# Patient Record
Sex: Male | Born: 1995
Health system: Southern US, Community
[De-identification: ages and names within clinical notes are randomized; demographics above are authoritative.]

## PROBLEM LIST (undated history)

## (undated) DIAGNOSIS — F329 Major depressive disorder, single episode, unspecified: Secondary | ICD-10-CM

## (undated) DIAGNOSIS — F209 Schizophrenia, unspecified: Secondary | ICD-10-CM

## (undated) DIAGNOSIS — D571 Sickle-cell disease without crisis: Secondary | ICD-10-CM

## (undated) DIAGNOSIS — F419 Anxiety disorder, unspecified: Secondary | ICD-10-CM

## (undated) DIAGNOSIS — K8020 Calculus of gallbladder without cholecystitis without obstruction: Secondary | ICD-10-CM

## (undated) HISTORY — PX: CHOLECYSTECTOMY: SHX55

## (undated) HISTORY — PX: ADENOIDECTOMY: SUR15

## (undated) HISTORY — PX: TONSILLECTOMY: SUR1361

---
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
## 2013-01-02 LAB — HEMOGLOBINOPATHY EVALUATION
Hemoglobin Other: 0 %
Hgb A: 0 % — ABNORMAL LOW (ref 96.8–97.8)
Hgb S Quant: 94.4 % — ABNORMAL HIGH

## 2013-01-08 ENCOUNTER — Encounter: Payer: Self-pay | Admitting: Internal Medicine

## 2013-01-08 ENCOUNTER — Ambulatory Visit (INDEPENDENT_AMBULATORY_CARE_PROVIDER_SITE_OTHER): Payer: BC Managed Care – PPO | Admitting: Internal Medicine

## 2013-01-08 VITALS — BP 110/60 | HR 70 | Temp 98.5°F | Resp 14 | Ht 69.0 in | Wt 142.0 lb

## 2013-01-08 DIAGNOSIS — D571 Sickle-cell disease without crisis: Secondary | ICD-10-CM

## 2013-01-08 NOTE — Progress Notes (Signed)
  Subjective:    Patient ID: Nicolas Rogers, male    DOB: [birth year], 17 y.o.   MRN: [id number]  HPI: Pt here today for follow up with labs. He has no complaints. He states that he has been compliant with taking Folic Acid and Hydrea. He still has not had his vision examination.     Review of Systems  Constitutional: Negative.   HENT: Negative.   Eyes: Negative.   Respiratory: Negative.   Cardiovascular: Negative.   Gastrointestinal: Negative.   Endocrine: Negative.   Genitourinary: Negative.   Musculoskeletal: Positive for myalgias and arthralgias.  Skin: Negative.   Allergic/Immunologic: Negative.   Neurological: Negative.   Hematological: Negative.   Psychiatric/Behavioral: Negative.        Objective:   Physical Exam  Constitutional: He is oriented to person, place, and time. He appears well-developed and well-nourished.  HENT:  Head: Atraumatic.  Eyes: Conjunctivae and EOM are normal. Pupils are equal, round, and reactive to light. No scleral icterus.  Neck: Normal range of motion. Neck supple.  Cardiovascular: Normal rate and regular rhythm.  Exam reveals no gallop and no friction rub.   No murmur heard. Pulmonary/Chest: Effort normal and breath sounds normal. He has no wheezes. He has no rales. He exhibits no tenderness.  Abdominal: Soft. Bowel sounds are normal. He exhibits no mass.  Musculoskeletal: Normal range of motion.  Neurological: He is alert and oriented to person, place, and time.  Skin: Skin is warm and dry.  Psychiatric: He has a normal mood and affect. His behavior is normal. Judgment and thought content normal.          Assessment & Plan:  Hb SS: labs reviewed. Pt still has not scheduled his vision examination and states that he needs to speak with is mother as he was seen by an Opthalmologst in the past and wants to follow-up with the same person.  Pt reports that he has been compliant with taking his Folic acid and Hydrea.  It appears that  patient has aq baseline Hb of 7.0-7.5, Hct 20-21, Reticulocyte count 11.9%, Platelets 400  Labs in one month (CBC with diff), U/A  before next office visit.  RTC 2 months.

## 2013-01-23 ENCOUNTER — Encounter: Payer: Self-pay | Admitting: *Deleted

## 2013-01-28 DIAGNOSIS — D571 Sickle-cell disease without crisis: Secondary | ICD-10-CM | POA: Insufficient documentation

## 2013-02-07 ENCOUNTER — Other Ambulatory Visit: Payer: BC Managed Care – PPO

## 2013-02-25 ENCOUNTER — Encounter: Payer: Self-pay | Admitting: *Deleted

## 2013-03-11 ENCOUNTER — Ambulatory Visit: Payer: BC Managed Care – PPO | Admitting: Internal Medicine

## 2013-06-27 ENCOUNTER — Encounter: Payer: Medicaid Other | Admitting: Internal Medicine

## 2013-11-19 ENCOUNTER — Emergency Department (HOSPITAL_COMMUNITY): Payer: BC Managed Care – PPO

## 2013-11-19 ENCOUNTER — Emergency Department (HOSPITAL_COMMUNITY)
Admission: EM | Admit: 2013-11-19 | Discharge: 2013-11-19 | Disposition: A | Discharge status: Home / Self Care | Payer: BC Managed Care – PPO | Attending: Emergency Medicine | Admitting: Emergency Medicine

## 2013-11-19 ENCOUNTER — Encounter (HOSPITAL_COMMUNITY): Payer: Self-pay | Admitting: Emergency Medicine

## 2013-11-19 DIAGNOSIS — N4830 Priapism, unspecified: Secondary | ICD-10-CM | POA: Diagnosis not present

## 2013-11-19 DIAGNOSIS — Z79899 Other long term (current) drug therapy: Secondary | ICD-10-CM | POA: Diagnosis not present

## 2013-11-19 DIAGNOSIS — D571 Sickle-cell disease without crisis: Secondary | ICD-10-CM | POA: Insufficient documentation

## 2013-11-19 DIAGNOSIS — Z8719 Personal history of other diseases of the digestive system: Secondary | ICD-10-CM | POA: Insufficient documentation

## 2013-11-19 LAB — CBC WITH DIFFERENTIAL/PLATELET
Basophils Absolute: 0.1 10*3/uL (ref 0.0–0.1)
Basophils Relative: 1 % (ref 0–1)
EOS PCT: 7 % — AB (ref 0–5)
Eosinophils Absolute: 0.8 10*3/uL — ABNORMAL HIGH (ref 0.0–0.7)
HEMATOCRIT: 20.8 % — AB (ref 39.0–52.0)
Hemoglobin: 7.3 g/dL — ABNORMAL LOW (ref 13.0–17.0)
LYMPHS ABS: 4.5 10*3/uL — AB (ref 0.7–4.0)
LYMPHS PCT: 40 % (ref 12–46)
MCH: 26.7 pg (ref 26.0–34.0)
MCHC: 35.1 g/dL (ref 30.0–36.0)
MCV: 76.2 fL — AB (ref 78.0–100.0)
MONOS PCT: 11 % (ref 3–12)
Monocytes Absolute: 1.2 10*3/uL — ABNORMAL HIGH (ref 0.1–1.0)
NEUTROS ABS: 4.7 10*3/uL (ref 1.7–7.7)
Neutrophils Relative %: 41 % — ABNORMAL LOW (ref 43–77)
PLATELETS: 396 10*3/uL (ref 150–400)
RBC: 2.73 MIL/uL — AB (ref 4.22–5.81)
RDW: 29.5 % — ABNORMAL HIGH (ref 11.5–15.5)
WBC: 11.3 10*3/uL — AB (ref 4.0–10.5)

## 2013-11-19 LAB — COMPREHENSIVE METABOLIC PANEL
ALBUMIN: 4 g/dL (ref 3.5–5.2)
ALK PHOS: 89 U/L (ref 39–117)
ANION GAP: 14 (ref 5–15)
AST: 55 U/L — AB (ref 0–37)
BILIRUBIN TOTAL: 4.9 mg/dL — AB (ref 0.3–1.2)
BUN: 5 mg/dL — AB (ref 6–23)
CHLORIDE: 103 meq/L (ref 96–112)
CO2: 22 mEq/L (ref 19–32)
Calcium: 9 mg/dL (ref 8.4–10.5)
Creatinine, Ser: 0.58 mg/dL (ref 0.50–1.35)
GFR calc Af Amer: >90 mL/min (ref >90)
GFR calc non Af Amer: >90 mL/min (ref >90)
Glucose, Bld: 91 mg/dL (ref 70–99)
POTASSIUM: 4.3 meq/L (ref 3.7–5.3)
SODIUM: 139 meq/L (ref 137–147)
TOTAL PROTEIN: 7.6 g/dL (ref 6.0–8.3)

## 2013-11-19 LAB — RETICULOCYTES
RBC.: 2.73 MIL/uL — ABNORMAL LOW (ref 4.22–5.81)
RETIC COUNT ABSOLUTE: 404 10*3/uL — AB (ref 19.0–186.0)
Retic Ct Pct: 14.8 % — ABNORMAL HIGH (ref 0.4–3.1)

## 2013-11-19 LAB — TYPE AND SCREEN
ABO/RH(D): O POS
ANTIBODY SCREEN: NEGATIVE

## 2013-11-19 LAB — ABO/RH: ABO/RH(D): O POS

## 2013-11-19 MED ORDER — SODIUM CHLORIDE 0.9 % IV SOLN
INTRAVENOUS | Status: DC
  Administered 2013-11-19: 15:00:00 via INTRAVENOUS

## 2013-11-19 MED ORDER — SODIUM CHLORIDE 0.9 % IV BOLUS (SEPSIS)
1000.0000 mL | Freq: Once | INTRAVENOUS | Status: AC
  Administered 2013-11-19: 1000 mL via INTRAVENOUS

## 2013-11-19 NOTE — ED Notes (Addendum)
Pt c/o intermittent priapism x 3 days.  Most recent episode has resolved.  Pt reports episodes every time he falls sleep.  Hx of Sickle Cell Crisis.  Pt was found to have an O2 sat of 87%.  Pt's mother reports that Pt's O2 sat is typically in the high 80s.

## 2013-11-19 NOTE — Discharge Instructions (Signed)
Priapism °Priapism is a persistent, often painful erection. It is the hardening of the penis in males and of the clitoris in females, even without sexual stimulation. Priapism may come on suddenly. Priapism may last a short while, or may last a long time. Priapism occurs in all ages. The types of priapism include: °· Acute prolonged priapism--Priapism that comes on suddenly and lasts. °· Recurrent acute priapism--Priapism that comes on suddenly and tends to happen again. °· Chronic priapism--Priapism that is persistent but with less of an erection. °CAUSES  °There are many causes. Causes include: °· Blood problems common in people with the following diseases: °¨ Sickle cell disease. °¨ Leukemia. °· Side effects of erectile dysfunction medicine. This is the most common cause of priapism. °· Side effects of prescription medicine used in the treatment of depression and anxiety. °· Illegal use of street drugs such as cocaine and marijuana. °· Excessive use of alcohol. °· Neurological problems such as multiple sclerosis. °· Diabetes mellitus. °· The cause may be unknown. °SIGNS AND SYMPTOMS °· A prolonged erection, usually without sexual stimulation or following the use of erectile dysfunction medicine. °· A painful erection. °DIAGNOSIS °Diagnosis of priapism can usually be confirmed by your health care provider after a physical exam. Your health care provider may have blood tests done to search for a potential cause, such as leukemia or sickle cell disease. °TREATMENT  °Treatments depend on the cause. Some specific treatments include: °· Oxygen and red blood cell transfusions in patients with sickle cell disease. °· A special treatment for plasma in those with leukemia. °· Removing blood that is trapped. °· Treatment with medicine. °· Surgical shunting (a passage that is made to allow blood to flow from one part of the body to another). °HOME CARE INFORMATION °· Avoid sexual stimulation and intercourse until your health  care provider says it is okay. °· Avoid the use of alcohol or drugs to minimize recurrence of priapism. °SEEK MEDICAL CARE IF: °· You experience worsening pain instead of improvement. °SEEK IMMEDIATE MEDICAL CARE IF: °· You experience fever or shaking chills. °· You experience pain, swelling, or redness in your genital or groin area. °MAKE SURE YOU: °· Understand these instructions.   °· Will watch your condition. °· Will get help right away if you are not doing well or get worse. °Document Released: 07/16/2003 Document Revised: 02/13/2013 Document Reviewed: 09/27/2012 °ExitCare® Patient Information ©2015 [employer]. This information is not intended to replace advice given to you by your health care provider. Make sure you discuss any questions you have with your health care provider. ° °

## 2013-11-19 NOTE — ED Provider Notes (Signed)
CSN: [id number]     Arrival date & time 11/19/13  1313 History   First MD Initiated Contact with Patient 11/19/13 1401     Chief Complaint  Patient presents with  . Priapism      (Consider location/radiation/quality/duration/timing/severity/associated sxs/prior Treatment) The history is provided by the patient.   Patient here complaining of priapism that has been worse in the morning x2 days. He denies any sickle cell-type pain. He has not had any chest pain or shortness of breath. Symptoms resolved spontaneously. Denies any urinary symptoms. No prior history of this in the past. Patient chronically has a low pulse oximetry. No treatment used prior to arrival. Nothing makes her symptoms better or worse. Past Medical History  Diagnosis Date  . Sickle cell anemia   . Sickle cell anemia   . Cholecystolithiasis    Past Surgical History  Procedure Laterality Date  . Tonsillectomy    . Adenoidectomy     History reviewed. No pertinent family history. History  Substance Use Topics  . Smoking status: Never Smoker   . Smokeless tobacco: Not on file  . Alcohol Use: No    Review of Systems  All other systems reviewed and are negative.     Allergies  Review of patient's allergies indicates no known allergies.  Home Medications   Prior to Admission medications   Medication Sig Start Date End Date Taking? Authorizing Provider  folic acid (FOLVITE) 1 MG tablet Take 1 mg by mouth every morning.    Historical Provider, MD  hydroxyurea (HYDREA) 500 MG capsule Take 1,000 mg by mouth every morning. May take with food to minimize GI side effects.    Historical Provider, MD   BP 117/72  Pulse 71  Temp(Src) 99 F (37.2 C) (Oral)  Resp 16  SpO2 95% Physical Exam  Nursing note and vitals reviewed. Constitutional: He is oriented to person, place, and time. He appears well-developed and well-nourished.  Non-toxic appearance. No distress.  HENT:  Head: Normocephalic and atraumatic.   Eyes: Conjunctivae, EOM and lids are normal. Pupils are equal, round, and reactive to light.  Neck: Normal range of motion. Neck supple. No tracheal deviation present. No mass present.  Cardiovascular: Normal rate, regular rhythm and normal heart sounds.  Exam reveals no gallop.   No murmur heard. Pulmonary/Chest: Effort normal and breath sounds normal. No stridor. No respiratory distress. He has no decreased breath sounds. He has no wheezes. He has no rhonchi. He has no rales.  Abdominal: Soft. Normal appearance and bowel sounds are normal. He exhibits no distension. There is no tenderness. There is no rebound and no CVA tenderness.  Genitourinary: Circumcised. No discharge found.  Flaccid penis.  Musculoskeletal: Normal range of motion. He exhibits no edema and no tenderness.  Neurological: He is alert and oriented to person, place, and time. He has normal strength. No cranial nerve deficit or sensory deficit. GCS eye subscore is 4. GCS verbal subscore is 5. GCS motor subscore is 6.  Skin: Skin is warm and dry. No abrasion and no rash noted.  Psychiatric: He has a normal mood and affect. His speech is normal and behavior is normal.    ED Course  Procedures (including critical care time) Labs Review Labs Reviewed  CBC WITH DIFFERENTIAL - Abnormal; Notable for the following:    WBC 11.3 (*)    RBC 2.73 (*)    Hemoglobin 7.3 (*)    HCT 20.8 (*)    MCV 76.2 (*)  RDW 29.5 (*)    All other components within normal limits  COMPREHENSIVE METABOLIC PANEL - Abnormal; Notable for the following:    BUN 5 (*)    Total Bilirubin 4.9 (*)    All other components within normal limits  RETICULOCYTES - Abnormal; Notable for the following:    Retic Ct Pct 14.8 (*)    RBC. 2.73 (*)    Retic Count, Manual 404.0 (*)    All other components within normal limits  TYPE AND SCREEN  ABO/RH    Imaging Review Dg Chest 2 View  11/19/2013   CLINICAL DATA:  Sickle cell disease.  Low oxygen saturations.   EXAM: CHEST  2 VIEW  COMPARISON:  None.  FINDINGS: Mild central pulmonary vascular prominence without pulmonary edema.  No segmental infiltrate or pneumothorax.  Heart size top-normal.  No acute osseous abnormality detected by plain film exam.  IMPRESSION: Mild central pulmonary vascular prominence without pulmonary edema.  No segmental infiltrate or pneumothorax.  Heart size top-normal.   Electronically Signed   By: Bridgett Larsson M.D.   On: 11/19/2013 14:23     EKG Interpretation None      MDM   Final diagnoses:  Hb-SS disease without crisis  Priapism    Patient's pulse oximetry noted and he states that this is chronic Patient not short of breath at this time. No chest discomfort. Chest x-ray normal. Do not think that this represents pulmonary embolism. No evidence of priapism here. Discussed the case with his primary care Dr. and she will see him in the clinic for followup    Toy Baker, MD 11/19/13 1541

## 2013-11-19 NOTE — ED Notes (Signed)
Pt ambulatory with steady gait. Pt and mother given phone number for sickle cell specialist Marthann SchillerMatthews, Michelle. No other questions/concerns.

## 2013-11-21 ENCOUNTER — Ambulatory Visit: Payer: Medicaid Other | Admitting: Family Medicine

## 2013-12-09 ENCOUNTER — Encounter: Payer: Self-pay | Admitting: Family Medicine

## 2013-12-09 ENCOUNTER — Ambulatory Visit (INDEPENDENT_AMBULATORY_CARE_PROVIDER_SITE_OTHER): Payer: Medicaid Other | Admitting: Family Medicine

## 2013-12-09 VITALS — BP 117/47 | HR 79 | Temp 98.4°F | Resp 16 | Ht 70.5 in | Wt 146.0 lb

## 2013-12-09 DIAGNOSIS — D571 Sickle-cell disease without crisis: Secondary | ICD-10-CM

## 2013-12-09 DIAGNOSIS — Z23 Encounter for immunization: Secondary | ICD-10-CM | POA: Diagnosis not present

## 2013-12-09 MED ORDER — FOLIC ACID 1 MG PO TABS: 1.0000 mg | ORAL_TABLET | Freq: Every morning | ORAL | Status: DC

## 2013-12-09 MED ORDER — IBUPROFEN 600 MG PO TABS: 600.0000 mg | ORAL_TABLET | Freq: Three times a day (TID) | ORAL | Status: DC | PRN

## 2013-12-09 MED ORDER — HYDROXYUREA 500 MG PO CAPS: 1000.0000 mg | ORAL_CAPSULE | Freq: Every morning | ORAL | Status: DC

## 2013-12-09 NOTE — Progress Notes (Signed)
Subjective:    Patient ID: Nicolas Rogers, male    DOB: Feb 27, 1996, 18 y.o.   MRN: [id number]  HPI  [name] is a 18 year old male with HbSS that presents for hospital follow up. He states that he was recently evaluated in the emergency departmentl for a priapism. He states that priapism was in place for less than 2 hours. He reports that he was discharged home and directed to follow up with primary physician. He reports that it has been a year since last follow-up. Patient states that he has not been taking sickle cell maintenance medications consistently. He reports that he has been feeling well without any significant issues. He states that he does not have pain currently.   Past Medical History  Diagnosis Date  . Sickle cell anemia   . Sickle cell anemia   . Cholecystolithiasis    Review of Systems  Constitutional: Negative.   HENT: Negative.   Eyes: Negative.   Respiratory: Negative.   Cardiovascular: Negative.   Gastrointestinal: Negative.   Endocrine: Negative.   Genitourinary: Negative.   Musculoskeletal: Negative.   Skin: Negative.   Allergic/Immunologic: Negative.   Neurological: Negative.   Hematological: Negative.   Psychiatric/Behavioral: Negative.        Objective:   Physical Exam  Constitutional: He is oriented to person, place, and time. He appears well-developed and well-nourished.  HENT:  Head: Normocephalic and atraumatic.  Right Ear: Hearing, tympanic membrane, external ear and ear canal normal.  Left Ear: Hearing, tympanic membrane, external ear and ear canal normal.  Eyes: Conjunctivae and lids are normal. Pupils are equal, round, and reactive to light. Lids are everted and swept, no foreign bodies found.  Neck: Trachea normal and normal range of motion. Neck supple.  Cardiovascular: Normal rate, regular rhythm, normal heart sounds and normal pulses.   Pulmonary/Chest: Effort normal and breath sounds normal.  Abdominal: Soft. Normal appearance and  bowel sounds are normal.  Musculoskeletal: Normal range of motion.  Lymphadenopathy:       Head (right side): No submental and no submandibular adenopathy present.       Head (left side): No submental and no submandibular adenopathy present.       Right cervical: No superficial cervical and no deep cervical adenopathy present.      Left cervical: No superficial cervical and no deep cervical adenopathy present.  Neurological: He is alert and oriented to person, place, and time. He has normal reflexes.  Skin: Skin is warm and dry.  Psychiatric: He has a normal mood and affect. His behavior is normal. Judgment and thought content normal.    BP 117/47  Pulse 79  Temp(Src) 98.4 F (36.9 C) (Oral)  Resp 16  Ht 5' 10.5" (1.791 m)  Wt 146 lb (66.225 kg)  BMI 20.65 kg/m2      Assessment & Plan:  1. Sickle cell disease - Continue Hydrea 1000 daily. We discussed the need for good hydration, monitoring of hydration status, avoidance of heat, cold, stress, and infection triggers. We discussed the risks and benefits of Hydrea, including bone marrow suppression, the possibility of GI upset, skin ulcers, hair thinning, and teratogenicity. The patient was reminded of the need to seek medical attention of any symptoms of bleeding, anemia, or infection. Continue folic acid 1 mg daily to prevent aplastic bone marrow crises. Check vitamin D, CMP, CBC w/diff, and hemoglobinopathy.  Also, ordered Ibuprofen 600 mg every 8 hours for mild to moderate pain and inflammation.  2. Pulmonary evaluation - Patient denies severe recurrent wheezes, shortness of breath with exercise, or persistent cough. If these symptoms develop, pulmonary function tests with spirometry will be ordered, and if abnormal, plan on referral to Pulmonology for further evaluation.  3. Cardiac - Routine screening for pulmonary hypertension is not recommended.  4. Eye - High risk of proliferative retinopathy. Annual eye exam with retinal exam  recommended to patient. Send opthalmology referral  5. Immunization status - Pneumovax received without complication. All other vaccinations up to date.   Cammie Faulstich M RTC: 3 months with Dr. Ashley RoyaltyMatthews for sickle cell follow up Labs: CBC w/ diff, hemoglobinapathy, CMP, and vitamin D Vaccinations: pneumovax Referral: opthalmology  Meds ordered this encounter  Medications  . hydroxyurea (HYDREA) 500 MG capsule    Sig: Take 2 capsules (1,000 mg total) by mouth every morning. May take with food to minimize GI side effects.    Dispense:  60 capsule    Refill:  2    Order Specific Question:  Supervising Provider    Answer:  MATTHEWS, MICHELLE A [[id number]]  . folic acid (FOLVITE) 1 MG tablet    Sig: Take 1 tablet (1 mg total) by mouth every morning.    Dispense:  30 tablet    Refill:  2  . ibuprofen (ADVIL,MOTRIN) 600 MG tablet    Sig: Take 1 tablet (600 mg total) by mouth every 8 (eight) hours as needed.    Dispense:  30 tablet    Refill:  2    Order Specific Question:  Supervising Provider    Answer:  Marthann SchillerMATTHEWS, MICHELLE A [[id number]]

## 2013-12-10 LAB — COMPREHENSIVE METABOLIC PANEL
ALBUMIN: 4.5 g/dL (ref 3.5–5.2)
ALK PHOS: 73 U/L (ref 39–117)
ALT: 14 U/L (ref 0–53)
AST: 54 U/L — AB (ref 0–37)
BUN: 9 mg/dL (ref 6–23)
CALCIUM: 8.8 mg/dL (ref 8.4–10.5)
CHLORIDE: 104 meq/L (ref 96–112)
CO2: 22 mEq/L (ref 19–32)
Creat: 0.68 mg/dL (ref 0.50–1.35)
Glucose, Bld: 89 mg/dL (ref 70–99)
POTASSIUM: 4.5 meq/L (ref 3.5–5.3)
Sodium: 135 mEq/L (ref 135–145)
Total Bilirubin: 5.4 mg/dL — ABNORMAL HIGH (ref 0.2–1.2)
Total Protein: 7.6 g/dL (ref 6.0–8.3)

## 2013-12-10 LAB — CBC WITH DIFFERENTIAL/PLATELET
BASOS ABS: 0.1 10*3/uL (ref 0.0–0.1)
BASOS PCT: 1 % (ref 0–1)
Eosinophils Absolute: 1.2 10*3/uL — ABNORMAL HIGH (ref 0.0–0.7)
Eosinophils Relative: 9 % — ABNORMAL HIGH (ref 0–5)
HCT: 20.2 % — ABNORMAL LOW (ref 39.0–52.0)
Hemoglobin: 7.1 g/dL — ABNORMAL LOW (ref 13.0–17.0)
LYMPHS PCT: 42 % (ref 12–46)
Lymphs Abs: 5.5 10*3/uL — ABNORMAL HIGH (ref 0.7–4.0)
MCH: 26 pg (ref 26.0–34.0)
MCHC: 35.1 g/dL (ref 30.0–36.0)
MCV: 74 fL — ABNORMAL LOW (ref 78.0–100.0)
MONO ABS: 1.2 10*3/uL — AB (ref 0.1–1.0)
Monocytes Relative: 9 % (ref 3–12)
NEUTROS ABS: 5.1 10*3/uL (ref 1.7–7.7)
NEUTROS PCT: 39 % — AB (ref 43–77)
PLATELETS: 439 10*3/uL — AB (ref 150–400)
RBC: 2.73 MIL/uL — ABNORMAL LOW (ref 4.22–5.81)
RDW: 27.9 % — AB (ref 11.5–15.5)
WBC: 13 10*3/uL — AB (ref 4.0–10.5)

## 2013-12-10 LAB — VITAMIN D 25 HYDROXY (VIT D DEFICIENCY, FRACTURES): Vit D, 25-Hydroxy: 17 ng/mL — ABNORMAL LOW (ref 30–89)

## 2013-12-11 LAB — HEMOGLOBINOPATHY EVALUATION
HGB A2 QUANT: 3 % (ref 2.2–3.2)
HGB F QUANT: 2.4 % — AB (ref 0.0–2.0)
Hemoglobin Other: 0 %
Hgb A: 0 % — ABNORMAL LOW (ref 96.8–97.8)
Hgb S Quant: 94.6 % — ABNORMAL HIGH

## 2013-12-19 ENCOUNTER — Emergency Department (HOSPITAL_COMMUNITY)
Admission: EM | Admit: 2013-12-19 | Discharge: 2013-12-19 | Disposition: A | Discharge status: Home / Self Care | Payer: BC Managed Care – PPO | Attending: Emergency Medicine | Admitting: Emergency Medicine

## 2013-12-19 ENCOUNTER — Telehealth (HOSPITAL_COMMUNITY): Payer: Self-pay | Admitting: *Deleted

## 2013-12-19 ENCOUNTER — Encounter (HOSPITAL_COMMUNITY): Payer: Self-pay | Admitting: Emergency Medicine

## 2013-12-19 DIAGNOSIS — Z791 Long term (current) use of non-steroidal anti-inflammatories (NSAID): Secondary | ICD-10-CM | POA: Insufficient documentation

## 2013-12-19 DIAGNOSIS — N4830 Priapism, unspecified: Secondary | ICD-10-CM | POA: Diagnosis not present

## 2013-12-19 DIAGNOSIS — Z8719 Personal history of other diseases of the digestive system: Secondary | ICD-10-CM | POA: Diagnosis not present

## 2013-12-19 DIAGNOSIS — D571 Sickle-cell disease without crisis: Secondary | ICD-10-CM | POA: Diagnosis not present

## 2013-12-19 MED ORDER — PHENYLEPHRINE 200 MCG/ML FOR PRIAPISM / HYPOTENSION: 50.0000 ug | Freq: Once | INTRAMUSCULAR | Status: DC

## 2013-12-19 NOTE — ED Notes (Signed)
Pt here for priapism d/t sickle cell.  Mom states this happens 2-3 times a week.  States that it went back down on his way to the hospital.  Wants to know if there is anything that can prevent it from happening again.

## 2013-12-19 NOTE — ED Provider Notes (Signed)
CSN: [id number]     Arrival date & time 12/19/13  1217 History   First MD Initiated Contact with Patient 12/19/13 1235     Chief Complaint  Patient presents with  . Priapism       HPI Patient presents the emergency department for possible priapism.  He has a history of sickle cell disease.  He states that today he has had intermittent direction and will last longer than normal but it does seem to resolve on its own.  He states his erection resolved on the way to the emergency department today.  He states that when he has erection it is painful.  It lasts anywhere from 20-30 minutes.  He states this mainly occurs when he awakens from sleep and then resolves on its own.  He denies use of pornography or other significant sexual stimulation.  Denies persistent masturbation.  Denies use of Viagra or other erectile dysfunction medications.  Denies drug abuse.   Past Medical History  Diagnosis Date  . Sickle cell anemia   . Sickle cell anemia   . Cholecystolithiasis    Past Surgical History  Procedure Laterality Date  . Tonsillectomy    . Adenoidectomy     No family history on file. History  Substance Use Topics  . Smoking status: Never Smoker   . Smokeless tobacco: Not on file  . Alcohol Use: No    Review of Systems  All other systems reviewed and are negative.     Allergies  Review of patient's allergies indicates no known allergies.  Home Medications   Prior to Admission medications   Medication Sig Start Date End Date Taking? Authorizing Provider  folic acid (FOLVITE) 1 MG tablet Take 1 tablet (1 mg total) by mouth every morning. 12/09/13  Yes Massie MaroonLachina M Hollis, FNP  hydroxyurea (HYDREA) 500 MG capsule Take 2 capsules (1,000 mg total) by mouth every morning. May take with food to minimize GI side effects. 12/09/13  Yes Massie MaroonLachina M Hollis, FNP  ibuprofen (ADVIL,MOTRIN) 600 MG tablet Take 1 tablet (600 mg total) by mouth every 8 (eight) hours as needed. 12/09/13  Yes Massie MaroonLachina M  Hollis, FNP   BP 98/57  Pulse 76  Temp(Src) 97.8 F (36.6 C) (Oral)  Resp 16  SpO2 91% Physical Exam  Nursing note and vitals reviewed. Constitutional: He is oriented to person, place, and time. He appears well-developed and well-nourished.  HENT:  Head: Normocephalic and atraumatic.  Eyes: EOM are normal.  Neck: Normal range of motion.  Cardiovascular: Normal rate, regular rhythm, normal heart sounds and intact distal pulses.   Pulmonary/Chest: Effort normal and breath sounds normal. No respiratory distress.  Abdominal: Soft. He exhibits no distension. There is no tenderness.  Musculoskeletal: Normal range of motion.  Neurological: He is alert and oriented to person, place, and time.  Skin: Skin is warm and dry.  Psychiatric: He has a normal mood and affect. Judgment normal.    ED Course  Procedures (including critical care time) Labs Review Labs Reviewed - No data to display  Imaging Review No results found.   EKG Interpretation None      MDM   Final diagnoses:  Painful erection    I am not necessarily convinced this is true priapism.  I will refer for the patient to urology.  Complete resolution of his erection at this time.  Discharge home in good condition.    Lyanne CoKevin M Markeesha Char, MD 12/19/13 [phone]

## 2013-12-19 NOTE — Telephone Encounter (Signed)
Returned call. Per MD, informed Patient's mother to take patient to the ED as soon as possible for evaluation. Mother acknowledges and states to take patient to ED now.

## 2013-12-19 NOTE — ED Notes (Addendum)
Pt c/o priapism over the past month.  States it occurs more often upon waking in the morning.  Occurs 2-3 times a week.    Pt believes it may be related to sickle cell.  Episodes last about 20 min and can become painful.  Denies any other symptoms.  Pt unable to think of any precipitating factors for episodes.  No acute distress, resp equal and unlabored, skin warm and dry.

## 2013-12-19 NOTE — Telephone Encounter (Signed)
Received call from Wilber BihariShakima Simmons in reference to her son Bebe LiterKewante Vandehei, caller states patient is experiencing priapism and is questioning if should come to clinic or ED. This RN requests to speak with the patient for further assessment. Patient reports he has history of intermittent priapism for approximately 3 days out of the week; reports priapism is present upon awaken and will go away shortly afterwards. Patient gives phone to his mother and mother states the patient does not understand and is unsure how to answer the questions concerning his priapism. Per Patient's mother, Patient has been experiencing priapism for maybe 3 hours off and on with this current continuous episode lasting about 30 minutes to an hour. Patient is unsure of time as he has been asleep. Patient reports pain 5/10 and has taken nothing for pain. Patient unsure if priapism is getting better or worse at this time. Informed patient's mother that this RN will notify provider on call and patient is to receive a return call back with recommendation. Patient's mother acknowledges.

## 2013-12-23 ENCOUNTER — Telehealth: Payer: Self-pay | Admitting: Family Medicine

## 2013-12-23 ENCOUNTER — Telehealth: Payer: Self-pay

## 2013-12-23 MED ORDER — ERGOCALCIFEROL 1.25 MG (50000 UT) PO CAPS: 50000.0000 [IU] | ORAL_CAPSULE | ORAL | Status: DC

## 2013-12-23 NOTE — Telephone Encounter (Signed)
Meds ordered this encounter  Medications  . ergocalciferol (DRISDOL) 50000 UNITS capsule    Sig: Take 1 capsule (50,000 Units total) by mouth once a week.    Dispense:  4 capsule    Refill:  12    Order Specific Question:  Supervising Provider    Answer:  MATTHEWS, MICHELLE A [[id number]  Will start Drisdol 500000 units weekly, will re-evaluate at 3 month follow up

## 2013-12-23 NOTE — Telephone Encounter (Signed)
Called and advised patient of low vitamin D levels, that rx had been sent to pharmacy to take 1 capsule once a week and we will follow up on this at his 3 month appointment with us. Patient verbally states understanding. Thanks!

## 2014-01-03 ENCOUNTER — Telehealth (HOSPITAL_COMMUNITY): Payer: Self-pay | Admitting: *Deleted

## 2014-01-03 NOTE — Telephone Encounter (Signed)
Message left Notified patient of [facility] on Sept. 18,2015.

## 2014-03-11 ENCOUNTER — Ambulatory Visit: Payer: Medicaid Other | Admitting: Family Medicine

## 2014-03-12 ENCOUNTER — Encounter: Payer: Self-pay | Admitting: Family Medicine

## 2014-03-12 ENCOUNTER — Ambulatory Visit (INDEPENDENT_AMBULATORY_CARE_PROVIDER_SITE_OTHER): Payer: BC Managed Care – PPO | Admitting: Family Medicine

## 2014-03-12 VITALS — BP 111/45 | HR 67 | Temp 99.1°F | Resp 16 | Ht 71.0 in | Wt 148.0 lb

## 2014-03-12 DIAGNOSIS — D571 Sickle-cell disease without crisis: Secondary | ICD-10-CM

## 2014-03-12 DIAGNOSIS — Z23 Encounter for immunization: Secondary | ICD-10-CM

## 2014-03-12 DIAGNOSIS — E559 Vitamin D deficiency, unspecified: Secondary | ICD-10-CM | POA: Insufficient documentation

## 2014-03-12 LAB — COMPLETE METABOLIC PANEL WITH GFR
ALK PHOS: 62 U/L (ref 39–117)
ALT: 16 U/L (ref 0–53)
AST: 57 U/L — ABNORMAL HIGH (ref 0–37)
Albumin: 4.4 g/dL (ref 3.5–5.2)
BILIRUBIN TOTAL: 4.4 mg/dL — AB (ref 0.2–1.2)
BUN: 5 mg/dL — AB (ref 6–23)
CO2: 26 mEq/L (ref 19–32)
CREATININE: 0.72 mg/dL (ref 0.50–1.35)
Calcium: 9.1 mg/dL (ref 8.4–10.5)
Chloride: 106 mEq/L (ref 96–112)
GLUCOSE: 90 mg/dL (ref 70–99)
Potassium: 4.7 mEq/L (ref 3.5–5.3)
SODIUM: 138 meq/L (ref 135–145)
Total Protein: 7.4 g/dL (ref 6.0–8.3)

## 2014-03-12 MED ORDER — ERGOCALCIFEROL 1.25 MG (50000 UT) PO CAPS: 50000.0000 [IU] | ORAL_CAPSULE | ORAL | Status: DC

## 2014-03-12 NOTE — Progress Notes (Signed)
   Subjective:    Patient ID: Nicolas Rogers, male    DOB: Sep 26, 1995, 18 y.o.   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   He reports that he feels well and is without current complaint.  He is taking medications consistently.He denies fever, chest pain, shortness of breath, nausea, vomiting, or diarrhea.   Past Medical History  Diagnosis Date  . Sickle cell anemia   . Sickle cell anemia   . Cholecystolithiasis      Review of Systems  Constitutional: Negative.   HENT: Negative.   Eyes: Negative.   Respiratory: Negative.   Cardiovascular: Negative.   Gastrointestinal: Negative.   Endocrine: Negative.   Genitourinary: Negative.   Musculoskeletal: Negative.   Skin: Negative.   Allergic/Immunologic: Negative.   Neurological: Negative.   Hematological: Negative.   Psychiatric/Behavioral: Negative.        Objective:   Physical Exam  Constitutional: He is oriented to person, place, and time. He appears well-developed and well-nourished.  HENT:  Head: Normocephalic and atraumatic.  Right Ear: External ear normal.  Left Ear: External ear normal.  Mouth/Throat: Oropharynx is clear and moist.  Eyes: Conjunctivae are normal. Pupils are equal, round, and reactive to light. Right eye exhibits no discharge. Left eye exhibits no discharge. Scleral icterus is present.  Neck: Normal range of motion. Neck supple.  Pulmonary/Chest: Effort normal and breath sounds normal.  Abdominal: Soft. Bowel sounds are normal.  Musculoskeletal: Normal range of motion. He exhibits no edema or tenderness.  Neurological: He is alert and oriented to person, place, and time.  Skin: Skin is warm and dry.  Psychiatric: He has a normal mood and affect. His behavior is normal. Judgment and thought content normal.         BP 111/45 mmHg  Pulse 67  Temp(Src) 99.1 F (37.3 C) (Oral)  Resp 16  Ht 5\' 11"   (1.803 m)  Wt 148 lb (67.132 kg)  BMI 20.65 kg/m2 Assessment & Plan:  1. Hb-SS disease without crisis Continue Hydrea 500 mg twice daily. We discussed the need for good hydration, monitoring of hydration status, avoidance of heat, cold, stress, and infection triggers. We discussed the risks and benefits of Hydrea, including bone marrow suppression, the possibility of GI upset, skin ulcers, hair thinning, and teratogenicity. The patient was reminded of the need to seek medical attention of any symptoms of bleeding, anemia, or infection. Continue folic acid 1 mg daily to prevent aplastic bone marrow crises.   He does not have a hematologist. We will need to send a referral.   Eye - High risk of proliferative retinopathy. Annual eye exam with retinal exam recommended to patient. Will send a referral.  - CBC with Differential - COMPLETE METABOLIC PANEL WITH GFR - Ambulatory referral to Ophthalmology  2. Vitamin D deficiency He did not pick up previous Rx for vitamin D deficiency. Discussed at length.   - ergocalciferol (DRISDOL) 50000 UNITS capsule; Take 1 capsule (50,000 Units total) by mouth once a week.  Dispense: 4 capsule; Refill: 12  3. Need for immunization against influenza - Flu Vaccine QUAD 36+ mos IM (Fluarix)   Ebelyn Bohnet M, FNP

## 2014-03-13 LAB — CBC WITH DIFFERENTIAL/PLATELET
BASOS PCT: 1 % (ref 0–1)
Basophils Absolute: 0.1 10*3/uL (ref 0.0–0.1)
EOS ABS: 0.7 10*3/uL (ref 0.0–0.7)
EOS PCT: 5 % (ref 0–5)
HCT: 19.6 % — ABNORMAL LOW (ref 39.0–52.0)
HEMOGLOBIN: 6.9 g/dL — AB (ref 13.0–17.0)
Lymphocytes Relative: 47 % — ABNORMAL HIGH (ref 12–46)
Lymphs Abs: 6.2 10*3/uL — ABNORMAL HIGH (ref 0.7–4.0)
MCH: 27.5 pg (ref 26.0–34.0)
MCHC: 35.2 g/dL (ref 30.0–36.0)
MCV: 78.1 fL (ref 78.0–100.0)
MONO ABS: 1 10*3/uL (ref 0.1–1.0)
MONOS PCT: 8 % (ref 3–12)
NEUTROS PCT: 39 % — AB (ref 43–77)
Neutro Abs: 5.1 10*3/uL (ref 1.7–7.7)
Platelets: 427 10*3/uL — ABNORMAL HIGH (ref 150–400)
RBC: 2.51 MIL/uL — ABNORMAL LOW (ref 4.22–5.81)
RDW: 28.1 % — ABNORMAL HIGH (ref 11.5–15.5)
WBC: 13.1 10*3/uL — ABNORMAL HIGH (ref 4.0–10.5)

## 2014-06-12 ENCOUNTER — Ambulatory Visit: Payer: BC Managed Care – PPO | Admitting: Family Medicine

## 2014-07-29 ENCOUNTER — Encounter: Payer: Self-pay | Admitting: Family Medicine

## 2014-07-29 ENCOUNTER — Ambulatory Visit (INDEPENDENT_AMBULATORY_CARE_PROVIDER_SITE_OTHER): Payer: BLUE CROSS/BLUE SHIELD | Admitting: Family Medicine

## 2014-07-29 ENCOUNTER — Other Ambulatory Visit: Payer: Self-pay | Admitting: Family Medicine

## 2014-07-29 VITALS — BP 115/71 | HR 79 | Temp 98.7°F | Resp 14 | Ht 71.0 in | Wt 144.0 lb

## 2014-07-29 DIAGNOSIS — D571 Sickle-cell disease without crisis: Secondary | ICD-10-CM | POA: Diagnosis not present

## 2014-07-29 DIAGNOSIS — E559 Vitamin D deficiency, unspecified: Secondary | ICD-10-CM | POA: Diagnosis not present

## 2014-07-29 MED ORDER — HYDROXYUREA 500 MG PO CAPS: 1000.0000 mg | ORAL_CAPSULE | Freq: Every morning | ORAL | Status: DC

## 2014-07-29 MED ORDER — FOLIC ACID 1 MG PO TABS: 1.0000 mg | ORAL_TABLET | Freq: Every morning | ORAL | Status: DC

## 2014-07-29 MED ORDER — ERGOCALCIFEROL 1.25 MG (50000 UT) PO CAPS: 50000.0000 [IU] | ORAL_CAPSULE | ORAL | Status: DC

## 2014-07-29 NOTE — Patient Instructions (Addendum)
Sickle Cell Anemia, Adult Sickle cell anemia is a condition in which red blood cells have an abnormal "sickle" shape. This abnormal shape shortens the cells' life span, which results in a lower than normal concentration of red blood cells in the blood. The sickle shape also causes the cells to clump together and block free blood flow through the blood vessels. As a result, the tissues and organs of the body do not receive enough oxygen. Sickle cell anemia causes organ damage and pain and increases the risk of infection. CAUSES  Sickle cell anemia is a genetic disorder. Those who receive two copies of the gene have the condition, and those who receive one copy have the trait. RISK FACTORS The sickle cell gene is most common in people whose families originated in [place]. Other areas of the globe where sickle cell trait occurs include the Mediterranean, [place], the [place], and the [place].  SIGNS AND SYMPTOMS  Pain, especially in the extremities, back, chest, or abdomen (common). The pain may start suddenly or may develop following an illness, especially if there is dehydration. Pain can also occur due to overexertion or exposure to extreme temperature changes.  Frequent severe bacterial infections, especially certain types of pneumonia and meningitis.  Pain and swelling in the hands and feet.  Decreased activity.   Loss of appetite.   Change in behavior.  Headaches.  Seizures.  Shortness of breath or difficulty breathing.  Vision changes.  Skin ulcers. Those with the trait may not have symptoms or they may have mild symptoms.  DIAGNOSIS  Sickle cell anemia is diagnosed with blood tests that demonstrate the genetic trait. It is often diagnosed during the newborn period, due to mandatory testing nationwide. A variety of blood tests, X-rays, CT scans, MRI scans, ultrasounds, and lung function tests may also be done to monitor the condition. TREATMENT  Sickle  cell anemia may be treated with:  Medicines. You may be given pain medicines, antibiotic medicines (to treat and prevent infections) or medicines to increase the production of certain types of hemoglobin.  Fluids.  Oxygen.  Blood transfusions. HOME CARE INSTRUCTIONS   Drink enough fluid to keep your urine clear or pale yellow. Increase your fluid intake in hot weather and during exercise.  Do not smoke. Smoking lowers oxygen levels in the blood.   Only take over-the-counter or prescription medicines for pain, fever, or discomfort as directed by your health care provider.  Take antibiotics as directed by your health care provider. Make sure you finish them it even if you start to feel better.   Take supplements as directed by your health care provider.   Consider wearing a medical alert bracelet. This tells anyone caring for you in an emergency of your condition.   When traveling, keep your medical information, health care provider's names, and the medicines you take with you at all times.   If you develop a fever, do not take medicines to reduce the fever right away. This could cover up a problem that is developing. Notify your health care provider.  Keep all follow-up appointments with your health care provider. Sickle cell anemia requires regular medical care. SEEK MEDICAL CARE IF: You have a fever. SEEK IMMEDIATE MEDICAL CARE IF:   You feel dizzy or faint.   You have new abdominal pain, especially on the left side near the stomach area.   You develop a persistent, often uncomfortable and painful penile erection (priapism). If this is not treated immediately it   will lead to impotence.   You have numbness your arms or legs or you have a hard time moving them.   You have a hard time with speech.   You have a fever or persistent symptoms for more than 2-3 days.   You have a fever and your symptoms suddenly get worse.   You have signs or symptoms of infection.  These include:   Chills.   Abnormal tiredness (lethargy).   Irritability.   Poor eating.   Vomiting.   You develop pain that is not helped with medicine.   You develop shortness of breath.  You have pain in your chest.   You are coughing up pus-like or bloody sputum.   You develop a stiff neck.  Your feet or hands swell or have pain.  Your abdomen appears bloated.  You develop joint pain. MAKE SURE YOU:  Understand these instructions. Document Released: 08/03/2005 Document Revised: 09/09/2013 Document Reviewed: 12/05/2012 [facility] Patient Information 2015 [place], [place]. This information is not intended to replace advice given to you by your health care provider. Make sure you discuss any questions you have with your health care provider. Sickle Cell Anemia, Adult Sickle cell anemia is a condition in which red blood cells have an abnormal "sickle" shape. This abnormal shape shortens the cells' life span, which results in a lower than normal concentration of red blood cells in the blood. The sickle shape also causes the cells to clump together and block free blood flow through the blood vessels. As a result, the tissues and organs of the body do not receive enough oxygen. Sickle cell anemia causes organ damage and pain and increases the risk of infection. CAUSES  Sickle cell anemia is a genetic disorder. Those who receive two copies of the gene have the condition, and those who receive one copy have the trait. RISK FACTORS The sickle cell gene is most common in people whose families originated in [place]. Other areas of the globe where sickle cell trait occurs include the [place], [place] and [place], and the [place].  SIGNS AND SYMPTOMS  Pain, especially in the extremities, back, chest, or abdomen (common). The pain may start suddenly or may develop following an illness, especially if there is dehydration. Pain can also occur due to  overexertion or exposure to extreme temperature changes.  Frequent severe bacterial infections, especially certain types of pneumonia and meningitis.  Pain and swelling in the hands and feet.  Decreased activity.   Loss of appetite.   Change in behavior.  Headaches.  Seizures.  Shortness of breath or difficulty breathing.  Vision changes.  Skin ulcers. Those with the trait may not have symptoms or they may have mild symptoms.  DIAGNOSIS  Sickle cell anemia is diagnosed with blood tests that demonstrate the genetic trait. It is often diagnosed during the newborn period, due to mandatory testing nationwide. A variety of blood tests, X-rays, CT scans, MRI scans, ultrasounds, and lung function tests may also be done to monitor the condition. TREATMENT  Sickle cell anemia may be treated with:  Medicines. You may be given pain medicines, antibiotic medicines (to treat and prevent infections) or medicines to increase the production of certain types of hemoglobin.  Fluids.  Oxygen.  Blood transfusions. HOME CARE INSTRUCTIONS   Drink enough fluid to keep your urine clear or pale yellow. Increase your fluid intake in hot weather and during exercise.  Do not smoke. Smoking lowers oxygen levels in the blood.  Only take over-the-counter or prescription medicines for pain, fever, or discomfort as directed by your health care provider.  Take antibiotics as directed by your health care provider. Make sure you finish them it even if you start to feel better.   Take supplements as directed by your health care provider.   Consider wearing a medical alert bracelet. This tells anyone caring for you in an emergency of your condition.   When traveling, keep your medical information, health care provider's names, and the medicines you take with you at all times.   If you develop a fever, do not take medicines to reduce the fever right away. This could cover up a problem that is  developing. Notify your health care provider.  Keep all follow-up appointments with your health care provider. Sickle cell anemia requires regular medical care. SEEK MEDICAL CARE IF: You have a fever. SEEK IMMEDIATE MEDICAL CARE IF:   You feel dizzy or faint.   You have new abdominal pain, especially on the left side near the stomach area.   You develop a persistent, often uncomfortable and painful penile erection (priapism). If this is not treated immediately it will lead to impotence.   You have numbness your arms or legs or you have a hard time moving them.   You have a hard time with speech.   You have a fever or persistent symptoms for more than 2-3 days.   You have a fever and your symptoms suddenly get worse.   You have signs or symptoms of infection. These include:   Chills.   Abnormal tiredness (lethargy).   Irritability.   Poor eating.   Vomiting.   You develop pain that is not helped with medicine.   You develop shortness of breath.  You have pain in your chest.   You are coughing up pus-like or bloody sputum.   You develop a stiff neck.  Your feet or hands swell or have pain.  Your abdomen appears bloated.  You develop joint pain. MAKE SURE YOU:  Understand these instructions. Document Released: 08/03/2005 Document Revised: 09/09/2013 Document Reviewed: 12/05/2012 [facility] Patient Information 2015 [place], [place]. This information is not intended to replace advice given to you by your health care provider. Make sure you discuss any questions you have with your health care provider. Vitamin D Deficiency Vitamin D is an important vitamin that your body needs. Having too little of it in your body is called a deficiency. A very bad deficiency can make your bones soft and can cause a condition called rickets.  Vitamin D is important to your body for different reasons, such as:   It helps your body absorb 2 minerals called calcium and  phosphorus.  It helps make your bones healthy.  It may prevent some diseases, such as diabetes and multiple sclerosis.  It helps your muscles and heart. You can get vitamin D in several ways. It is a natural part of some foods. The vitamin is also added to some dairy products and cereals. Some people take vitamin D supplements. Also, your body makes vitamin D when you are in the sun. It changes the sun's rays into a form of the vitamin that your body can use. CAUSES   Not eating enough foods that contain vitamin D.  Not getting enough sunlight.  Having certain digestive system diseases that make it hard to absorb vitamin D. These diseases include Crohn's disease, chronic pancreatitis, and cystic fibrosis.  Having a surgery in which part of the  stomach or small intestine is removed.  Being obese. Fat cells pull vitamin D out of your blood. That means that obese people may not have enough vitamin D left in their blood and in other body tissues.  Having chronic kidney or liver disease. RISK FACTORS Risk factors are things that make you more likely to develop a vitamin D deficiency. They include:  Being older.  Not being able to get outside very much.  Living in a nursing home.  Having had broken bones.  Having weak or thin bones (osteoporosis).  Having a disease or condition that changes how your body absorbs vitamin D.  Having dark skin.  Some medicines such as seizure medicines or steroids.  Being overweight or obese. SYMPTOMS Mild cases of vitamin D deficiency may not have any symptoms. If you have a very bad case, symptoms may include:  Bone pain.  Muscle pain.  Falling often.  Broken bones caused by a minor injury, due to osteoporosis. DIAGNOSIS A blood test is the best way to tell if you have a vitamin D deficiency. TREATMENT Vitamin D deficiency can be treated in different ways. Treatment for vitamin D deficiency depends on what is causing it. Options  include:  Taking vitamin D supplements.  Taking a calcium supplement. Your caregiver will suggest what dose is best for you. HOME CARE INSTRUCTIONS  Take any supplements that your caregiver prescribes. Follow the directions carefully. Take only the suggested amount.  Have your blood tested 2 months after you start taking supplements.  Eat foods that contain vitamin D. Healthy choices include:  Fortified dairy products, cereals, or juices. Fortified means vitamin D has been added to the food. Check the label on the package to be sure.  Fatty fish like salmon or trout.  Eggs.  Oysters.  Do not use a tanning bed.  Keep your weight at a healthy level. Lose weight if you need to.  Keep all follow-up appointments. Your caregiver will need to perform blood tests to make sure your vitamin D deficiency is going away. SEEK MEDICAL CARE IF:  You have any questions about your treatment.  You continue to have symptoms of vitamin D deficiency.  You have nausea or vomiting.  You are constipated.  You feel confused.  You have severe abdominal or back pain. MAKE SURE YOU:  Understand these instructions.  Will watch your condition.  Will get help right away if you are not doing well or get worse. Document Released: 07/18/2011 Document Revised: 08/20/2012 Document Reviewed: 07/18/2011 [facility] Patient Information 2015 [place]. This information is not intended to replace advice given to you by your health care provider. Make sure you discuss any questions you have with your health care provider. Vitamin D Deficiency Vitamin D is an important vitamin that your body needs. Having too little of it in your body is called a deficiency. A very bad deficiency can make your bones soft and can cause a condition called rickets.  Vitamin D is important to your body for different reasons, such as:   It helps your body absorb 2 minerals called calcium and phosphorus.  It helps make your  bones healthy.  It may prevent some diseases, such as diabetes and multiple sclerosis.  It helps your muscles and heart. You can get vitamin D in several ways. It is a natural part of some foods. The vitamin is also added to some dairy products and cereals. Some people take vitamin D supplements. Also, your body makes vitamin D  when you are in the sun. It changes the sun's rays into a form of the vitamin that your body can use. CAUSES   Not eating enough foods that contain vitamin D.  Not getting enough sunlight.  Having certain digestive system diseases that make it hard to absorb vitamin D. These diseases include Crohn's disease, chronic pancreatitis, and cystic fibrosis.  Having a surgery in which part of the stomach or small intestine is removed.  Being obese. Fat cells pull vitamin D out of your blood. That means that obese people may not have enough vitamin D left in their blood and in other body tissues.  Having chronic kidney or liver disease. RISK FACTORS Risk factors are things that make you more likely to develop a vitamin D deficiency. They include:  Being older.  Not being able to get outside very much.  Living in a nursing home.  Having had broken bones.  Having weak or thin bones (osteoporosis).  Having a disease or condition that changes how your body absorbs vitamin D.  Having dark skin.  Some medicines such as seizure medicines or steroids.  Being overweight or obese. SYMPTOMS Mild cases of vitamin D deficiency may not have any symptoms. If you have a very bad case, symptoms may include:  Bone pain.  Muscle pain.  Falling often.  Broken bones caused by a minor injury, due to osteoporosis. DIAGNOSIS A blood test is the best way to tell if you have a vitamin D deficiency. TREATMENT Vitamin D deficiency can be treated in different ways. Treatment for vitamin D deficiency depends on what is causing it. Options include:  Taking vitamin D  supplements.  Taking a calcium supplement. Your caregiver will suggest what dose is best for you. HOME CARE INSTRUCTIONS  Take any supplements that your caregiver prescribes. Follow the directions carefully. Take only the suggested amount.  Have your blood tested 2 months after you start taking supplements.  Eat foods that contain vitamin D. Healthy choices include:  Fortified dairy products, cereals, or juices. Fortified means vitamin D has been added to the food. Check the label on the package to be sure.  Fatty fish like salmon or trout.  Eggs.  Oysters.  Do not use a tanning bed.  Keep your weight at a healthy level. Lose weight if you need to.  Keep all follow-up appointments. Your caregiver will need to perform blood tests to make sure your vitamin D deficiency is going away. SEEK MEDICAL CARE IF:  You have any questions about your treatment.  You continue to have symptoms of vitamin D deficiency.  You have nausea or vomiting.  You are constipated.  You feel confused.  You have severe abdominal or back pain. MAKE SURE YOU:  Understand these instructions.  Will watch your condition.  Will get help right away if you are not doing well or get worse. Document Released: 07/18/2011 Document Revised: 08/20/2012 Document Reviewed: 07/18/2011 [facility] Patient Information 2015 [place], [place]. This information is not intended to replace advice given to you by your health care provider. Make sure you discuss any questions you have with your health care provider.

## 2014-07-29 NOTE — Progress Notes (Signed)
Subjective:    Patient ID: Nicolas Rogers, male    DOB: 1996/02/23, 19 y.o.   MRN: [id number]  HPI Mr. 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 He reports that he feels well and is without current complaint.   He is taking medications consistently.He denies fever, chest pain, shortness of breath, nausea, vomiting, or diarrhea.   Past Medical History  Diagnosis Date  . Sickle cell anemia   . Sickle cell anemia   . Cholecystolithiasis    History   Social History  . Marital Status: Single    Spouse Name: N/A  . Number of Children: N/A  . Years of Education: N/A   Occupational History  . Not on file.   Social History Main Topics  . Smoking status: Never Smoker   . Smokeless tobacco: Not on file  . Alcohol Use: No  . Drug Use: No  . Sexual Activity: Not on file   Other Topics Concern  . Not on file   Social History Narrative  No Known Allergies  Review of Systems  Constitutional: Negative.  Negative for fever and fatigue.  HENT: Negative.   Eyes: Negative.   Respiratory: Negative.   Cardiovascular: Negative.   Gastrointestinal: Negative for nausea, diarrhea and constipation.  Endocrine: Negative.   Genitourinary: Negative.   Musculoskeletal: Negative.   Skin: Negative.        Acne to face- open and closed comedones  Allergic/Immunologic: Negative.   Neurological: Negative.   Hematological: Negative.   Psychiatric/Behavioral: Negative.        Objective:   Physical Exam  Constitutional: He is oriented to person, place, and time. He appears well-developed and well-nourished.  HENT:  Head: Normocephalic and atraumatic.  Right Ear: External ear normal.  Left Ear: External ear normal.  Mouth/Throat: Oropharynx is clear and moist.  Eyes: Conjunctivae are normal. Pupils are equal, round, and reactive to light. Right eye exhibits no discharge. Left eye exhibits no discharge.  Neck: Normal range of motion.  Neck supple.  Pulmonary/Chest: Effort normal and breath sounds normal.  Abdominal: Soft. Bowel sounds are normal.  Musculoskeletal: Normal range of motion. He exhibits no edema or tenderness.  Neurological: He is alert and oriented to person, place, and time.  Skin: Skin is warm and dry.  Psychiatric: He has a normal mood and affect. His behavior is normal. Judgment and thought content normal.      BP 115/71 mmHg  Pulse 79  Temp(Src) 98.7 F (37.1 C) (Oral)  Resp 14  Ht 5\' 11"  (1.803 m)  Wt 144 lb (65.318 kg)  BMI 20.09 kg/m2 Assessment & Plan:  1. Hb-SS disease without crisis Continue Hydrea 500 mg twice daily. We discussed the need for good hydration, monitoring of hydration status, avoidance of heat, cold, stress, and infection triggers. We discussed the risks and benefits of Hydrea, including bone marrow suppression, the possibility of GI upset, skin ulcers, hair thinning, and teratogenicity. The patient was reminded of the need to seek medical attention of any symptoms of bleeding, anemia, or infection. Continue folic acid 1 mg daily to prevent aplastic bone marrow crises.  -He does not have a hematologist. We will need to send a referral.   Eye - High risk of proliferative retinopathy. Annual eye exam with retinal exam recommended to patient.  Sent referral at last visit Mr. Nicolas Rogers is up to date with vaccinations.  - CBC with Differential - COMPLETE  METABOLIC PANEL WITH GFR -Urinalysis; complete 2. Vitamin D deficiency He did not pick up previous Rx for vitamin D deficiency. Discussed at length.   - ergocalciferol (DRISDOL) 50000 UNITS capsule; Take 1 capsule (50,000 Units total) by mouth once a week.  Dispense: 4 capsule; Refill: 3  RTC: 3 months Hollis,Lachina M, FNP

## 2014-07-30 ENCOUNTER — Telehealth: Payer: Self-pay | Admitting: Family Medicine

## 2014-07-30 DIAGNOSIS — E875 Hyperkalemia: Secondary | ICD-10-CM

## 2014-07-30 DIAGNOSIS — R319 Hematuria, unspecified: Secondary | ICD-10-CM

## 2014-07-30 LAB — URINALYSIS, COMPLETE
BILIRUBIN URINE: NEGATIVE
Bacteria, UA: NONE SEEN
CASTS: NONE SEEN
CRYSTALS: NONE SEEN
GLUCOSE, UA: NEGATIVE mg/dL
Ketones, ur: NEGATIVE mg/dL
Leukocytes, UA: NEGATIVE
Nitrite: NEGATIVE
Protein, ur: NEGATIVE mg/dL
SPECIFIC GRAVITY, URINE: 1.012 (ref 1.005–1.030)
SQUAMOUS EPITHELIAL / LPF: NONE SEEN
Urobilinogen, UA: 2 mg/dL — ABNORMAL HIGH (ref 0.0–1.0)
pH: 6 (ref 5.0–8.0)

## 2014-07-30 LAB — COMPLETE METABOLIC PANEL WITH GFR
ALT: 22 U/L (ref 0–53)
AST: 58 U/L — ABNORMAL HIGH (ref 0–37)
Albumin: 4.6 g/dL (ref 3.5–5.2)
Alkaline Phosphatase: 71 U/L (ref 39–117)
BILIRUBIN TOTAL: 3.8 mg/dL — AB (ref 0.2–1.2)
BUN: 11 mg/dL (ref 6–23)
CALCIUM: 9.1 mg/dL (ref 8.4–10.5)
CHLORIDE: 100 meq/L (ref 96–112)
CO2: 23 meq/L (ref 19–32)
CREATININE: 0.7 mg/dL (ref 0.50–1.35)
GFR, Est African American: >89 mL/min
GFR, Est Non African American: >89 mL/min
Glucose, Bld: 52 mg/dL — ABNORMAL LOW (ref 70–99)
Potassium: 6.2 mEq/L — ABNORMAL HIGH (ref 3.5–5.3)
Sodium: 134 mEq/L — ABNORMAL LOW (ref 135–145)
Total Protein: 8.1 g/dL (ref 6.0–8.3)

## 2014-07-30 LAB — CBC WITH DIFFERENTIAL/PLATELET
BASOS ABS: 0.2 10*3/uL — AB (ref 0.0–0.1)
BASOS PCT: 1 % (ref 0–1)
EOS ABS: 0.6 10*3/uL (ref 0.0–0.7)
Eosinophils Relative: 4 % (ref 0–5)
HEMATOCRIT: 21.9 % — AB (ref 39.0–52.0)
HEMOGLOBIN: 7.3 g/dL — AB (ref 13.0–17.0)
LYMPHS ABS: 5.8 10*3/uL — AB (ref 0.7–4.0)
Lymphocytes Relative: 38 % (ref 12–46)
MCH: 24.4 pg — ABNORMAL LOW (ref 26.0–34.0)
MCHC: 33.3 g/dL (ref 30.0–36.0)
MCV: 73.2 fL — ABNORMAL LOW (ref 78.0–100.0)
MPV: 10 fL (ref 8.6–12.4)
Monocytes Absolute: 1.1 10*3/uL — ABNORMAL HIGH (ref 0.1–1.0)
Monocytes Relative: 7 % (ref 3–12)
NEUTROS PCT: 50 % (ref 43–77)
Neutro Abs: 7.6 10*3/uL (ref 1.7–7.7)
PLATELETS: 516 10*3/uL — AB (ref 150–400)
RBC: 2.99 MIL/uL — AB (ref 4.22–5.81)
RDW: 28 % — ABNORMAL HIGH (ref 11.5–15.5)
WBC: 15.2 10*3/uL — ABNORMAL HIGH (ref 4.0–10.5)

## 2014-07-30 NOTE — Telephone Encounter (Signed)
Reviewed labs, potassium level mildly elevated. Will re-check potassium in 2 weeks. Patient denies dizziness, shortness of breath, heart palpitations, chest pain, nausea, vomiting,or diarrhea.    Massie MaroonHollis,Latonya Nelon M, FNP

## 2014-08-13 ENCOUNTER — Other Ambulatory Visit: Payer: Medicaid Other

## 2014-08-13 DIAGNOSIS — R319 Hematuria, unspecified: Secondary | ICD-10-CM

## 2014-08-13 DIAGNOSIS — E875 Hyperkalemia: Secondary | ICD-10-CM

## 2014-08-13 LAB — BASIC METABOLIC PANEL
BUN: 7 mg/dL (ref 6–23)
CALCIUM: 8.7 mg/dL (ref 8.4–10.5)
CO2: 22 mEq/L (ref 19–32)
Chloride: 105 mEq/L (ref 96–112)
Creat: 0.62 mg/dL (ref 0.50–1.35)
Glucose, Bld: 89 mg/dL (ref 70–99)
Potassium: 5 mEq/L (ref 3.5–5.3)
SODIUM: 135 meq/L (ref 135–145)

## 2014-08-14 LAB — URINALYSIS, COMPLETE
BACTERIA UA: NONE SEEN
Bilirubin Urine: NEGATIVE
CASTS: NONE SEEN
Crystals: NONE SEEN
GLUCOSE, UA: NEGATIVE mg/dL
Hgb urine dipstick: NEGATIVE
Ketones, ur: NEGATIVE mg/dL
LEUKOCYTES UA: NEGATIVE
Nitrite: NEGATIVE
PH: 6.5 (ref 5.0–8.0)
Protein, ur: NEGATIVE mg/dL
Specific Gravity, Urine: 1.009 (ref 1.005–1.030)
Squamous Epithelial / LPF: NONE SEEN
Urobilinogen, UA: 0.2 mg/dL (ref 0.0–1.0)

## 2014-10-29 ENCOUNTER — Other Ambulatory Visit: Payer: Medicaid Other

## 2015-01-08 ENCOUNTER — Telehealth: Payer: Self-pay

## 2015-01-08 NOTE — Telephone Encounter (Signed)
Misha from [facility] called 01/08/2015 @8:55am saying she has been unable to get in touch with patient to schedule an appointment.   

## 2015-01-29 ENCOUNTER — Ambulatory Visit: Payer: Medicaid Other | Admitting: Family Medicine

## 2015-02-23 ENCOUNTER — Encounter: Payer: Self-pay | Admitting: Family Medicine

## 2015-02-23 ENCOUNTER — Ambulatory Visit (INDEPENDENT_AMBULATORY_CARE_PROVIDER_SITE_OTHER): Payer: BLUE CROSS/BLUE SHIELD | Admitting: Family Medicine

## 2015-02-23 VITALS — BP 113/47 | HR 64 | Temp 97.8°F | Resp 16 | Ht 71.0 in | Wt 145.0 lb

## 2015-02-23 DIAGNOSIS — D571 Sickle-cell disease without crisis: Secondary | ICD-10-CM

## 2015-02-23 DIAGNOSIS — E559 Vitamin D deficiency, unspecified: Secondary | ICD-10-CM | POA: Diagnosis not present

## 2015-02-23 DIAGNOSIS — Z23 Encounter for immunization: Secondary | ICD-10-CM

## 2015-02-23 LAB — POCT URINALYSIS DIP (DEVICE)
BILIRUBIN URINE: NEGATIVE
GLUCOSE, UA: NEGATIVE mg/dL
Ketones, ur: NEGATIVE mg/dL
LEUKOCYTES UA: NEGATIVE
Nitrite: NEGATIVE
Protein, ur: NEGATIVE mg/dL
SPECIFIC GRAVITY, URINE: 1.015 (ref 1.005–1.030)
Urobilinogen, UA: 1 mg/dL (ref 0.0–1.0)
pH: 6 (ref 5.0–8.0)

## 2015-02-23 LAB — COMPLETE METABOLIC PANEL WITH GFR
ALT: 15 U/L (ref 9–46)
AST: 43 U/L — ABNORMAL HIGH (ref 10–40)
Albumin: 4.2 g/dL (ref 3.6–5.1)
Alkaline Phosphatase: 54 U/L (ref 40–115)
BUN: 8 mg/dL (ref 7–25)
CO2: 21 mmol/L (ref 20–31)
Calcium: 9.1 mg/dL (ref 8.6–10.3)
Chloride: 107 mmol/L (ref 98–110)
Creat: 0.64 mg/dL (ref 0.60–1.35)
Glucose, Bld: 83 mg/dL (ref 65–99)
POTASSIUM: 4.2 mmol/L (ref 3.5–5.3)
SODIUM: 138 mmol/L (ref 135–146)
Total Bilirubin: 4.4 mg/dL — ABNORMAL HIGH (ref 0.2–1.2)
Total Protein: 8 g/dL (ref 6.1–8.1)

## 2015-02-23 LAB — LACTATE DEHYDROGENASE: LDH: 896 U/L — AB (ref 94–250)

## 2015-02-23 MED ORDER — HYDROXYUREA 500 MG PO CAPS: 1000.0000 mg | ORAL_CAPSULE | Freq: Every morning | ORAL | Status: DC

## 2015-02-23 MED ORDER — FOLIC ACID 1 MG PO TABS
1.0000 mg | ORAL_TABLET | Freq: Every morning | ORAL | Status: DC
Start: 2015-02-23 — End: 2015-12-08

## 2015-02-23 NOTE — Progress Notes (Signed)
Subjective:    Patient ID: Nicolas Rogers, male    DOB: 04-24-1996, 19 y.o.   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 He reports that he feels well and is without current complaint.  Patient reports that he primarily takes Ibuprofen for OTC. He last had Ibuprofen on 02/22/2015 for low back pain, with maximum relief. Patient is not on opiate medications for pain relief related to sickle cell anemia.  He is taking medications consistently.He denies fever, chest pain, shortness of breath, nausea, vomiting, or diarrhea.   Past Medical History  Diagnosis Date  . Sickle cell anemia   . Sickle cell anemia   . Cholecystolithiasis    Social History   Social History  . Marital Status: Single    Spouse Name: N/A  . Number of Children: N/A  . Years of Education: N/A   Occupational History  . Not on file.   Social History Main Topics  . Smoking status: Never Smoker   . Smokeless tobacco: Not on file  . Alcohol Use: No  . Drug Use: No  . Sexual Activity: Not on file   Other Topics Concern  . Not on file   Social History Narrative  No Known Allergies Immunization History  Administered Date(s) Administered  . Influenza,inj,Quad PF,36+ Mos 03/12/2014, 02/23/2015  . Pneumococcal Conjugate-13 02/23/2015  . Pneumococcal Polysaccharide-23 12/09/2013  . Tdap 09/08/2012   No Known Allergies Review of Systems  Constitutional: Negative.  Negative for fever and fatigue.  HENT: Negative.   Eyes: Negative.   Respiratory: Negative.   Cardiovascular: Negative.  Negative for palpitations and leg swelling.  Gastrointestinal: Negative.  Negative for nausea, diarrhea and constipation.  Endocrine: Negative.   Genitourinary: Negative.   Musculoskeletal: Positive for back pain (Periodically). Negative for myalgias and neck pain.  Skin: Negative.        Acne to face- open and closed comedones   Allergic/Immunologic: Negative.   Neurological: Negative.  Negative for dizziness, syncope and headaches.  Hematological: Negative.   Psychiatric/Behavioral: Negative.  Negative for suicidal ideas and sleep disturbance.       Objective:   Physical Exam  Constitutional: He is oriented to person, place, and time. He appears well-developed and well-nourished.  HENT:  Head: Normocephalic and atraumatic.  Right Ear: External ear normal.  Left Ear: External ear normal.  Mouth/Throat: Oropharynx is clear and moist.  Eyes: Conjunctivae are normal. Pupils are equal, round, and reactive to light. Right eye exhibits no discharge. Left eye exhibits no discharge.  Neck: Normal range of motion. Neck supple.  Pulmonary/Chest: Effort normal and breath sounds normal.  Abdominal: Soft. Bowel sounds are normal.  Musculoskeletal: Normal range of motion. He exhibits no edema or tenderness.  Neurological: He is alert and oriented to person, place, and time.  Skin: Skin is warm and dry.  Psychiatric: He has a normal mood and affect. His behavior is normal. Judgment and thought content normal.       BP 113/47 mmHg  Pulse 64  Temp(Src) 97.8 F (36.6 C) (Oral)  Resp 16  Ht 5\' 11"  (1.803 m)  Wt 145 lb (65.772 kg)  BMI 20.23 kg/m2 Assessment & Plan:  1. Hb-SS disease without crisis Continue Hydrea 500 mg twice daily. Will check ANC, hemolobin, platelet and reticulocyte count. We discussed the need for good hydration, monitoring of hydration status, avoidance of heat, cold, stress, and infection triggers.  We discussed the risks and benefits of Hydrea, including bone marrow suppression, the possibility of GI upset, skin ulcers, hair thinning, and teratogenicity. The patient was reminded of the need to seek medical attention of any symptoms of bleeding, anemia, or infection. Continue folic acid 1 mg daily to prevent aplastic bone marrow crises.  -He does not have a hematologist. Sent a referral on previous  visit. Patient states that he did not schedule an appointment  Eye - High risk of proliferative retinopathy. Annual eye exam with retinal exam recommended to patient.  Sent referral at last visit. Will schedule an appointment with Dr. Elmer Picker  - Lactate Dehydrogenase - COMPLETE METABOLIC PANEL WITH GFR - Reticulocytes - CBC with Differential - hydroxyurea (HYDREA) 500 MG capsule; Take 2 capsules (1,000 mg total) by mouth every morning. May take with food to minimize GI side effects.  Dispense: 60 capsule; Refill: 5 - folic acid (FOLVITE) 1 MG tablet; Take 1 tablet (1 mg total) by mouth every morning.  Dispense: 30 tablet; Refill: 11  2. Vitamin D deficiency  - Vitamin D, 25-hydroxy  3. Need for prophylactic vaccination and inoculation against influenza  - Flu Vaccine QUAD 36+ mos PF IM (Fluarix & Fluzone Quad PF)  4. Immunization due  - Pneumococcal conjugate vaccine 13-valent RTC: 3 months [place]bron Pulse M, FNP

## 2015-02-24 ENCOUNTER — Telehealth: Payer: Self-pay | Admitting: Family Medicine

## 2015-02-24 DIAGNOSIS — E559 Vitamin D deficiency, unspecified: Secondary | ICD-10-CM

## 2015-02-24 LAB — CBC WITH DIFFERENTIAL/PLATELET
BASOS PCT: 1 % (ref 0–1)
Basophils Absolute: 0.1 10*3/uL (ref 0.0–0.1)
EOS ABS: 0.9 10*3/uL — AB (ref 0.0–0.7)
Eosinophils Relative: 8 % — ABNORMAL HIGH (ref 0–5)
HCT: 21.8 % — ABNORMAL LOW (ref 39.0–52.0)
Hemoglobin: 7.6 g/dL — ABNORMAL LOW (ref 13.0–17.0)
Lymphocytes Relative: 34 % (ref 12–46)
Lymphs Abs: 3.7 10*3/uL (ref 0.7–4.0)
MCH: 25.9 pg — ABNORMAL LOW (ref 26.0–34.0)
MCHC: 34.9 g/dL (ref 30.0–36.0)
MCV: 74.1 fL — ABNORMAL LOW (ref 78.0–100.0)
MPV: 10 fL (ref 8.6–12.4)
Monocytes Absolute: 0.8 10*3/uL (ref 0.1–1.0)
Monocytes Relative: 7 % (ref 3–12)
NEUTROS PCT: 50 % (ref 43–77)
Neutro Abs: 5.4 10*3/uL (ref 1.7–7.7)
PLATELETS: 549 10*3/uL — AB (ref 150–400)
RBC: 2.94 MIL/uL — ABNORMAL LOW (ref 4.22–5.81)
RDW: 27 % — ABNORMAL HIGH (ref 11.5–15.5)
WBC: 10.8 10*3/uL — AB (ref 4.0–10.5)

## 2015-02-24 LAB — VITAMIN D 25 HYDROXY (VIT D DEFICIENCY, FRACTURES): VIT D 25 HYDROXY: 23 ng/mL — AB (ref 30–100)

## 2015-02-24 LAB — RETICULOCYTES
ABS RETIC: 255.8 10*3/uL — AB (ref 19.0–186.0)
RBC.: 2.94 MIL/uL — ABNORMAL LOW (ref 4.22–5.81)
Retic Ct Pct: 8.7 % — ABNORMAL HIGH (ref 0.4–2.3)

## 2015-02-24 MED ORDER — ERGOCALCIFEROL 1.25 MG (50000 UT) PO CAPS: 50000.0000 [IU] | ORAL_CAPSULE | ORAL | Status: DC

## 2015-02-24 NOTE — Telephone Encounter (Signed)
Called, phone is not accepting calls at this time. No way to leave a message. Will Call later. Thanks!

## 2015-02-24 NOTE — Telephone Encounter (Signed)
Meds ordered this encounter  Medications  . ergocalciferol (DRISDOL) 50000 UNITS capsule    Sig: Take 1 capsule (50,000 Units total) by mouth once a week.    Dispense:  4 capsule    Refill:  3

## 2015-02-25 NOTE — Telephone Encounter (Signed)
Tried to call, phone not accepting calls at this time. Will try Later. Thanks!

## 2015-02-27 NOTE — Telephone Encounter (Signed)
Tried to call. Patient;s phone not accepting calls.

## 2015-05-26 ENCOUNTER — Ambulatory Visit: Payer: BLUE CROSS/BLUE SHIELD | Admitting: Family Medicine

## 2015-12-01 ENCOUNTER — Ambulatory Visit: Payer: BLUE CROSS/BLUE SHIELD | Admitting: Family Medicine

## 2015-12-08 ENCOUNTER — Ambulatory Visit (INDEPENDENT_AMBULATORY_CARE_PROVIDER_SITE_OTHER): Payer: BLUE CROSS/BLUE SHIELD | Admitting: Family Medicine

## 2015-12-08 VITALS — BP 106/64 | HR 72 | Temp 98.3°F | Resp 14 | Ht 71.0 in | Wt 153.0 lb

## 2015-12-08 DIAGNOSIS — D571 Sickle-cell disease without crisis: Secondary | ICD-10-CM

## 2015-12-08 DIAGNOSIS — E559 Vitamin D deficiency, unspecified: Secondary | ICD-10-CM | POA: Diagnosis not present

## 2015-12-08 LAB — COMPLETE METABOLIC PANEL WITH GFR
ALBUMIN: 4.4 g/dL (ref 3.6–5.1)
ALK PHOS: 64 U/L (ref 40–115)
ALT: 10 U/L (ref 9–46)
AST: 39 U/L (ref 10–40)
BILIRUBIN TOTAL: 5.8 mg/dL — AB (ref 0.2–1.2)
BUN: 6 mg/dL — AB (ref 7–25)
CO2: 25 mmol/L (ref 20–31)
Calcium: 9.1 mg/dL (ref 8.6–10.3)
Chloride: 105 mmol/L (ref 98–110)
Creat: 0.7 mg/dL (ref 0.60–1.35)
GFR, Est African American: >89 mL/min (ref >=60)
Glucose, Bld: 80 mg/dL (ref 65–99)
POTASSIUM: 5 mmol/L (ref 3.5–5.3)
Sodium: 136 mmol/L (ref 135–146)
TOTAL PROTEIN: 7.8 g/dL (ref 6.1–8.1)

## 2015-12-08 MED ORDER — OXYCODONE-ACETAMINOPHEN 5-325 MG PO TABS: ORAL_TABLET | ORAL | 0 refills | Status: DC

## 2015-12-08 MED ORDER — HYDROXYUREA 500 MG PO CAPS: 1000.0000 mg | ORAL_CAPSULE | Freq: Every morning | ORAL | 5 refills | Status: DC

## 2015-12-08 MED ORDER — FOLIC ACID 1 MG PO TABS
1.0000 mg | ORAL_TABLET | Freq: Every morning | ORAL | 11 refills | Status: DC
Start: 2015-12-08 — End: 2016-04-29

## 2015-12-08 MED ORDER — ERGOCALCIFEROL 1.25 MG (50000 UT) PO CAPS: 50000.0000 [IU] | ORAL_CAPSULE | ORAL | 3 refills | Status: DC

## 2015-12-09 ENCOUNTER — Encounter: Payer: Self-pay | Admitting: Family Medicine

## 2015-12-09 LAB — HIV ANTIBODY (ROUTINE TESTING W REFLEX): HIV: NONREACTIVE

## 2015-12-09 NOTE — Patient Instructions (Signed)
Based on your history, I would expect your narcotic to last for a couple of months.

## 2015-12-09 NOTE — Progress Notes (Signed)
Nicolas Rogers, is a 20 y.o. male  NLG:[id number]  [id number]:[id number]  DOB - 05-03-1996  CC:  Chief Complaint  Patient presents with  . Follow-up  . Medication Refill    hydrea, oxycodone, folic acid        HPI: Nicolas Rogers is a 20 y.o. male here for follow-up sickle cell disease. She was seen last about 3 months ago. He reports rarely using. He has not had a refill in several months. He reports he occ uses one of his brothers Percocet when ibuprofen does not work. When he does have [place] pain it is in his lower back. He denies fever, chest pain, dyspnea, N/V/D or significant constipation. Also denies hip pain of sores of the lower legs.  He does have a history of a Vitamin D deficiency and needs a refill on his Drisdol as well as hydrea and folic acid. He reports never being screened for HIV.  No Known Allergies Past Medical History:  Diagnosis Date  . Cholecystolithiasis   . Sickle cell anemia (HCC)   . Sickle cell anemia (HCC)    Current Outpatient Prescriptions on File Prior to Visit  Medication Sig Dispense Refill  . ibuprofen (ADVIL,MOTRIN) 600 MG tablet Take 1 tablet (600 mg total) by mouth every 8 (eight) hours as needed. 30 tablet 2   No current facility-administered medications on file prior to visit.    No family history on file. Social History   Social History  . Marital status: Single    Spouse name: N/A  . Number of children: N/A  . Years of education: N/A   Occupational History  . Not on file.   Social History Main Topics  . Smoking status: Never Smoker  . Smokeless tobacco: Not on file  . Alcohol use No  . Drug use: No  . Sexual activity: Not on file   Other Topics Concern  . Not on file   Social History Narrative  . No narrative on file    Review of Systems: Constitutional: Negative for fever, chills, appetite change, weight loss,  Fatigue. Skin: Negative for rashes or lesions of concern. HENT: Negative for ear pain, ear discharge.nose  bleeds Eyes: Negative for pain, discharge, redness, itching and visual disturbance. Neck: Negative for pain, stiffness Respiratory: Negative for cough, shortness of breath,   Cardiovascular: Negative for chest pain, palpitations and leg swelling. Gastrointestinal: Negative for abdominal pain, nausea, vomiting, diarrhea, constipations Genitourinary: Negative for dysuria, urgency, frequency, hematuria,  Musculoskeletal: Negative for back pain, joint pain, joint  swelling, and gait problem.Negative for weakness. Neurological: Negative for dizziness, tremors, seizures, syncope,   light-headedness, numbness and headaches.  Hematological: Negative for easy bruising or bleeding Psychiatric/Behavioral: Negative for depression, anxiety, decreased concentration, confusion   Objective:   Vitals:   12/08/15 1422  BP: 106/64  Pulse: 72  Resp: 14  Temp: 98.3 F (36.8 C)    Physical Exam: Constitutional: Patient appears well-developed and well-nourished. No distress. HENT: Normocephalic, atraumatic, External right and left ear normal. Oropharynx is clear and moist.  Eyes: Conjunctivae and EOM are normal. PERRLA, no scleral icterus. Neck: Normal ROM. Neck supple. No lymphadenopathy, No thyromegaly. CVS: RRR, S1/S2 +, no murmurs, no gallops, no rubs Pulmonary: Effort and breath sounds normal, no stridor, rhonchi, wheezes, rales.  Abdominal: Soft. Normoactive BS,, no distension, tenderness, rebound or guarding.  Musculoskeletal: Normal range of motion. No edema and no tenderness.  Neuro: Alert.Normal muscle tone coordination. Non-focal Skin: Skin is warm and dry. No rash noted.  Not diaphoretic. No erythema. No pallor. Psychiatric: Normal mood and affect. Behavior, judgment, thought content normal.  Lab Results  Component Value Date   WBC 10.8 (H) 02/23/2015   HGB 7.6 (L) 02/23/2015   HCT 21.8 (L) 02/23/2015   MCV 74.1 (L) 02/23/2015   PLT 549 (H) 02/23/2015   Lab Results  Component Value  Date   CREATININE 0.64 02/23/2015   BUN 8 02/23/2015   NA 138 02/23/2015   K 4.2 02/23/2015   CL 107 02/23/2015   CO2 21 02/23/2015    No results found for: HGBA1C Lipid Panel  No results found for: CHOL, TRIG, HDL, CHOLHDL, VLDL, LDLCALC     Assessment and plan:   1. Sickle cell anemia without crisis (HCC)  - COMPLETE METABOLIC PANEL WITH GFR - Vitamin D 1,25 dihydroxy - HIV antibody (with reflex) -Percocet 5/325 #20, one po q 6 hours prn pain not relieved by ibuprofen - folic acid (FOLVITE) 1 MG tablet; Take 1 tablet (1 mg total) by mouth every morning.  Dispense: 30 tablet; Refill: 11 - hydroxyurea (HYDREA) 500 MG capsule; Take 2 capsules (1,000 mg total) by mouth every morning. May take with food to minimize GI side effects.  Dispense: 60 capsule; Refill: 5  3. Vitamin D deficiency  - ergocalciferol (DRISDOL) 50000 units capsule; Take 1 capsule (50,000 Units total) by mouth once a week.  Dispense: 4 capsule; Refill: 3   Return in about 3 months (around 03/09/2016).  The patient was given clear instructions to go to ER or return to medical center if symptoms don't improve, worsen or new problems develop. The patient verbalized understanding.    Henrietta Hoover FNP  12/09/2015, 1:50 PM

## 2015-12-12 LAB — VITAMIN D 1,25 DIHYDROXY
VITAMIN D 1, 25 (OH) TOTAL: 68 pg/mL (ref 18–72)
Vitamin D2 1, 25 (OH)2: <8 pg/mL
Vitamin D3 1, 25 (OH)2: 68 pg/mL

## 2016-03-10 ENCOUNTER — Ambulatory Visit: Payer: BLUE CROSS/BLUE SHIELD | Admitting: Family Medicine

## 2016-04-29 ENCOUNTER — Encounter: Payer: Self-pay | Admitting: Family Medicine

## 2016-04-29 ENCOUNTER — Ambulatory Visit (INDEPENDENT_AMBULATORY_CARE_PROVIDER_SITE_OTHER): Payer: Medicare Other | Admitting: Family Medicine

## 2016-04-29 VITALS — BP 98/54 | HR 71 | Temp 98.7°F | Resp 16 | Ht 71.0 in | Wt 156.0 lb

## 2016-04-29 DIAGNOSIS — E559 Vitamin D deficiency, unspecified: Secondary | ICD-10-CM

## 2016-04-29 DIAGNOSIS — D571 Sickle-cell disease without crisis: Secondary | ICD-10-CM

## 2016-04-29 LAB — CBC WITH DIFFERENTIAL/PLATELET
Basophils Absolute: 0 cells/uL (ref 0–200)
Basophils Relative: 0 %
Eosinophils Absolute: 973 cells/uL — ABNORMAL HIGH (ref 15–500)
Eosinophils Relative: 7 %
HCT: 21.8 % — ABNORMAL LOW (ref 38.5–50.0)
Hemoglobin: 7.1 g/dL — ABNORMAL LOW (ref 13.2–17.1)
Lymphocytes Relative: 48 %
Lymphs Abs: 6672 cells/uL — ABNORMAL HIGH (ref 850–3900)
MCH: 25.3 pg — ABNORMAL LOW (ref 27.0–33.0)
MCHC: 32.6 g/dL (ref 32.0–36.0)
MCV: 77.6 fL — ABNORMAL LOW (ref 80.0–100.0)
MPV: 10.1 fL (ref 7.5–12.5)
Monocytes Absolute: 1112 cells/uL — ABNORMAL HIGH (ref 200–950)
Monocytes Relative: 8 %
Neutro Abs: 5143 cells/uL (ref 1500–7800)
Neutrophils Relative %: 37 %
Platelets: 446 10*3/uL — ABNORMAL HIGH (ref 140–400)
RBC: 2.81 MIL/uL — ABNORMAL LOW (ref 4.20–5.80)
RDW: 27.1 % — ABNORMAL HIGH (ref 11.0–15.0)
WBC: 13.9 10*3/uL — ABNORMAL HIGH (ref 3.8–10.8)

## 2016-04-29 LAB — COMPLETE METABOLIC PANEL WITH GFR
ALBUMIN: 3.8 g/dL (ref 3.6–5.1)
ALK PHOS: 51 U/L (ref 40–115)
ALT: 12 U/L (ref 9–46)
AST: 38 U/L (ref 10–40)
BILIRUBIN TOTAL: 3.7 mg/dL — AB (ref 0.2–1.2)
BUN: 7 mg/dL (ref 7–25)
CO2: 23 mmol/L (ref 20–31)
Calcium: 8.8 mg/dL (ref 8.6–10.3)
Chloride: 107 mmol/L (ref 98–110)
Creat: 0.72 mg/dL (ref 0.60–1.35)
Glucose, Bld: 89 mg/dL (ref 65–99)
Potassium: 4.2 mmol/L (ref 3.5–5.3)
Sodium: 137 mmol/L (ref 135–146)
TOTAL PROTEIN: 7.2 g/dL (ref 6.1–8.1)

## 2016-04-29 LAB — POCT URINALYSIS DIP (DEVICE)
Bilirubin Urine: NEGATIVE
GLUCOSE, UA: NEGATIVE mg/dL
Hgb urine dipstick: NEGATIVE
KETONES UR: NEGATIVE mg/dL
LEUKOCYTES UA: NEGATIVE
Nitrite: NEGATIVE
PROTEIN: NEGATIVE mg/dL
Specific Gravity, Urine: 1.015 (ref 1.005–1.030)
Urobilinogen, UA: 2 mg/dL — ABNORMAL HIGH (ref 0.0–1.0)
pH: 7 (ref 5.0–8.0)

## 2016-04-29 MED ORDER — FOLIC ACID 1 MG PO TABS: 1.0000 mg | ORAL_TABLET | Freq: Every morning | ORAL | 11 refills | Status: DC

## 2016-04-29 MED ORDER — ERGOCALCIFEROL 1.25 MG (50000 UT) PO CAPS: 50000.0000 [IU] | ORAL_CAPSULE | ORAL | 3 refills | Status: DC

## 2016-04-29 MED ORDER — HYDROXYUREA 500 MG PO CAPS: 1000.0000 mg | ORAL_CAPSULE | Freq: Every morning | ORAL | 5 refills | Status: DC

## 2016-04-29 MED ORDER — IBUPROFEN 600 MG PO TABS: 600.0000 mg | ORAL_TABLET | Freq: Three times a day (TID) | ORAL | 2 refills | Status: DC | PRN

## 2016-04-29 NOTE — Progress Notes (Signed)
Subjective:    Patient ID: Nicolas Rogers, male    DOB: 06-28-1995, 20 y.o.   MRN: [id number]  HPI Nicolas Rogers is a 20 year old male with HbSS that presents for a 6 month follow-up of sickle cell anemia. He reports that he feels well and is without current complaint.  Patient reports that he primarily takes Ibuprofen for OTC. Marland Kitchen. Patient takes Roxicet 5-325 mg periodically for pain relief related to sickle cell anemia.  He is not taking medications consistently. He has been out of Hydroxyurea for greater than 1 month. He denies fever, chest pain, shortness of breath, nausea, vomiting, or diarrhea.   Past Medical History:  Diagnosis Date  . Cholecystolithiasis   . Sickle cell anemia (HCC)   . Sickle cell anemia (HCC)    Social History   Social History  . Marital status: Single    Spouse name: N/A  . Number of children: N/A  . Years of education: N/A   Occupational History  . Not on file.   Social History Main Topics  . Smoking status: Never Smoker  . Smokeless tobacco: Never Used  . Alcohol use No  . Drug use: No  . Sexual activity: Not on file   Other Topics Concern  . Not on file   Social History Narrative  . No narrative on file  No Known Allergies Immunization History  Administered Date(s) Administered  . Influenza,inj,Quad PF,36+ Mos 03/12/2014, 02/23/2015  . Pneumococcal Conjugate-13 02/23/2015  . Pneumococcal Polysaccharide-23 12/09/2013  . Tdap 09/08/2012   No Known Allergies Review of Systems  Constitutional: Negative.  Negative for fatigue and fever.  HENT: Negative.   Eyes: Negative.   Respiratory: Negative.   Cardiovascular: Negative.  Negative for palpitations and leg swelling.  Gastrointestinal: Negative.  Negative for constipation, diarrhea and nausea.  Endocrine: Negative.   Genitourinary: Negative.   Musculoskeletal: Positive for back pain (Periodically). Negative for myalgias and neck pain.  Skin: Negative.        Acne to face- open and closed  comedones  Allergic/Immunologic: Negative.   Neurological: Negative.  Negative for dizziness, syncope and headaches.  Hematological: Negative.   Psychiatric/Behavioral: Negative.  Negative for sleep disturbance and suicidal ideas.       Objective:   Physical Exam  Constitutional: He is oriented to person, place, and time. He appears well-developed and well-nourished.  HENT:  Head: Normocephalic and atraumatic.  Right Ear: External ear normal.  Left Ear: External ear normal.  Mouth/Throat: Oropharynx is clear and moist.  Eyes: Conjunctivae are normal. Pupils are equal, round, and reactive to light. Right eye exhibits no discharge. Left eye exhibits no discharge.  Neck: Normal range of motion. Neck supple.  Pulmonary/Chest: Effort normal and breath sounds normal.  Abdominal: Soft. Bowel sounds are normal.  Musculoskeletal: Normal range of motion. He exhibits no edema or tenderness.  Neurological: He is alert and oriented to person, place, and time.  Skin: Skin is warm and dry.  Psychiatric: He has a normal mood and affect. His behavior is normal. Judgment and thought content normal.       BP (!) 98/54 (BP Location: Right Arm, Patient Position: Sitting, Cuff Size: Normal)   Pulse 71   Temp 98.7 F (37.1 C) (Oral)   Resp 16   Ht 5\' 11"  (1.803 m)   Wt 156 lb (70.8 kg)   SpO2 93%   BMI 21.76 kg/m  Assessment & Plan:  1. Hb-SS disease without crisis Continue Hydrea 500 mg twice  daily. Will check ANC, hemolobin, platelet and reticulocyte count. We discussed the need for good hydration, monitoring of hydration status, avoidance of heat, cold, stress, and infection triggers. We discussed the risks and benefits of Hydrea, including bone marrow suppression, the possibility of GI upset, skin ulcers, hair thinning, and teratogenicity. The patient was reminded of the need to seek medical attention of any symptoms of bleeding, anemia, or infection. Continue folic acid 1 mg daily to prevent  aplastic bone marrow crises.  -He does not have a hematologist. Sent a referral on previous visit. Patient states that he did not schedule an appointment  Eye - High risk of proliferative retinopathy. Annual eye exam with retinal exam recommended to patient.  Sent referral at last visit. Will schedule an appointment [facility] - CBC with Differential - COMPLETE METABOLIC PANEL WITH GFR - folic acid (FOLVITE) 1 MG tablet; Take 1 tablet (1 mg total) by mouth every morning.  Dispense: 30 tablet; Refill: 11 - hydroxyurea (HYDREA) 500 MG capsule; Take 2 capsules (1,000 mg total) by mouth every morning. May take with food to minimize GI side effects.  Dispense: 60 capsule; Refill: 5 - ibuprofen (ADVIL,MOTRIN) 600 MG tablet; Take 1 tablet (600 mg total) by mouth every 8 (eight) hours as needed.  Dispense: 30 tablet; Refill: 2 - Ambulatory referral to Ophthalmology Reviewed [place] Substance Reporting system prior to reorder  2. Vitamin D deficiency - Vitamin D, 25-hydroxy - ergocalciferol (DRISDOL) 50000 units capsule; Take 1 capsule (50,000 Units total) by mouth once a week.  Dispense: 4 capsule; Refill: 3   RTC: 6 months for sickle cell anemia Hollis,Lachina M, FNP

## 2016-04-29 NOTE — Patient Instructions (Addendum)
Sickle Cell Anemia, Adult °Sickle cell anemia is a condition where your red blood cells are shaped like sickles. Red blood cells carry oxygen through the body. Sickle-shaped red blood cells do not live as long as normal red blood cells. They also clump together and block blood from flowing through the blood vessels. These things prevent the body from getting enough oxygen. Sickle cell anemia causes organ damage and pain. It also increases the risk of infection. °Follow these instructions at home: °· Drink enough fluid to keep your pee (urine) clear or pale yellow. Drink more in hot weather and during exercise. °· Do not smoke. Smoking lowers oxygen levels in the blood. °· Only take over-the-counter or prescription medicines as told by your doctor. °· Take antibiotic medicines as told by your doctor. Make sure you finish them even if you start to feel better. °· Take supplements as told by your doctor. °· Consider wearing a medical alert bracelet. This tells anyone caring for you in an emergency of your condition. °· When traveling, keep your medical information, doctors' names, and the medicines you take with you at all times. °· If you have a fever, do not take fever medicines right away. This could cover up a problem. Tell your doctor. °· Keep all follow-up visits with your doctor. Sickle cell anemia requires regular medical care. °Contact a doctor if: °You have a fever. °Get help right away if: °· You feel dizzy or faint. °· You have new belly (abdominal) pain, especially on the left side near the stomach area. °· You have a lasting, often uncomfortable and painful erection of the penis (priapism). If it is not treated right away, you will become unable to have sex (impotence). °· You have numbness in your arms or legs or you have a hard time moving them. °· You have a hard time talking. °· You have a fever or lasting symptoms for more than 2-3 days. °· You have a fever and your symptoms suddenly get  worse. °· You have signs or symptoms of infection. These include: °? Chills. °? Being more tired than normal (lethargy). °? Irritability. °? Poor eating. °? Throwing up (vomiting). °· You have pain that is not helped with medicine. °· You have shortness of breath. °· You have pain in your chest. °· You are coughing up pus-like or bloody mucus. °· You have a stiff neck. °· Your feet or hands swell or have pain. °· Your belly looks bloated. °· Your joints hurt. °This information is not intended to replace advice given to you by your health care provider. Make sure you discuss any questions you have with your health care provider. °Document Released: 02/13/2013 Document Revised: 10/01/2015 Document Reviewed: 12/05/2012 °Elsevier Interactive Patient Education © 2017 [employer] ° °

## 2016-04-30 LAB — VITAMIN D 25 HYDROXY (VIT D DEFICIENCY, FRACTURES): VIT D 25 HYDROXY: 19 ng/mL — AB (ref 30–100)

## 2016-05-04 ENCOUNTER — Other Ambulatory Visit: Payer: Self-pay | Admitting: Family Medicine

## 2016-05-04 DIAGNOSIS — E559 Vitamin D deficiency, unspecified: Secondary | ICD-10-CM

## 2016-05-04 MED ORDER — ERGOCALCIFEROL 1.25 MG (50000 UT) PO CAPS: 50000.0000 [IU] | ORAL_CAPSULE | ORAL | 3 refills | Status: DC

## 2016-05-04 NOTE — Progress Notes (Signed)
Outpatient Encounter Prescriptions as of 05/04/2016  Medication Sig  . ergocalciferol (DRISDOL) 50000 units capsule Take 1 capsule (50,000 Units total) by mouth once a week.  . folic acid (FOLVITE) 1 MG tablet Take 1 tablet (1 mg total) by mouth every morning.  . hydroxyurea (HYDREA) 500 MG capsule Take 2 capsules (1,000 mg total) by mouth every morning. May take with food to minimize GI side effects.  Marland Kitchen. ibuprofen (ADVIL,MOTRIN) 600 MG tablet Take 1 tablet (600 mg total) by mouth every 8 (eight) hours as needed.  Marland Kitchen. oxyCODONE-acetaminophen (ROXICET) 5-325 MG tablet Take one tablet every 6 hours as needed for severe pain (Patient not taking: Reported on 04/29/2016)  . [DISCONTINUED] ergocalciferol (DRISDOL) 50000 units capsule Take 1 capsule (50,000 Units total) by mouth once a week.   No facility-administered encounter medications on file as of 05/04/2016.

## 2016-05-23 ENCOUNTER — Encounter (HOSPITAL_BASED_OUTPATIENT_CLINIC_OR_DEPARTMENT_OTHER): Payer: Self-pay | Admitting: *Deleted

## 2016-05-23 ENCOUNTER — Emergency Department (HOSPITAL_BASED_OUTPATIENT_CLINIC_OR_DEPARTMENT_OTHER): Payer: Medicare Other

## 2016-05-23 ENCOUNTER — Emergency Department (HOSPITAL_BASED_OUTPATIENT_CLINIC_OR_DEPARTMENT_OTHER)
Admission: EM | Admit: 2016-05-23 | Discharge: 2016-05-23 | Disposition: A | Discharge status: Home / Self Care | Payer: Medicare Other | Attending: Emergency Medicine | Admitting: Emergency Medicine

## 2016-05-23 DIAGNOSIS — Y929 Unspecified place or not applicable: Secondary | ICD-10-CM | POA: Diagnosis not present

## 2016-05-23 DIAGNOSIS — Y999 Unspecified external cause status: Secondary | ICD-10-CM | POA: Diagnosis not present

## 2016-05-23 DIAGNOSIS — Y939 Activity, unspecified: Secondary | ICD-10-CM | POA: Diagnosis not present

## 2016-05-23 DIAGNOSIS — W230XXA Caught, crushed, jammed, or pinched between moving objects, initial encounter: Secondary | ICD-10-CM | POA: Diagnosis not present

## 2016-05-23 DIAGNOSIS — S62621A Displaced fracture of medial phalanx of left index finger, initial encounter for closed fracture: Secondary | ICD-10-CM | POA: Diagnosis not present

## 2016-05-23 DIAGNOSIS — S6292XA Unspecified fracture of left wrist and hand, initial encounter for closed fracture: Secondary | ICD-10-CM | POA: Diagnosis present

## 2016-05-23 MED ORDER — NAPROXEN 250 MG PO TABS
500.0000 mg | ORAL_TABLET | Freq: Once | ORAL | Status: AC
  Administered 2016-05-23: 500 mg via ORAL
  Filled 2016-05-23: qty 2

## 2016-05-23 NOTE — ED Notes (Signed)
Back from xray, steady gait.

## 2016-05-23 NOTE — ED Triage Notes (Addendum)
Here for L index finger pain and swelling, states, "jammed finger Friday morning against gym equipment", put ice on it and it normalized, then again tonight while at work "went to cath a spool of yarn re-injured finger". No meds PTA, re-injury occurred at ~ 0200. L index PIP swelling noted. CMS intact, no obvious deformity, bruising, redness noted, skin intact, cap refill <2sec. ROM decreased d/t pain and swelling. "other wise healthy, last sickle cell crisis was ~3 months ago".

## 2016-05-23 NOTE — ED Notes (Signed)
Elevated and iced

## 2016-05-23 NOTE — ED Notes (Signed)
Ambulatory to x ray

## 2016-05-23 NOTE — ED Provider Notes (Signed)
[facility] Provider Note: Lowella DellJ. Lane Caeley Dohrmann, MD, FACEP  CSN: [id number] MRN: [id number] ARRIVAL: 05/23/16 at 0208 ROOM: [facility]09/[facility]09   CHIEF COMPLAINT  Finger Injury   HISTORY OF PRESENT ILLNESS  Nicolas Rogers is a 21 y.o. male who "jammed" his left index finger while working out at a gym 3 mornings ago. He treated it with ice with improvement. At work this morning about 2 AM he reinjured it while working with a spool of ER. There is swelling decreased range of motion of the proximal interphalangeal joint with moderate pain, worse with palpation or movement. He rates his pain as a 5 out of 10 but declines pain medication. He denies other injury. There is no numbness or weakness distally.  [place] [facility]  [place] reveals the patient has received one prescription for Percocet in the past year.   Past Medical History:  Diagnosis Date  . Cholecystolithiasis   . Sickle cell anemia (HCC)     Past Surgical History:  Procedure Laterality Date  . ADENOIDECTOMY    . TONSILLECTOMY      History reviewed. No pertinent family history.  Social History  Substance Use Topics  . Smoking status: Never Smoker  . Smokeless tobacco: Never Used  . Alcohol use No    Prior to Admission medications   Medication Sig Start Date End Date Taking? Authorizing Provider  ergocalciferol (DRISDOL) 50000 units capsule Take 1 capsule (50,000 Units total) by mouth once a week. 05/04/16   Massie MaroonLachina M Hollis, FNP  folic acid (FOLVITE) 1 MG tablet Take 1 tablet (1 mg total) by mouth every morning. 04/29/16   Massie MaroonLachina M Hollis, FNP  hydroxyurea (HYDREA) 500 MG capsule Take 2 capsules (1,000 mg total) by mouth every morning. May take with food to minimize GI side effects. 04/29/16   Massie MaroonLachina M Hollis, FNP  ibuprofen (ADVIL,MOTRIN) 600 MG tablet Take 1 tablet (600 mg total) by mouth every 8 (eight) hours as needed. 04/29/16   Massie MaroonLachina M Hollis, FNP    oxyCODONE-acetaminophen (ROXICET) 5-325 MG tablet Take one tablet every 6 hours as needed for severe pain Patient not taking: Reported on 04/29/2016 12/08/15   Henrietta HooverLinda C Bernhardt, NP    Allergies Patient has no known allergies.   REVIEW OF SYSTEMS  Negative except as noted here or in the History of Present Illness.   PHYSICAL EXAMINATION  Initial Vital Signs Blood pressure 103/65, pulse 76, temperature 98.8 F (37.1 C), temperature source Oral, resp. rate 16, height 6' (1.829 m), weight 156 lb (70.8 kg), SpO2 91 %.  Examination General: Well-developed, well-nourished male in no acute distress; appearance consistent with age of record HENT: normocephalic; atraumatic Eyes: Normal appearance Neck: supple Heart: regular rate and rhythm Lungs: Normal respiratory effort and excursion Abdomen: soft; nondistended Extremities: Tenderness, swelling and decreased range of motion of left index finger PIP joint, finger distally neurovascularly intact with intact tendon function at the DIP joint Neurologic: Awake, alert and oriented; motor function intact in all extremities and symmetric; no facial droop Skin: Warm and dry Psychiatric: Normal mood and affect   RESULTS  Summary of this visit's results, reviewed by myself:   EKG Interpretation  Date/Time:    Ventricular Rate:    PR Interval:    QRS Duration:   QT Interval:    QTC Calculation:   R Axis:     Text Interpretation:        Laboratory Studies: No results found for this or any previous  visit (from the past 24 hour(s)). Imaging Studies: Dg Finger Index Left  Result Date: 05/23/2016 CLINICAL DATA:  Left index finger pain and swelling. Injury 2 days prior. Jammed finger against gym equipment. Re-injured finger 1 hour prior. EXAM: LEFT INDEX FINGER 2+V COMPARISON:  None. FINDINGS: Minimally displaced fracture of the index finger middle phalanx involving the articular surface of the proximal interphalangeal joint. There is a 3  mm osseous fragment about the ulnar aspect of the articular surface. The digit is held in mild flexion. There is associated soft tissue edema. No additional fracture. IMPRESSION: Minimally displaced intra-articular fracture of the middle phalanx at the proximal interphalangeal joint. Electronically Signed   By: Rubye Oaks M.D.   On: 05/23/2016 03:11    ED COURSE  Nursing notes and initial vitals signs, including pulse oximetry, reviewed.  Vitals:   05/23/16 0230 05/23/16 0231  BP: 103/65   Pulse: 76   Resp: 16   Temp: 98.8 F (37.1 C)   TempSrc: Oral   SpO2: 91%   Weight:  156 lb (70.8 kg)  Height:  6' (1.829 m)    PROCEDURES    ED DIAGNOSES     ICD-9-CM ICD-10-CM   1. Closed displaced fracture of middle phalanx of left index finger, initial encounter 816.01 Z61.096E        Paula Libra, MD 05/23/16 4540

## 2016-05-23 NOTE — ED Notes (Signed)
EDP into room 

## 2016-05-30 ENCOUNTER — Other Ambulatory Visit: Payer: BLUE CROSS/BLUE SHIELD

## 2016-05-31 DIAGNOSIS — S62621A Displaced fracture of medial phalanx of left index finger, initial encounter for closed fracture: Secondary | ICD-10-CM | POA: Diagnosis not present

## 2016-05-31 DIAGNOSIS — M79645 Pain in left finger(s): Secondary | ICD-10-CM | POA: Diagnosis not present

## 2016-06-03 ENCOUNTER — Other Ambulatory Visit: Payer: Self-pay | Admitting: Family Medicine

## 2016-06-03 ENCOUNTER — Other Ambulatory Visit (INDEPENDENT_AMBULATORY_CARE_PROVIDER_SITE_OTHER): Payer: Medicare Other

## 2016-06-03 DIAGNOSIS — D571 Sickle-cell disease without crisis: Secondary | ICD-10-CM | POA: Diagnosis not present

## 2016-06-03 LAB — RETICULOCYTES
ABS Retic: 243900 cells/uL — ABNORMAL HIGH (ref 25000–90000)
RBC.: 2.71 MIL/uL — ABNORMAL LOW (ref 4.20–5.80)
Retic Ct Pct: 9 %

## 2016-06-03 LAB — CBC WITH DIFFERENTIAL/PLATELET
Basophils Absolute: 0 cells/uL (ref 0–200)
Basophils Relative: 0 %
Eosinophils Absolute: 707 cells/uL — ABNORMAL HIGH (ref 15–500)
Eosinophils Relative: 7 %
HEMATOCRIT: 21.4 % — AB (ref 38.5–50.0)
Hemoglobin: 7.2 g/dL — ABNORMAL LOW (ref 13.2–17.1)
LYMPHS PCT: 49 %
Lymphs Abs: 4949 cells/uL — ABNORMAL HIGH (ref 850–3900)
MCH: 26.6 pg — ABNORMAL LOW (ref 27.0–33.0)
MCHC: 33.6 g/dL (ref 32.0–36.0)
MCV: 79 fL — AB (ref 80.0–100.0)
MONO ABS: 909 {cells}/uL (ref 200–950)
MPV: 9.9 fL (ref 7.5–12.5)
Monocytes Relative: 9 %
NEUTROS PCT: 35 %
Neutro Abs: 3535 cells/uL (ref 1500–7800)
Platelets: 433 10*3/uL — ABNORMAL HIGH (ref 140–400)
RBC: 2.71 MIL/uL — ABNORMAL LOW (ref 4.20–5.80)
RDW: 28.6 % — AB (ref 11.0–15.0)
WBC: 10.1 10*3/uL (ref 3.8–10.8)

## 2016-06-08 DIAGNOSIS — S62621D Displaced fracture of medial phalanx of left index finger, subsequent encounter for fracture with routine healing: Secondary | ICD-10-CM | POA: Diagnosis not present

## 2016-06-22 DIAGNOSIS — S62621D Displaced fracture of medial phalanx of left index finger, subsequent encounter for fracture with routine healing: Secondary | ICD-10-CM | POA: Diagnosis not present

## 2016-07-10 DIAGNOSIS — J811 Chronic pulmonary edema: Secondary | ICD-10-CM | POA: Diagnosis not present

## 2016-07-10 DIAGNOSIS — R4182 Altered mental status, unspecified: Secondary | ICD-10-CM | POA: Diagnosis not present

## 2016-07-19 ENCOUNTER — Ambulatory Visit: Payer: Self-pay | Admitting: Family Medicine

## 2016-08-14 DIAGNOSIS — M25522 Pain in left elbow: Secondary | ICD-10-CM | POA: Diagnosis not present

## 2016-08-14 DIAGNOSIS — S199XXA Unspecified injury of neck, initial encounter: Secondary | ICD-10-CM | POA: Diagnosis not present

## 2016-08-14 DIAGNOSIS — M542 Cervicalgia: Secondary | ICD-10-CM | POA: Diagnosis not present

## 2016-08-14 DIAGNOSIS — S161XXA Strain of muscle, fascia and tendon at neck level, initial encounter: Secondary | ICD-10-CM | POA: Diagnosis not present

## 2016-08-14 DIAGNOSIS — S5002XA Contusion of left elbow, initial encounter: Secondary | ICD-10-CM | POA: Diagnosis not present

## 2016-08-22 ENCOUNTER — Emergency Department (HOSPITAL_COMMUNITY)
Admission: EM | Admit: 2016-08-22 | Discharge: 2016-08-22 | Disposition: A | Discharge status: Home / Self Care | Payer: Medicare Other | Source: Home / Self Care | Attending: Emergency Medicine | Admitting: Emergency Medicine

## 2016-08-22 ENCOUNTER — Emergency Department (HOSPITAL_COMMUNITY): Payer: Medicare Other

## 2016-08-22 ENCOUNTER — Encounter (HOSPITAL_COMMUNITY): Payer: Self-pay | Admitting: Emergency Medicine

## 2016-08-22 DIAGNOSIS — Z79899 Other long term (current) drug therapy: Secondary | ICD-10-CM

## 2016-08-22 DIAGNOSIS — N4830 Priapism, unspecified: Secondary | ICD-10-CM | POA: Diagnosis not present

## 2016-08-22 DIAGNOSIS — R509 Fever, unspecified: Secondary | ICD-10-CM | POA: Diagnosis not present

## 2016-08-22 DIAGNOSIS — D57219 Sickle-cell/Hb-C disease with crisis, unspecified: Secondary | ICD-10-CM | POA: Diagnosis not present

## 2016-08-22 DIAGNOSIS — J189 Pneumonia, unspecified organism: Secondary | ICD-10-CM | POA: Diagnosis not present

## 2016-08-22 DIAGNOSIS — R079 Chest pain, unspecified: Secondary | ICD-10-CM | POA: Diagnosis not present

## 2016-08-22 DIAGNOSIS — D5700 Hb-SS disease with crisis, unspecified: Secondary | ICD-10-CM | POA: Insufficient documentation

## 2016-08-22 DIAGNOSIS — D72829 Elevated white blood cell count, unspecified: Secondary | ICD-10-CM | POA: Diagnosis not present

## 2016-08-22 DIAGNOSIS — R0902 Hypoxemia: Secondary | ICD-10-CM | POA: Diagnosis not present

## 2016-08-22 DIAGNOSIS — J9811 Atelectasis: Secondary | ICD-10-CM | POA: Diagnosis not present

## 2016-08-22 DIAGNOSIS — D638 Anemia in other chronic diseases classified elsewhere: Secondary | ICD-10-CM | POA: Diagnosis not present

## 2016-08-22 LAB — CBC WITH DIFFERENTIAL/PLATELET
BASOS PCT: 1 %
Basophils Absolute: 0.2 10*3/uL — ABNORMAL HIGH (ref 0.0–0.1)
EOS ABS: 0 10*3/uL (ref 0.0–0.7)
Eosinophils Relative: 0 %
HCT: 22 % — ABNORMAL LOW (ref 39.0–52.0)
HEMOGLOBIN: 7.5 g/dL — AB (ref 13.0–17.0)
Lymphocytes Relative: 21 %
Lymphs Abs: 3.9 10*3/uL (ref 0.7–4.0)
MCH: 27 pg (ref 26.0–34.0)
MCHC: 34.1 g/dL (ref 30.0–36.0)
MCV: 79.1 fL (ref 78.0–100.0)
Monocytes Absolute: 2.1 10*3/uL — ABNORMAL HIGH (ref 0.1–1.0)
Monocytes Relative: 11 %
NEUTROS ABS: 12.6 10*3/uL — AB (ref 1.7–7.7)
Neutrophils Relative %: 67 %
Platelets: 380 10*3/uL (ref 150–400)
RBC: 2.78 MIL/uL — ABNORMAL LOW (ref 4.22–5.81)
RDW: 29.8 % — ABNORMAL HIGH (ref 11.5–15.5)
WBC: 18.8 10*3/uL — ABNORMAL HIGH (ref 4.0–10.5)

## 2016-08-22 LAB — COMPREHENSIVE METABOLIC PANEL
ALBUMIN: 4 g/dL (ref 3.5–5.0)
ALK PHOS: 55 U/L (ref 38–126)
ALT: 15 U/L — AB (ref 17–63)
ANION GAP: 6 (ref 5–15)
AST: 45 U/L — AB (ref 15–41)
CALCIUM: 8.5 mg/dL — AB (ref 8.9–10.3)
CO2: 24 mmol/L (ref 22–32)
CREATININE: 0.74 mg/dL (ref 0.61–1.24)
Chloride: 109 mmol/L (ref 101–111)
GFR calc Af Amer: >60 mL/min (ref >60)
GFR calc non Af Amer: >60 mL/min (ref >60)
GLUCOSE: 123 mg/dL — AB (ref 65–99)
Potassium: 4.1 mmol/L (ref 3.5–5.1)
SODIUM: 139 mmol/L (ref 135–145)
Total Bilirubin: 3.5 mg/dL — ABNORMAL HIGH (ref 0.3–1.2)
Total Protein: 8 g/dL (ref 6.5–8.1)

## 2016-08-22 LAB — RETICULOCYTES
RBC.: 2.78 MIL/uL — ABNORMAL LOW (ref 4.22–5.81)
RETIC CT PCT: 17.3 % — AB (ref 0.4–3.1)
Retic Count, Absolute: 480.9 10*3/uL — ABNORMAL HIGH (ref 19.0–186.0)

## 2016-08-22 MED ORDER — HYDROMORPHONE HCL 2 MG/ML IJ SOLN
1.0000 mg | INTRAMUSCULAR | Status: AC
  Administered 2016-08-22: 1 mg via INTRAVENOUS

## 2016-08-22 MED ORDER — HYDROMORPHONE HCL 2 MG/ML IJ SOLN
1.0000 mg | INTRAMUSCULAR | Status: AC
  Filled 2016-08-22: qty 1

## 2016-08-22 MED ORDER — ONDANSETRON HCL 4 MG/2ML IJ SOLN: 4.0000 mg | INTRAMUSCULAR | Status: DC | PRN

## 2016-08-22 MED ORDER — HYDROMORPHONE HCL 2 MG/ML IJ SOLN
1.0000 mg | INTRAMUSCULAR | Status: AC
  Administered 2016-08-22: 1 mg via INTRAVENOUS
  Filled 2016-08-22: qty 1

## 2016-08-22 MED ORDER — KETOROLAC TROMETHAMINE 15 MG/ML IJ SOLN
15.0000 mg | INTRAMUSCULAR | Status: AC
  Administered 2016-08-22: 15 mg via INTRAVENOUS
  Filled 2016-08-22: qty 1

## 2016-08-22 MED ORDER — OXYCODONE-ACETAMINOPHEN 5-325 MG PO TABS: 1.0000 | ORAL_TABLET | ORAL | 0 refills | Status: DC | PRN

## 2016-08-22 MED ORDER — DIPHENHYDRAMINE HCL 50 MG/ML IJ SOLN
25.0000 mg | Freq: Once | INTRAMUSCULAR | Status: AC
Start: 2016-08-22 — End: 2016-08-22
  Administered 2016-08-22: 25 mg via INTRAVENOUS
  Filled 2016-08-22: qty 1

## 2016-08-22 NOTE — ED Notes (Addendum)
Patient ambulatory without difficulty. Reports "I still feel a little dizzy but I know that is a side effect of the medicine." MD made aware.

## 2016-08-22 NOTE — ED Notes (Signed)
Patient reports feeling lightheaded. MD made aware. Per MD, encourage PO fluids. Patient given sprite and water.

## 2016-08-22 NOTE — ED Provider Notes (Signed)
[facility]Y [facility]PT Provider Note   CSN: [id number] Arrival date & time: 08/22/16  1607     History   Chief Complaint Chief Complaint  Patient presents with  . Sickle Cell Pain Crisis    HPI Nicolas Rogers is a 21 y.o. male.  HPI Pt has history of sickle cell disease.  He noticed mild pain in his lower legs this am. It was not too severe and he took his medications.  Throughout the day the pain increased.  It is also now his upper back. It hurts to breath.  No fevers.  No cough.  No vomiting or diarrhea.   No history of chest syndrome or PE. Past Medical History:  Diagnosis Date  . Cholecystolithiasis   . Sickle cell anemia [facility])     Patient Active Problem List   Diagnosis Date Noted  . Vitamin D deficiency 03/12/2014  . Need for immunization against influenza 03/12/2014  . 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 ADENOIDECTOMY    . TONSILLECTOMY         Home Medications    Prior to Admission medications   Medication Sig Start Date End Date Taking? Authorizing Provider  ergocalciferol (DRISDOL) 50000 units capsule Take 1 capsule (50,000 Units total) by mouth once a week. 05/04/16  Yes Massie Maroon, FNP  folic acid (FOLVITE) 1 MG tablet Take 1 tablet (1 mg total) by mouth every morning. 04/29/16  Yes Massie Maroon, FNP  hydroxyurea (HYDREA) 500 MG capsule Take 2 capsules (1,000 mg total) by mouth every morning. May take with food to minimize GI side effects. 04/29/16   Massie Maroon, FNP  ibuprofen (ADVIL,MOTRIN) 600 MG tablet Take 1 tablet (600 mg total) by mouth every 8 (eight) hours as needed. Patient taking differently: Take 600 mg by mouth every 8 (eight) hours as needed for moderate pain.  04/29/16   Massie Maroon, FNP  oxyCODONE-acetaminophen (PERCOCET/ROXICET) 5-325 MG tablet Take 1 tablet by mouth every 4 (four) hours as needed for severe pain. 08/22/16   Linwood Dibbles, MD    Family  History No family history on file.  Social History Social History  Substance Use Topics  . Smoking status: Never Smoker  . Smokeless tobacco: Never Used  . Alcohol use No     Allergies   Patient has no known allergies.   Review of Systems Review of Systems  All other systems reviewed and are negative.    Physical Exam Updated Vital Signs BP 116/74   Pulse 85   Temp 99.4 F (37.4 C) (Oral)   Resp 12   Ht 6' (1.829 m)   Wt 70.8 kg   SpO2 91%   BMI 21.16 kg/m   Physical Exam  Constitutional: He appears distressed.  HENT:  Head: Normocephalic and atraumatic.  Right Ear: External ear normal.  Left Ear: External ear normal.  Eyes: Conjunctivae are normal. Right eye exhibits no discharge. Left eye exhibits no discharge. No scleral icterus.  Neck: Neck supple. No tracheal deviation present.  Cardiovascular: Normal rate, regular rhythm and intact distal pulses.   Pulmonary/Chest: Effort normal and breath sounds normal. No stridor. No respiratory distress. He has no wheezes. He has no rales.  Abdominal: Soft. Bowel sounds are normal. He exhibits no distension. There is no tenderness. There is no rebound and no guarding.  Musculoskeletal: He exhibits tenderness. He exhibits no edema.  Tenderness diffusely in his extremities  Neurological:  He is alert. He has normal strength. No cranial nerve deficit (no facial droop, extraocular movements intact, no slurred speech) or sensory deficit. He exhibits normal muscle tone. He displays no seizure activity. Coordination normal.  Skin: Skin is warm and dry. No rash noted.  Psychiatric: He has a normal mood and affect.  Nursing note and vitals reviewed.    ED Treatments / Results  Labs (all labs ordered are listed, but only abnormal results are displayed) Labs Reviewed  COMPREHENSIVE METABOLIC PANEL - Abnormal; Notable for the following:       Result Value   Glucose, Bld 123 (*)    BUN <5 (*)    Calcium 8.5 (*)    AST 45 (*)     ALT 15 (*)    Total Bilirubin 3.5 (*)    All other components within normal limits  CBC WITH DIFFERENTIAL/PLATELET - Abnormal; Notable for the following:    WBC 18.8 (*)    RBC 2.78 (*)    Hemoglobin 7.5 (*)    HCT 22.0 (*)    RDW 29.8 (*)    Neutro Abs 12.6 (*)    Monocytes Absolute 2.1 (*)    Basophils Absolute 0.2 (*)    All other components within normal limits  RETICULOCYTES - Abnormal; Notable for the following:    Retic Ct Pct 17.3 (*)    RBC. 2.78 (*)    Retic Count, Manual 480.9 (*)    All other components within normal limits    EKG  EKG Interpretation  Date/Time:  Monday August 22 2016 16:24:30 EDT Ventricular Rate:  85 PR Interval:    QRS Duration: 75 QT Interval:  364 QTC Calculation: 433 R Axis:   69 Text Interpretation:  Sinus rhythm Borderline T abnormalities, diffuse leads Baseline wander in lead(s) V3 No old tracing to compare Confirmed by Aveyah Greenwood  MD-J, Terryn Rosenkranz [phone]) on 08/22/2016 7:41:12 PM       Radiology Dg Chest 2 View  Result Date: 08/22/2016 CLINICAL DATA:  Sickle cell crisis beginning today, chest pain. EXAM: CHEST  2 VIEW COMPARISON:  Chest radiograph November 19, 2013 FINDINGS: Cardiac silhouette is top-normal in size, mediastinal silhouette is unremarkable. Mild bronchitic changes. No pleural effusions or focal consolidations. Trachea projects midline and there is no pneumothorax. Soft tissue planes and included osseous structures are non-suspicious. Surgical clips in the included right abdomen compatible with cholecystectomy. IMPRESSION: Borderline cardiomegaly. Mild bronchitic changes without focal consolidation. Electronically Signed   By: Awilda Metro M.D.   On: 08/22/2016 17:14    Procedures Procedures (including critical care time)  Medications Ordered in ED Medications  ondansetron (ZOFRAN) injection 4 mg (not administered)  HYDROmorphone (DILAUDID) injection 1 mg (not administered)  ketorolac (TORADOL) 15 MG/ML injection 15 mg (15 mg  Intravenous Given 08/22/16 1824)  diphenhydrAMINE (BENADRYL) injection 25 mg (25 mg Intravenous Given 08/22/16 1820)  HYDROmorphone (DILAUDID) injection 1 mg (1 mg Intravenous Given 08/22/16 1906)  HYDROmorphone (DILAUDID) injection 1 mg (1 mg Intravenous Given 08/22/16 1817)  HYDROmorphone (DILAUDID) injection 1 mg (1 mg Intravenous Given 08/22/16 2036)     Initial Impression / Assessment and Plan / ED Course  I have reviewed the triage vital signs and the nursing notes.  Pertinent labs & imaging results that were available during my care of the patient were reviewed by me and considered in my medical decision making (see chart for details).   patient was treated in the emergency room for a sickle cell crisis. Symptoms have improved  with treatment. Laboratory tests are unchanged from his baseline. Chest x-ray is not showing any evidence of infiltrate.  At this time I doubt acute chest syndrome or PE. Patient states he is feeling well enough to go home. He has plans to follow up in the sickle cell clinic tomorrow.  Final Clinical Impressions(s) / ED Diagnoses   Final diagnoses:  Sickle cell pain crisis [facility])    New Prescriptions Current Discharge Medication List       Linwood Dibbles, MD 08/22/16 2124

## 2016-08-22 NOTE — ED Notes (Signed)
ED Provider at bedside. 

## 2016-08-22 NOTE — Discharge Instructions (Signed)
Follow-up in  the sickle cell clinic tomorrow as planned

## 2016-08-22 NOTE — ED Triage Notes (Signed)
Per pt, states sickle cell pain since this am-states leg pain radiating to back and now chest-took percocet this am, no pain meds since-has not been drinking fluids sue to increased pain

## 2016-08-23 ENCOUNTER — Non-Acute Institutional Stay (HOSPITAL_BASED_OUTPATIENT_CLINIC_OR_DEPARTMENT_OTHER)
Admission: AD | Admit: 2016-08-23 | Discharge: 2016-08-23 | Disposition: A | Discharge status: Home / Self Care | Payer: Medicare Other | Source: Ambulatory Visit | Attending: Internal Medicine | Admitting: Internal Medicine

## 2016-08-23 ENCOUNTER — Ambulatory Visit: Payer: Self-pay | Admitting: Family Medicine

## 2016-08-23 ENCOUNTER — Other Ambulatory Visit: Payer: Self-pay | Admitting: Family Medicine

## 2016-08-23 ENCOUNTER — Encounter (HOSPITAL_COMMUNITY): Payer: Self-pay | Admitting: Emergency Medicine

## 2016-08-23 ENCOUNTER — Emergency Department (HOSPITAL_COMMUNITY)
Admission: EM | Admit: 2016-08-23 | Discharge: 2016-08-23 | Disposition: A | Discharge status: Home / Self Care | Payer: Medicare Other | Source: Home / Self Care | Attending: Emergency Medicine | Admitting: Emergency Medicine

## 2016-08-23 ENCOUNTER — Encounter (HOSPITAL_COMMUNITY): Payer: Self-pay | Admitting: *Deleted

## 2016-08-23 DIAGNOSIS — D5700 Hb-SS disease with crisis, unspecified: Secondary | ICD-10-CM | POA: Diagnosis not present

## 2016-08-23 DIAGNOSIS — D571 Sickle-cell disease without crisis: Secondary | ICD-10-CM

## 2016-08-23 DIAGNOSIS — D57219 Sickle-cell/Hb-C disease with crisis, unspecified: Secondary | ICD-10-CM | POA: Diagnosis not present

## 2016-08-23 DIAGNOSIS — Z79899 Other long term (current) drug therapy: Secondary | ICD-10-CM

## 2016-08-23 LAB — CBC WITH DIFFERENTIAL/PLATELET
BASOS PCT: 1 %
Basophils Absolute: 0.1 10*3/uL (ref 0.0–0.1)
Eosinophils Absolute: 0 10*3/uL (ref 0.0–0.7)
Eosinophils Relative: 0 %
HEMATOCRIT: 20.3 % — AB (ref 39.0–52.0)
Hemoglobin: 7.2 g/dL — ABNORMAL LOW (ref 13.0–17.0)
LYMPHS ABS: 2.6 10*3/uL (ref 0.7–4.0)
Lymphocytes Relative: 18 %
MCH: 28.1 pg (ref 26.0–34.0)
MCHC: 35.5 g/dL (ref 30.0–36.0)
MCV: 79.3 fL (ref 78.0–100.0)
MONO ABS: 2 10*3/uL — AB (ref 0.1–1.0)
Monocytes Relative: 14 %
NEUTROS ABS: 9.9 10*3/uL — AB (ref 1.7–7.7)
NEUTROS PCT: 67 %
NRBC: 6 /100{WBCs} — AB
Platelets: 349 10*3/uL (ref 150–400)
RBC: 2.56 MIL/uL — ABNORMAL LOW (ref 4.22–5.81)
RDW: 29.2 % — ABNORMAL HIGH (ref 11.5–15.5)
WBC: 14.6 10*3/uL — ABNORMAL HIGH (ref 4.0–10.5)

## 2016-08-23 LAB — COMPREHENSIVE METABOLIC PANEL
ALK PHOS: 56 U/L (ref 38–126)
ALT: 24 U/L (ref 17–63)
AST: 57 U/L — AB (ref 15–41)
Albumin: 3.8 g/dL (ref 3.5–5.0)
Anion gap: 7 (ref 5–15)
CALCIUM: 8.3 mg/dL — AB (ref 8.9–10.3)
CO2: 24 mmol/L (ref 22–32)
CREATININE: 0.69 mg/dL (ref 0.61–1.24)
Chloride: 103 mmol/L (ref 101–111)
GFR calc non Af Amer: >60 mL/min (ref >60)
GLUCOSE: 101 mg/dL — AB (ref 65–99)
Potassium: 3.9 mmol/L (ref 3.5–5.1)
SODIUM: 134 mmol/L — AB (ref 135–145)
Total Bilirubin: 4.7 mg/dL — ABNORMAL HIGH (ref 0.3–1.2)
Total Protein: 7.6 g/dL (ref 6.5–8.1)

## 2016-08-23 LAB — RETICULOCYTES
RBC.: 2.56 MIL/uL — ABNORMAL LOW (ref 4.22–5.81)
Retic Count, Absolute: 368.6 10*3/uL — ABNORMAL HIGH (ref 19.0–186.0)
Retic Ct Pct: 14.4 % — ABNORMAL HIGH (ref 0.4–3.1)

## 2016-08-23 MED ORDER — HYDROMORPHONE 1 MG/ML IV SOLN
INTRAVENOUS | Status: DC
Start: 2016-08-23 — End: 2016-08-23
  Administered 2016-08-23: 12:00:00 via INTRAVENOUS
  Administered 2016-08-23: 2.8 mg via INTRAVENOUS
  Filled 2016-08-23: qty 25

## 2016-08-23 MED ORDER — HYDROMORPHONE HCL 2 MG/ML IJ SOLN: 0.5000 mg | INTRAMUSCULAR | Status: DC

## 2016-08-23 MED ORDER — KETOROLAC TROMETHAMINE 30 MG/ML IJ SOLN
30.0000 mg | Freq: Once | INTRAMUSCULAR | Status: AC
  Administered 2016-08-23: 30 mg via INTRAVENOUS
  Filled 2016-08-23: qty 1

## 2016-08-23 MED ORDER — HYDROMORPHONE HCL 2 MG/ML IJ SOLN: 0.5000 mg | INTRAMUSCULAR | Status: AC

## 2016-08-23 MED ORDER — OXYCODONE-ACETAMINOPHEN 5-325 MG PO TABS
1.0000 | ORAL_TABLET | Freq: Once | ORAL | Status: AC
  Administered 2016-08-23: 1 via ORAL
  Filled 2016-08-23: qty 1

## 2016-08-23 MED ORDER — DIPHENHYDRAMINE HCL 25 MG PO CAPS
25.0000 mg | ORAL_CAPSULE | ORAL | Status: DC | PRN
  Filled 2016-08-23: qty 1

## 2016-08-23 MED ORDER — DIPHENHYDRAMINE HCL 50 MG/ML IJ SOLN: 12.5000 mg | Freq: Four times a day (QID) | INTRAMUSCULAR | Status: DC | PRN

## 2016-08-23 MED ORDER — SODIUM CHLORIDE 0.9% FLUSH: 9.0000 mL | INTRAVENOUS | Status: DC | PRN

## 2016-08-23 MED ORDER — IBUPROFEN 600 MG PO TABS: 600.0000 mg | ORAL_TABLET | Freq: Three times a day (TID) | ORAL | 2 refills | Status: DC | PRN

## 2016-08-23 MED ORDER — DEXTROSE-NACL 5-0.45 % IV SOLN
INTRAVENOUS | Status: DC
  Administered 2016-08-23: 12:00:00 via INTRAVENOUS

## 2016-08-23 MED ORDER — NALOXONE HCL 0.4 MG/ML IJ SOLN: 0.4000 mg | INTRAMUSCULAR | Status: DC | PRN

## 2016-08-23 MED ORDER — KETOROLAC TROMETHAMINE 15 MG/ML IJ SOLN
15.0000 mg | INTRAMUSCULAR | Status: AC
  Administered 2016-08-23: 15 mg via INTRAVENOUS
  Filled 2016-08-23: qty 1

## 2016-08-23 MED ORDER — SODIUM CHLORIDE 0.45 % IV SOLN
INTRAVENOUS | Status: DC
  Administered 2016-08-23: 07:00:00 via INTRAVENOUS

## 2016-08-23 MED ORDER — OXYCODONE-ACETAMINOPHEN 5-325 MG PO TABS
1.0000 | ORAL_TABLET | Freq: Four times a day (QID) | ORAL | 0 refills | Status: DC | PRN
Start: 2016-08-23 — End: 2017-02-20

## 2016-08-23 MED ORDER — ONDANSETRON HCL 4 MG/2ML IJ SOLN
4.0000 mg | INTRAMUSCULAR | Status: DC | PRN
  Administered 2016-08-23: 4 mg via INTRAVENOUS
  Filled 2016-08-23: qty 2

## 2016-08-23 MED ORDER — DIPHENHYDRAMINE HCL 12.5 MG/5ML PO ELIX: 12.5000 mg | ORAL_SOLUTION | Freq: Four times a day (QID) | ORAL | Status: DC | PRN

## 2016-08-23 MED ORDER — HYDROMORPHONE HCL 2 MG/ML IJ SOLN
0.5000 mg | INTRAMUSCULAR | Status: DC
  Administered 2016-08-23: 1 mg via INTRAVENOUS

## 2016-08-23 MED ORDER — HYDROMORPHONE HCL 2 MG/ML IJ SOLN
0.5000 mg | INTRAMUSCULAR | Status: AC
  Administered 2016-08-23: 1 mg via INTRAVENOUS

## 2016-08-23 MED ORDER — HYDROMORPHONE HCL 2 MG/ML IJ SOLN
0.5000 mg | INTRAMUSCULAR | Status: AC
  Filled 2016-08-23: qty 1

## 2016-08-23 MED ORDER — ONDANSETRON HCL 4 MG/2ML IJ SOLN: 4.0000 mg | Freq: Four times a day (QID) | INTRAMUSCULAR | Status: DC | PRN

## 2016-08-23 NOTE — ED Provider Notes (Signed)
[facility] Provider Note   CSN: [id number] Arrival date & time: 08/23/16  0506     History   Chief Complaint Chief Complaint  Patient presents with  . Sickle Cell Pain Crisis    HPI Nicolas Rogers is a 21 y.o. male.  HPI Nicolas Rogers is a 21 y.o. male with hx of sickle cell anemia, Presents to emergency department complaining of right leg and lower back pain onset yesterday early morning. Patient was evaluated in emergency department last night, he felt better while in emergency department but when got home pain returned. He was given prescription for Percocet but was unable to fill it yet. He usually takes ibuprofen for his pain and 2 tablets in the relief. He denies any swelling or redness to the joint. Denies any skin discoloration of her painful areas. He denies any fever or chills. No chest pain, cough, shortness of breath. He denies any injuries. He states ambulating makes pain worse, nothing making it better.  Past Medical History:  Diagnosis Date  . Cholecystolithiasis   . Sickle cell anemia [facility])     Patient Active Problem List   Diagnosis Date Noted  . Vitamin D deficiency 03/12/2014  . Need for immunization against influenza 03/12/2014  . 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 ADENOIDECTOMY    . TONSILLECTOMY         Home Medications    Prior to Admission medications   Medication Sig Start Date End Date Taking? Authorizing Provider  folic acid (FOLVITE) 1 MG tablet Take 1 tablet (1 mg total) by mouth every morning. 04/29/16  Yes Massie Maroon, FNP  hydroxyurea (HYDREA) 500 MG capsule Take 2 capsules (1,000 mg total) by mouth every morning. May take with food to minimize GI side effects. 04/29/16  Yes Massie Maroon, FNP  ibuprofen (ADVIL,MOTRIN) 600 MG tablet Take 1 tablet (600 mg total) by mouth every 8 (eight) hours as needed. Patient taking differently: Take 600 mg by mouth  every 8 (eight) hours as needed for moderate pain.  04/29/16  Yes Massie Maroon, FNP  oxyCODONE-acetaminophen (PERCOCET/ROXICET) 5-325 MG tablet Take 1 tablet by mouth every 4 (four) hours as needed for severe pain. 08/22/16  Yes Linwood Dibbles, MD  Vitamin D, Ergocalciferol, (DRISDOL) 50000 units CAPS capsule Take 50,000 Units by mouth every Wednesday.   Yes Historical Provider, MD    Family History History reviewed. No pertinent family history.  Social History Social History  Substance Use Topics  . Smoking status: Never Smoker  . Smokeless tobacco: Never Used  . Alcohol use No     Allergies   Patient has no known allergies.   Review of Systems Review of Systems  Constitutional: Negative for chills and fever.  Respiratory: Negative for cough, chest tightness and shortness of breath.   Cardiovascular: Negative for chest pain, palpitations and leg swelling.  Gastrointestinal: Negative for abdominal distention, abdominal pain, diarrhea, nausea and vomiting.  Genitourinary: Negative for dysuria, frequency, hematuria and urgency.  Musculoskeletal: Positive for arthralgias and myalgias. Negative for neck pain and neck stiffness.  Skin: Negative for rash.  Allergic/Immunologic: Negative for immunocompromised state.  Neurological: Negative for dizziness, weakness, light-headedness, numbness and headaches.  All other systems reviewed and are negative.    Physical Exam Updated Vital Signs BP 125/62 (BP Location: Left Arm)   Pulse 88   Temp 99.5 F (37.5 C) (Oral)   Resp 16   Ht  6' (1.829 m)   Wt 70.8 kg   SpO2 91%   BMI 21.16 kg/m   Physical Exam  Constitutional: He appears well-developed and well-nourished. No distress.  HENT:  Head: Normocephalic and atraumatic.  Eyes: Conjunctivae are normal.  Neck: Neck supple.  Cardiovascular: Normal rate, regular rhythm and normal heart sounds.   Pulmonary/Chest: Effort normal. No respiratory distress. He has no wheezes. He has no  rales.  Abdominal: Soft. Bowel sounds are normal. He exhibits no distension. There is no tenderness. There is no rebound.  Musculoskeletal: He exhibits no edema.  Normal appearing lower back and right lower leg. TTP over right anterior shin. No calf tenderness. DP pulse intact.   Neurological: He is alert.  Skin: Skin is warm and dry.  Nursing note and vitals reviewed.    ED Treatments / Results  Labs (all labs ordered are listed, but only abnormal results are displayed) Labs Reviewed  COMPREHENSIVE METABOLIC PANEL - Abnormal; Notable for the following:       Result Value   Sodium 134 (*)    Glucose, Bld 101 (*)    BUN <5 (*)    Calcium 8.3 (*)    AST 57 (*)    Total Bilirubin 4.7 (*)    All other components within normal limits  CBC WITH DIFFERENTIAL/PLATELET - Abnormal; Notable for the following:    WBC 14.6 (*)    RBC 2.56 (*)    Hemoglobin 7.2 (*)    HCT 20.3 (*)    RDW 29.2 (*)    nRBC 6 (*)    Neutro Abs 9.9 (*)    Monocytes Absolute 2.0 (*)    All other components within normal limits  RETICULOCYTES - Abnormal; Notable for the following:    Retic Ct Pct 14.4 (*)    RBC. 2.56 (*)    Retic Count, Manual 368.6 (*)    All other components within normal limits    EKG  EKG Interpretation None       Radiology Dg Chest 2 View  Result Date: 08/22/2016 CLINICAL DATA:  Sickle cell crisis beginning today, chest pain. EXAM: CHEST  2 VIEW COMPARISON:  Chest radiograph November 19, 2013 FINDINGS: Cardiac silhouette is top-normal in size, mediastinal silhouette is unremarkable. Mild bronchitic changes. No pleural effusions or focal consolidations. Trachea projects midline and there is no pneumothorax. Soft tissue planes and included osseous structures are non-suspicious. Surgical clips in the included right abdomen compatible with cholecystectomy. IMPRESSION: Borderline cardiomegaly. Mild bronchitic changes without focal consolidation. Electronically Signed   By: Awilda Metro M.D.   On: 08/22/2016 17:14    Procedures Procedures (including critical care time)  Medications Ordered in ED Medications  0.45 % sodium chloride infusion ( Intravenous New Bag/Given 08/23/16 0706)  HYDROmorphone (DILAUDID) injection 0.5-1 mg (not administered)    Or  HYDROmorphone (DILAUDID) injection 0.5-1 mg (not administered)  HYDROmorphone (DILAUDID) injection 0.5-1 mg (not administered)    Or  HYDROmorphone (DILAUDID) injection 0.5-1 mg (not administered)  HYDROmorphone (DILAUDID) injection 0.5-1 mg (not administered)    Or  HYDROmorphone (DILAUDID) injection 0.5-1 mg (not administered)  HYDROmorphone (DILAUDID) injection 0.5-1 mg (not administered)    Or  HYDROmorphone (DILAUDID) injection 0.5-1 mg (not administered)  diphenhydrAMINE (BENADRYL) capsule 25-50 mg (not administered)  ondansetron (ZOFRAN) injection 4 mg (not administered)  ketorolac (TORADOL) 15 MG/ML injection 15 mg (15 mg Intravenous Given 08/23/16 0707)     Initial Impression / Assessment and Plan / ED Course  I have  reviewed the triage vital signs and the nursing notes.  Pertinent labs & imaging results that were available during my care of the patient were reviewed by me and considered in my medical decision making (see chart for details).    Patient returned with sickle cell crisis, pain in lower back and leg. No chest pain, no shortness of breath, no cough, doubt acute chest. Will check labs. Will try fluids and pain medications. I will call the sickle cell clinic if patient is medically cleared to see if they would accept patient for further treatment today.   8:42 AM CMP back, but CBC and retic count not resulted. Discussed with lab, they do not have appropriate tubes. RN aware and will draw labs. Also discovered that his pain medications which were placed at 7:15, 1.5hrs ago were not given yet. RN to give now.   PT received 2 doses of dilaudid. Pain is at 7. Discussed with sickle cell  clinic, will accept for further treatment.   Vitals:   08/23/16 0850 08/23/16 0851 08/23/16 1039 08/23/16 1129  BP:   108/63 107/67  Pulse:   67 72  Resp:   15 14  Temp:      TempSrc:      SpO2: 98%  97% 96%  Weight:  70.8 kg    Height:  6' (1.829 m)       Final Clinical Impressions(s) / ED Diagnoses   Final diagnoses:  Sickle cell crisis [facility])    New Prescriptions Discharge Medication List as of 08/23/2016 11:14 AM       Jaynie Crumble, PA-C 08/23/16 1517    Canary Brim Tegeler, MD 08/23/16 2103

## 2016-08-23 NOTE — ED Triage Notes (Signed)
Pt reports being seen on 08/22/16 for sickle cell pain and was discharged. Pt reports that after returning home pain became worse and is in right leg and back.

## 2016-08-23 NOTE — Progress Notes (Signed)
Reviewed [place] Substance Reporting system prior to prescribing opiate medications. No inconsistencies noted.   Meds ordered this encounter  Medications  . oxyCODONE-acetaminophen (PERCOCET/ROXICET) 5-325 MG tablet    Sig: Take 1 tablet by mouth every 6 (six) hours as needed for severe pain.    Dispense:  30 tablet    Refill:  0    Order Specific Question:   Supervising Provider    Answer:   Quentin Angst [id number]  . ibuprofen (ADVIL,MOTRIN) 600 MG tablet    Sig: Take 1 tablet (600 mg total) by mouth every 8 (eight) hours as needed.    Dispense:  30 tablet    Refill:  2    Nolon Nations  MSN, FNP-C [facility] [address] [place], [place] [address] [phone]

## 2016-08-23 NOTE — Discharge Summary (Signed)
[facility] Discharge Summary   Patient ID: Nicolas Rogers MRN: [id number] DOB/AGE: 07-10-1995 21 y.o.  Admit date: 08/23/2016 Discharge date: 08/23/2016  Primary Care Physician:  Massie Maroon, FNP  Admission Diagnoses:  Active Problems:   Sickle-cell crisis [facility])   Discharge Medications:  Current Meds  Medication Sig  . folic acid (FOLVITE) 1 MG tablet Take 1 tablet (1 mg total) by mouth every morning.  . hydroxyurea (HYDREA) 500 MG capsule Take 2 capsules (1,000 mg total) by mouth every morning. May take with food to minimize GI side effects.  . Vitamin D, Ergocalciferol, (DRISDOL) 50000 units CAPS capsule Take 50,000 Units by mouth every Wednesday.  . [DISCONTINUED] oxyCODONE-acetaminophen (PERCOCET/ROXICET) 5-325 MG tablet Take 1 tablet by mouth every 4 (four) hours as needed for severe pain.     Consults:  None  Significant Diagnostic Studies:  Dg Chest 2 View  Result Date: 08/22/2016 CLINICAL DATA:  Sickle cell crisis beginning today, chest pain. EXAM: CHEST  2 VIEW COMPARISON:  Chest radiograph November 19, 2013 FINDINGS: Cardiac silhouette is top-normal in size, mediastinal silhouette is unremarkable. Mild bronchitic changes. No pleural effusions or focal consolidations. Trachea projects midline and there is no pneumothorax. Soft tissue planes and included osseous structures are non-suspicious. Surgical clips in the included right abdomen compatible with cholecystectomy. IMPRESSION: Borderline cardiomegaly. Mild bronchitic changes without focal consolidation. Electronically Signed   By: Awilda Metro M.D.   On: 08/22/2016 17:14     [facility] Course: Nicolas Rogers, a 21 year old male with a history of sickle cell anemia SS presents to clinic complaining of pain to bilateral lower extremities. Patient transitioned from the emergency department for extended observation. Reviewed labs, mild leukocytosis. Patient is opiate naive. He had 2  mg of Dilaudid and Toradol this am in the emergency department. Patient was started on D5.45 at 150 ml/hour for cellular rehydration.  Mr. Erven was started on a full dose PCA with dilaudid for pain management with a 1 mg loading dose. He used a total of 2.8 mg with 6 demands and 6 deliveries. Pain intensity decreased from 7/10 to 0/10. He was also given Percocet 5-325 mg times one 45 minutes prior to discharge. He is alert, oriented, and ambulating. He will discharge home with mother in stable condition.   Discharge Instructions: Patient received Rx for Percocet 5-325 mg every 6 hours as needed for moderate to severe pain # 30 Resume all other medications as previously prescribed.  Increase fluid intake to 64 ounces per day   Physical Exam at Discharge:  BP 123/78 (BP Location: Left Arm)   Pulse 89   Temp 99.5 F (37.5 C) (Oral)   Resp 12   Ht 6' (1.829 m)   Wt 156 lb (70.8 kg)   SpO2 100%   BMI 21.16 kg/m  BP 123/78 (BP Location: Left Arm)   Pulse 89   Temp 99.5 F (37.5 C) (Oral)   Resp 12   Ht 6' (1.829 m)   Wt 156 lb (70.8 kg)   SpO2 100%   BMI 21.16 kg/m   General Appearance:    Alert, cooperative, no distress, appears stated age  Head:    Normocephalic, without obvious abnormality, atraumatic  Lungs:     Clear to auscultation bilaterally, respirations unlabored  Chest wall:    No tenderness or deformity  Heart:    Regular rate and rhythm, S1 and S2 normal, no murmur, rub   or gallop  Extremities:  Extremities normal, atraumatic, no cyanosis or edema  Pulses:   2+ and symmetric all extremities  Skin:   Skin color, texture, turgor normal, no rashes or lesions   Disposition at Discharge: 01-Home or Self Care  Discharge Orders: Discharge Instructions    Discharge patient    Complete by:  As directed    Discharge disposition:  01-Home or Self Care   Discharge patient date:  08/23/2016      Condition at Discharge:   Stable  Time spent on Discharge:  Greater  than 30 minutes.  Signed: Mellina Benison M 08/23/2016, 4:55 PM

## 2016-08-23 NOTE — ED Notes (Signed)
Called lab and confirmed they rec'd blood tubes and will run a retic count and CBC

## 2016-08-23 NOTE — Discharge Instructions (Signed)
Go to sickle cell clinic. °

## 2016-08-23 NOTE — H&P (Signed)
[facility] History and Physical   Date: 08/23/2016  Patient name: Nicolas Rogers Medical record number: [id number] Date of birth: 30-Oct-1995 Age: 21 y.o. Gender: male PCP: Massie Maroon, FNP  Attending physician: Quentin Angst, MD  Chief Complaint: Bilateral lower extremity pain  History of Present Illness: Nicolas Rogers, a 21 year old male with a history of sickle cell anemia, HbSS presents complaining of pain to bilateral lower extremities. Patient was treated and evaluated in the emergency department on 4/16 and 4/17 without sustained pain relief. Patient transitioned from the emergency department in stable condition. Current pain intensity is 7/10 described as constant and aching. Patient does not have any opiate medications at home. He was given a prescription for percocet in the emergency department, but he did not get the prescription filled due to medication contract. He denies headache, shortness of breath, chest pain, abdominal pain, dysuria, nausea, vomiting, or diarrhea.  Meds: Prescriptions Prior to Admission  Medication Sig Dispense Refill Last Dose  . folic acid (FOLVITE) 1 MG tablet Take 1 tablet (1 mg total) by mouth every morning. 30 tablet 11 Past Week at Unknown time  . hydroxyurea (HYDREA) 500 MG capsule Take 2 capsules (1,000 mg total) by mouth every morning. May take with food to minimize GI side effects. 60 capsule 5 Past Week at Unknown time  . ibuprofen (ADVIL,MOTRIN) 600 MG tablet Take 1 tablet (600 mg total) by mouth every 8 (eight) hours as needed. (Patient taking differently: Take 600 mg by mouth every 8 (eight) hours as needed for moderate pain. ) 30 tablet 2 Past Week at Unknown time  . oxyCODONE-acetaminophen (PERCOCET/ROXICET) 5-325 MG tablet Take 1 tablet by mouth every 4 (four) hours as needed for severe pain. 12 tablet 0 08/22/2016 at 1900  . Vitamin D, Ergocalciferol, (DRISDOL) 50000 units CAPS capsule Take 50,000 Units by mouth  every Wednesday.   08/17/2016 at unknown    Allergies: Patient has no known allergies. Past Medical History:  Diagnosis Date  . Cholecystolithiasis   . Sickle cell anemia (HCC)    Past Surgical History:  Procedure Laterality Date  . ADENOIDECTOMY    . TONSILLECTOMY     No family history on file. Social History   Social History  . Marital status: Single    Spouse name: N/A  . Number of children: N/A  . Years of education: N/A   Occupational History  . Not on file.   Social History Main Topics  . Smoking status: Never Smoker  . Smokeless tobacco: Never Used  . Alcohol use No  . Drug use: No  . Sexual activity: Not on file   Other Topics Concern  . Not on file   Social History Narrative  . No narrative on file    Review of Systems: Constitutional: negative for fatigue and night sweats Eyes: negative for icterus Respiratory: negative for dyspnea on exertion and wheezing Cardiovascular: negative for chest pain, dyspnea, irregular heart beat and lower extremity edema Gastrointestinal: negative for constipation, diarrhea and nausea Genitourinary:negative Musculoskeletal:positive for myalgias Neurological: negative for dizziness, gait problems, vertigo and weakness  Physical Exam: There were no vitals taken for this visit. BP 123/78 (BP Location: Left Arm)   Pulse 89   Temp 99.5 F (37.5 C) (Oral)   Resp 12   Ht 6' (1.829 m)   Wt 156 lb (70.8 kg)   SpO2 100%   BMI 21.16 kg/m   General Appearance:    Alert, cooperative, mild distress, appears  stated age  Head:    Normocephalic, without obvious abnormality, atraumatic  Eyes:    PERRL, conjunctiva/corneas clear, EOM's intact, fundi    benign, both eyes       Back:     Symmetric, no curvature, ROM normal, no CVA tenderness  Lungs:     Clear to auscultation bilaterally, respirations unlabored  Chest wall:    No tenderness or deformity  Heart:    Regular rate and rhythm, S1 and S2 normal, no murmur, rub   or  gallop  Abdomen:     Soft, non-tender, bowel sounds active all four quadrants,    no masses, no organomegaly  Extremities:   Extremities normal, atraumatic, no cyanosis or edema  Pulses:   2+ and symmetric all extremities  Skin:   Skin color, texture, turgor normal, no rashes or lesions  Lymph nodes:   Cervical, supraclavicular, and axillary nodes normal  Neurologic:   CNII-XII intact. Normal strength, sensation and reflexes      throughout    Lab results: Results for orders placed or performed during the hospital encounter of 08/23/16 (from the past 24 hour(s))  Comprehensive metabolic panel     Status: Abnormal   Collection Time: 08/23/16  6:23 AM  Result Value Ref Range   Sodium 134 (L) 135 - 145 mmol/L   Potassium 3.9 3.5 - 5.1 mmol/L   Chloride 103 101 - 111 mmol/L   CO2 24 22 - 32 mmol/L   Glucose, Bld 101 (H) 65 - 99 mg/dL   BUN <5 (L) 6 - 20 mg/dL   Creatinine, Ser 7.82 0.61 - 1.24 mg/dL   Calcium 8.3 (L) 8.9 - 10.3 mg/dL   Total Protein 7.6 6.5 - 8.1 g/dL   Albumin 3.8 3.5 - 5.0 g/dL   AST 57 (H) 15 - 41 U/L   ALT 24 17 - 63 U/L   Alkaline Phosphatase 56 38 - 126 U/L   Total Bilirubin 4.7 (H) 0.3 - 1.2 mg/dL   GFR calc non Af Amer >60 >60 mL/min   GFR calc Af Amer >60 >60 mL/min   Anion gap 7 5 - 15  CBC with Differential/Platelet     Status: Abnormal   Collection Time: 08/23/16  8:45 AM  Result Value Ref Range   WBC 14.6 (H) 4.0 - 10.5 K/uL   RBC 2.56 (L) 4.22 - 5.81 MIL/uL   Hemoglobin 7.2 (L) 13.0 - 17.0 g/dL   HCT 95.6 (L) 21.3 - 08.6 %   MCV 79.3 78.0 - 100.0 fL   MCH 28.1 26.0 - 34.0 pg   MCHC 35.5 30.0 - 36.0 g/dL   RDW 57.8 (H) 46.9 - 62.9 %   Platelets 349 150 - 400 K/uL   Neutrophils Relative % 67 %   Lymphocytes Relative 18 %   Monocytes Relative 14 %   Eosinophils Relative 0 %   Basophils Relative 1 %   nRBC 6 (H) 0 /100 WBC   Neutro Abs 9.9 (H) 1.7 - 7.7 K/uL   Lymphs Abs 2.6 0.7 - 4.0 K/uL   Monocytes Absolute 2.0 (H) 0.1 - 1.0 K/uL    Eosinophils Absolute 0.0 0.0 - 0.7 K/uL   Basophils Absolute 0.1 0.0 - 0.1 K/uL   RBC Morphology POLYCHROMASIA PRESENT    WBC Morphology MILD LEFT SHIFT (1-5% METAS, OCC MYELO, OCC BANDS)    Smear Review LARGE PLATELETS PRESENT   Reticulocytes     Status: Abnormal   Collection Time: 08/23/16  8:45 AM  Result Value Ref Range   Retic Ct Pct 14.4 (H) 0.4 - 3.1 %   RBC. 2.56 (L) 4.22 - 5.81 MIL/uL   Retic Count, Manual 368.6 (H) 19.0 - 186.0 K/uL    Imaging results:  Dg Chest 2 View  Result Date: 08/22/2016 CLINICAL DATA:  Sickle cell crisis beginning today, chest pain. EXAM: CHEST  2 VIEW COMPARISON:  Chest radiograph November 19, 2013 FINDINGS: Cardiac silhouette is top-normal in size, mediastinal silhouette is unremarkable. Mild bronchitic changes. No pleural effusions or focal consolidations. Trachea projects midline and there is no pneumothorax. Soft tissue planes and included osseous structures are non-suspicious. Surgical clips in the included right abdomen compatible with cholecystectomy. IMPRESSION: Borderline cardiomegaly. Mild bronchitic changes without focal consolidation. Electronically Signed   By: Awilda Metro M.D.   On: 08/22/2016 17:14     Assessment & Plan:  Patient transitioned to the day infusion center for extended observation. Report taken from [place], [place]. IV intact.   Start IV D5.45 for cellular rehydration at 150/hr  Start Toradol 30 mg IV time one for inflammation.  Patient opiate naive, will start full dose PCA dilaudid   Patient will be re-evaluated for pain intensity in the context of function and relationship to baseline as care progresses.  If no significant pain relief, will transfer patient to inpatient services for a higher level of care.   Reviewed labs from emergency department, mild leukocytosis present, patient afebrile. Chest xray negative  Alise Calais M 08/23/2016, 12:13 PM

## 2016-08-23 NOTE — Progress Notes (Signed)
Patient treated at the [facility] for pain related to a sickle cell crisis. Upon admission patient's pain level was an 8/10 on pain scale. Patient was treated with IV Fluids, IV Toradol and placed on a Dilaudid PCA. At time of discharge patient reported a pain score of 1/10 on pain scale. Discharge instructions given to patient and patient states an understanding. Patient alert, oriented, and ambulatory at time of discharge

## 2016-08-24 ENCOUNTER — Inpatient Hospital Stay (HOSPITAL_COMMUNITY): Payer: Medicare Other

## 2016-08-24 ENCOUNTER — Emergency Department (HOSPITAL_COMMUNITY)
Admit: 2016-08-24 | Discharge: 2016-08-24 | Disposition: A | Discharge status: Home / Self Care | Payer: Medicare Other | Attending: Physician Assistant | Admitting: Physician Assistant

## 2016-08-24 ENCOUNTER — Emergency Department (HOSPITAL_COMMUNITY): Payer: Medicare Other

## 2016-08-24 ENCOUNTER — Encounter (HOSPITAL_COMMUNITY): Payer: Self-pay | Admitting: Emergency Medicine

## 2016-08-24 ENCOUNTER — Inpatient Hospital Stay (HOSPITAL_COMMUNITY)
Admission: EM | Admit: 2016-08-24 | Discharge: 2016-08-29 | DRG: 811 | Disposition: A | Discharge status: Home / Self Care | Payer: Medicare Other | Attending: Internal Medicine | Admitting: Internal Medicine

## 2016-08-24 ENCOUNTER — Telehealth (HOSPITAL_COMMUNITY): Payer: Self-pay | Admitting: Hematology

## 2016-08-24 DIAGNOSIS — E559 Vitamin D deficiency, unspecified: Secondary | ICD-10-CM | POA: Diagnosis present

## 2016-08-24 DIAGNOSIS — R0902 Hypoxemia: Secondary | ICD-10-CM | POA: Diagnosis not present

## 2016-08-24 DIAGNOSIS — Z79891 Long term (current) use of opiate analgesic: Secondary | ICD-10-CM | POA: Diagnosis not present

## 2016-08-24 DIAGNOSIS — D72829 Elevated white blood cell count, unspecified: Secondary | ICD-10-CM | POA: Diagnosis not present

## 2016-08-24 DIAGNOSIS — N4830 Priapism, unspecified: Secondary | ICD-10-CM | POA: Diagnosis present

## 2016-08-24 DIAGNOSIS — D599 Acquired hemolytic anemia, unspecified: Secondary | ICD-10-CM | POA: Diagnosis not present

## 2016-08-24 DIAGNOSIS — D5701 Hb-SS disease with acute chest syndrome: Secondary | ICD-10-CM | POA: Clinically undetermined

## 2016-08-24 DIAGNOSIS — D5700 Hb-SS disease with crisis, unspecified: Secondary | ICD-10-CM | POA: Diagnosis not present

## 2016-08-24 DIAGNOSIS — J9811 Atelectasis: Secondary | ICD-10-CM | POA: Diagnosis present

## 2016-08-24 DIAGNOSIS — J181 Lobar pneumonia, unspecified organism: Secondary | ICD-10-CM | POA: Diagnosis not present

## 2016-08-24 DIAGNOSIS — M7989 Other specified soft tissue disorders: Secondary | ICD-10-CM

## 2016-08-24 DIAGNOSIS — D638 Anemia in other chronic diseases classified elsewhere: Secondary | ICD-10-CM | POA: Diagnosis present

## 2016-08-24 DIAGNOSIS — R5081 Fever presenting with conditions classified elsewhere: Secondary | ICD-10-CM | POA: Diagnosis present

## 2016-08-24 DIAGNOSIS — R509 Fever, unspecified: Secondary | ICD-10-CM

## 2016-08-24 DIAGNOSIS — J189 Pneumonia, unspecified organism: Secondary | ICD-10-CM | POA: Diagnosis present

## 2016-08-24 DIAGNOSIS — D582 Other hemoglobinopathies: Secondary | ICD-10-CM | POA: Diagnosis not present

## 2016-08-24 DIAGNOSIS — Z79899 Other long term (current) drug therapy: Secondary | ICD-10-CM

## 2016-08-24 DIAGNOSIS — R079 Chest pain, unspecified: Secondary | ICD-10-CM | POA: Diagnosis not present

## 2016-08-24 DIAGNOSIS — R918 Other nonspecific abnormal finding of lung field: Secondary | ICD-10-CM | POA: Diagnosis not present

## 2016-08-24 LAB — CBC WITH DIFFERENTIAL/PLATELET
BASOS ABS: 0.2 10*3/uL — AB (ref 0.0–0.1)
Basophils Relative: 1 %
Eosinophils Absolute: 0.2 10*3/uL (ref 0.0–0.7)
Eosinophils Relative: 1 %
HCT: 19.8 % — ABNORMAL LOW (ref 39.0–52.0)
HEMOGLOBIN: 6.9 g/dL — AB (ref 13.0–17.0)
LYMPHS PCT: 23 %
Lymphs Abs: 3.5 10*3/uL (ref 0.7–4.0)
MCH: 26.5 pg (ref 26.0–34.0)
MCHC: 34.8 g/dL (ref 30.0–36.0)
MCV: 76.2 fL — ABNORMAL LOW (ref 78.0–100.0)
MONOS PCT: 17 %
Monocytes Absolute: 2.6 10*3/uL — ABNORMAL HIGH (ref 0.1–1.0)
NEUTROS PCT: 58 %
Neutro Abs: 8.8 10*3/uL — ABNORMAL HIGH (ref 1.7–7.7)
PLATELETS: 355 10*3/uL (ref 150–400)
RBC: 2.6 MIL/uL — AB (ref 4.22–5.81)
RDW: 29.2 % — ABNORMAL HIGH (ref 11.5–15.5)
WBC: 15.3 10*3/uL — AB (ref 4.0–10.5)

## 2016-08-24 LAB — LACTIC ACID, PLASMA: LACTIC ACID, VENOUS: 0.6 mmol/L (ref 0.5–1.9)

## 2016-08-24 LAB — RETICULOCYTES
RBC.: 2.6 MIL/uL — ABNORMAL LOW (ref 4.22–5.81)
RETIC COUNT ABSOLUTE: 507 10*3/uL — AB (ref 19.0–186.0)
Retic Ct Pct: 19.5 % — ABNORMAL HIGH (ref 0.4–3.1)

## 2016-08-24 LAB — COMPREHENSIVE METABOLIC PANEL
ALBUMIN: 4.1 g/dL (ref 3.5–5.0)
ALK PHOS: 66 U/L (ref 38–126)
ALT: 28 U/L (ref 17–63)
ANION GAP: 8 (ref 5–15)
AST: 68 U/L — ABNORMAL HIGH (ref 15–41)
BUN: <5 mg/dL — ABNORMAL LOW (ref 6–20)
CALCIUM: 8.4 mg/dL — AB (ref 8.9–10.3)
CHLORIDE: 102 mmol/L (ref 101–111)
CO2: 25 mmol/L (ref 22–32)
CREATININE: 0.65 mg/dL (ref 0.61–1.24)
GFR calc Af Amer: >60 mL/min (ref >60)
GFR calc non Af Amer: >60 mL/min (ref >60)
GLUCOSE: 88 mg/dL (ref 65–99)
Potassium: 4.6 mmol/L (ref 3.5–5.1)
SODIUM: 135 mmol/L (ref 135–145)
Total Bilirubin: 4.4 mg/dL — ABNORMAL HIGH (ref 0.3–1.2)
Total Protein: 7.9 g/dL (ref 6.5–8.1)

## 2016-08-24 MED ORDER — KETOROLAC TROMETHAMINE 15 MG/ML IJ SOLN
15.0000 mg | Freq: Four times a day (QID) | INTRAMUSCULAR | Status: DC
  Administered 2016-08-24 – 2016-08-29 (×19): 15 mg via INTRAVENOUS
  Filled 2016-08-24 (×17): qty 1

## 2016-08-24 MED ORDER — DEXTROSE-NACL 5-0.45 % IV SOLN
INTRAVENOUS | Status: DC
  Administered 2016-08-24 – 2016-08-25 (×2): via INTRAVENOUS
  Administered 2016-08-28: 1000 mL via INTRAVENOUS

## 2016-08-24 MED ORDER — FOLIC ACID 1 MG PO TABS
1.0000 mg | ORAL_TABLET | Freq: Every morning | ORAL | Status: DC
  Administered 2016-08-25 – 2016-08-29 (×5): 1 mg via ORAL
  Filled 2016-08-24 (×6): qty 1

## 2016-08-24 MED ORDER — PHENYLEPHRINE 200 MCG/ML FOR PRIAPISM / HYPOTENSION
50.0000 ug | Freq: Once | INTRAMUSCULAR | Status: AC
  Administered 2016-08-24: 50 ug via INTRACAVERNOUS
  Filled 2016-08-24: qty 50

## 2016-08-24 MED ORDER — SODIUM CHLORIDE 0.9 % IV BOLUS (SEPSIS)
500.0000 mL | Freq: Once | INTRAVENOUS | Status: AC
  Administered 2016-08-24: 500 mL via INTRAVENOUS

## 2016-08-24 MED ORDER — HYDROMORPHONE HCL 2 MG/ML IJ SOLN
0.5000 mg | INTRAMUSCULAR | Status: AC
  Administered 2016-08-24: 1 mg via INTRAVENOUS
  Filled 2016-08-24: qty 1

## 2016-08-24 MED ORDER — MORPHINE SULFATE 2 MG/ML IV SOLN
INTRAVENOUS | Status: DC
  Administered 2016-08-24: 19:00:00 via INTRAVENOUS
  Administered 2016-08-25: 6 mg via INTRAVENOUS
  Administered 2016-08-25: 18 mg via INTRAVENOUS
  Administered 2016-08-25: 0 mg via INTRAVENOUS
  Administered 2016-08-25: 18 mg via INTRAVENOUS
  Administered 2016-08-25: 4.5 mg via INTRAVENOUS
  Administered 2016-08-25: 3 mg via INTRAVENOUS
  Administered 2016-08-25: 18:00:00 via INTRAVENOUS
  Administered 2016-08-26 (×2): 9 mg via INTRAVENOUS
  Administered 2016-08-26: 13.5 mg via INTRAVENOUS
  Administered 2016-08-26: 8.46 mg via INTRAVENOUS
  Administered 2016-08-26: 19:00:00 via INTRAVENOUS
  Administered 2016-08-26: 9 mg via INTRAVENOUS
  Administered 2016-08-26: 12 mg via INTRAVENOUS
  Administered 2016-08-27: 6 mg via INTRAVENOUS
  Administered 2016-08-27: 0 mg via INTRAVENOUS
  Administered 2016-08-27: 3 mg via INTRAVENOUS
  Administered 2016-08-27: 1 mg via INTRAVENOUS
  Administered 2016-08-27: 3 mg via INTRAVENOUS
  Administered 2016-08-27: 6 mg via INTRAVENOUS
  Administered 2016-08-28: 1.5 mg via INTRAVENOUS
  Administered 2016-08-28: 6 mg via INTRAVENOUS
  Administered 2016-08-28: 7.5 mg via INTRAVENOUS
  Administered 2016-08-28: 4.5 mg via INTRAVENOUS
  Administered 2016-08-28: 3 mg via INTRAVENOUS
  Administered 2016-08-28: 0 mg via INTRAVENOUS
  Administered 2016-08-29: 3 mg via INTRAVENOUS
  Administered 2016-08-29: 1.5 mg via INTRAVENOUS
  Administered 2016-08-29: 0 mg via INTRAVENOUS
  Filled 2016-08-24 (×2): qty 30

## 2016-08-24 MED ORDER — ONDANSETRON HCL 4 MG/2ML IJ SOLN: 4.0000 mg | Freq: Four times a day (QID) | INTRAMUSCULAR | Status: DC | PRN

## 2016-08-24 MED ORDER — HYDROXYUREA 500 MG PO CAPS: 1000.0000 mg | ORAL_CAPSULE | Freq: Every morning | ORAL | Status: DC

## 2016-08-24 MED ORDER — ACETAMINOPHEN 325 MG PO TABS
650.0000 mg | ORAL_TABLET | Freq: Four times a day (QID) | ORAL | Status: DC | PRN
  Administered 2016-08-24 – 2016-08-27 (×5): 650 mg via ORAL
  Filled 2016-08-24 (×5): qty 2

## 2016-08-24 MED ORDER — ENOXAPARIN SODIUM 40 MG/0.4ML ~~LOC~~ SOLN
40.0000 mg | SUBCUTANEOUS | Status: DC
  Administered 2016-08-25 – 2016-08-27 (×3): 40 mg via SUBCUTANEOUS
  Filled 2016-08-24 (×5): qty 0.4

## 2016-08-24 MED ORDER — HYDROMORPHONE HCL 2 MG/ML IJ SOLN: 0.5000 mg | INTRAMUSCULAR | Status: AC

## 2016-08-24 MED ORDER — ONDANSETRON HCL 4 MG/2ML IJ SOLN
4.0000 mg | Freq: Once | INTRAMUSCULAR | Status: AC
  Administered 2016-08-24: 4 mg via INTRAVENOUS
  Filled 2016-08-24: qty 2

## 2016-08-24 MED ORDER — DIPHENHYDRAMINE HCL 25 MG PO CAPS
25.0000 mg | ORAL_CAPSULE | Freq: Four times a day (QID) | ORAL | Status: DC
  Administered 2016-08-24 – 2016-08-29 (×16): 25 mg via ORAL
  Filled 2016-08-24 (×17): qty 1

## 2016-08-24 MED ORDER — SODIUM CHLORIDE 0.9 % IV SOLN
25.0000 mg | Freq: Four times a day (QID) | INTRAVENOUS | Status: DC
  Administered 2016-08-27: 25 mg via INTRAVENOUS
  Filled 2016-08-24 (×21): qty 0.5

## 2016-08-24 MED ORDER — HYDROMORPHONE HCL 2 MG/ML IJ SOLN
1.0000 mg | Freq: Once | INTRAMUSCULAR | Status: AC
Start: 2016-08-24 — End: 2016-08-24
  Administered 2016-08-24: 1 mg via INTRAVENOUS
  Filled 2016-08-24: qty 1

## 2016-08-24 MED ORDER — POLYETHYLENE GLYCOL 3350 17 G PO PACK: 17.0000 g | PACK | Freq: Every day | ORAL | Status: DC | PRN

## 2016-08-24 MED ORDER — SODIUM CHLORIDE 0.45 % IV SOLN
INTRAVENOUS | Status: DC
  Administered 2016-08-24 – 2016-08-25 (×3): via INTRAVENOUS

## 2016-08-24 MED ORDER — VITAMIN D (ERGOCALCIFEROL) 1.25 MG (50000 UNIT) PO CAPS
50000.0000 [IU] | ORAL_CAPSULE | ORAL | Status: DC
  Administered 2016-08-24: 50000 [IU] via ORAL
  Filled 2016-08-24: qty 1

## 2016-08-24 MED ORDER — SENNOSIDES-DOCUSATE SODIUM 8.6-50 MG PO TABS
1.0000 | ORAL_TABLET | Freq: Two times a day (BID) | ORAL | Status: DC
  Administered 2016-08-24 – 2016-08-29 (×8): 1 via ORAL
  Filled 2016-08-24 (×9): qty 1

## 2016-08-24 MED ORDER — LIDOCAINE HCL (PF) 1 % IJ SOLN
5.0000 mL | Freq: Once | INTRAMUSCULAR | Status: AC
  Administered 2016-08-24: 5 mL via INTRADERMAL
  Filled 2016-08-24: qty 30

## 2016-08-24 MED ORDER — SODIUM CHLORIDE 0.9 % IV BOLUS (SEPSIS)
1000.0000 mL | Freq: Once | INTRAVENOUS | Status: AC
  Administered 2016-08-24: 1000 mL via INTRAVENOUS

## 2016-08-24 MED ORDER — NALOXONE HCL 0.4 MG/ML IJ SOLN: 0.4000 mg | INTRAMUSCULAR | Status: DC | PRN

## 2016-08-24 MED ORDER — SODIUM CHLORIDE 0.9% FLUSH: 9.0000 mL | INTRAVENOUS | Status: DC | PRN

## 2016-08-24 NOTE — Progress Notes (Signed)
*  PRELIMINARY RESULTS* Vascular Ultrasound Right lower extremity venous duplex has been completed.  Preliminary findings: No evidence of deep vein thrombosis or baker's cysts in the right lower extremity.   Chauncey Fischer 08/24/2016, 3:12 PM

## 2016-08-24 NOTE — ED Notes (Signed)
RN starting line and collecting blood work 

## 2016-08-24 NOTE — H&P (Signed)
Hospital Admission Note Date: 08/24/2016  Patient name: Nicolas Rogers Medical record number: [id number] Date of birth: Feb 18, 1996 Age: 21 y.o. Gender: male PCP: Massie Maroon, FNP  Attending physician: Altha Harm, MD  Chief Complaint: Pain in back and legs x 3 days, Priapism x 1 day  History of Present Illness: This is an opiate naive patient with Hb SS who presents to the ED with 2 days of pain characteristic of Sickle Cell Crisis. Pt was seen in the [facility] yesterday and treated with Dilaudid PCA. Pt reports that the treatment afforded minimal relief of pain and that he was given a prescription for Percocet which he took several times last night with minimal relief. Currently he rates the pain as 6-7/10 as compared to 0/10 ona  Usual basis. He also had an episode of Priapism which lasted about 2 hours and resolved spontaneously. Pt states that he experiences Priapism about 3-4 x month and he usually treats it with heat and exercise.   Pt denies any SOB, Cough, Focal weakness, headache, fever, chills, nausea, vomiting or diarrhea.   In the ED he had a vascular U/S performed due to pain in the right leg and it was negative for DVT. He received 3 doses of Dilaudid and his pain is still at 7/10. I am asked to admit him for Sickle Cell Crisis.   Scheduled Meds: Continuous Infusions: . sodium chloride 100 mL/hr at 08/24/16 1346   PRN Meds:. Allergies: Patient has no known allergies. Past Medical History:  Diagnosis Date  . Cholecystolithiasis   . Sickle cell anemia (HCC)    Past Surgical History:  Procedure Laterality Date  . ADENOIDECTOMY    . TONSILLECTOMY     No family history on file. Social History   Social History  . Marital status: Single    Spouse name: N/A  . Number of children: N/A  . Years of education: N/A   Occupational History  . Not on file.   Social History Main Topics  . Smoking status: Never Smoker  . Smokeless tobacco: Never Used  .  Alcohol use No  . Drug use: No  . Sexual activity: Not on file   Other Topics Concern  . Not on file   Social History Narrative  . No narrative on file   Review of Systems: Pertinent items noted in HPI and remainder of comprehensive ROS otherwise negative. Physical Exam:  Intake/Output Summary (Last 24 hours) at 08/24/16 1630 Last data filed at 08/24/16 1341  Gross per 24 hour  Intake             1000 ml  Output                0 ml  Net             1000 ml   General: Alert, awake, oriented x3, in mild distress.  HEENT: [place]/AT PEERL, EOMI, mild icterus Neck: Trachea midline,  no masses, no thyromegal,y no JVD, no carotid bruit OROPHARYNX:  Moist, No exudate/ erythema/lesions.  Heart: Regular rate and rhythm, without murmurs, rubs, gallops, PMI non-displaced, no heaves or thrills on palpation.  Lungs: Clear to auscultation, no wheezing or rhonchi noted. No increased vocal fremitus resonant to percussion  Abdomen: Soft, nontender, nondistended, positive bowel sounds, no masses no hepatosplenomegaly noted.  Neuro: No focal neurological deficits noted cranial nerves II through XII grossly intact.  Strength functional in bilateral upper and lower extremities. Musculoskeletal: No warm swelling or erythema around joints, no  spinal tenderness noted. Psychiatric: Patient alert and oriented x3, good insight and cognition, good recent to remote recall. Lymph node survey: No cervical axillary or inguinal lymphadenopathy noted.  Lab results:  Recent Labs  08/23/16 0623 08/24/16 1145  NA 134* 135  K 3.9 4.6  CL 103 102  CO2 24 25  GLUCOSE 101* 88  BUN <5* <5*  CREATININE 0.69 0.65  CALCIUM 8.3* 8.4*    Recent Labs  08/23/16 0623 08/24/16 1145  AST 57* 68*  ALT 24 28  ALKPHOS 56 66  BILITOT 4.7* 4.4*  PROT 7.6 7.9  ALBUMIN 3.8 4.1   No results for input(s): LIPASE, AMYLASE in the last 72 hours.  Recent Labs  08/23/16 0845 08/24/16 1145  WBC 14.6* 15.3*  NEUTROABS  9.9* 8.8*  HGB 7.2* 6.9*  HCT 20.3* 19.8*  MCV 79.3 76.2*  PLT 349 355   No results for input(s): CKTOTAL, CKMB, CKMBINDEX, TROPONINI in the last 72 hours. Invalid input(s): POCBNP No results for input(s): DDIMER in the last 72 hours. No results for input(s): HGBA1C in the last 72 hours. No results for input(s): CHOL, HDL, LDLCALC, TRIG, CHOLHDL, LDLDIRECT in the last 72 hours. No results for input(s): TSH, T4TOTAL, T3FREE, THYROIDAB in the last 72 hours.  Invalid input(s): FREET3  Recent Labs  08/23/16 0845 08/24/16 1145  RETICCTPCT 14.4* 19.5*   Imaging results:  Dg Chest 2 View  Result Date: 08/24/2016 CLINICAL DATA:  Mid chest pain and weakness today. Sickle cell crisis. EXAM: CHEST  2 VIEW COMPARISON:  08/22/2016 FINDINGS: The heart is mildly enlarged but stable. Very low lung volumes with vascular crowding and streaky atelectasis. Mild vascular congestion without overt pulmonary edema. No pleural effusions. The bony thorax is intact. IMPRESSION: Low lung volumes with vascular crowding and atelectasis. Vascular congestion without overt pulmonary edema. Mild cardiac enlargement, stable. Electronically Signed   By: Rudie Meyer M.D.   On: 08/24/2016 16:21   Dg Chest 2 View  Result Date: 08/22/2016 CLINICAL DATA:  Sickle cell crisis beginning today, chest pain. EXAM: CHEST  2 VIEW COMPARISON:  Chest radiograph November 19, 2013 FINDINGS: Cardiac silhouette is top-normal in size, mediastinal silhouette is unremarkable. Mild bronchitic changes. No pleural effusions or focal consolidations. Trachea projects midline and there is no pneumothorax. Soft tissue planes and included osseous structures are non-suspicious. Surgical clips in the included right abdomen compatible with cholecystectomy. IMPRESSION: Borderline cardiomegaly. Mild bronchitic changes without focal consolidation. Electronically Signed   By: Awilda Metro M.D.   On: 08/22/2016 17:14    Assessment and Plan: 1. Hb SS with  crisis: Start Morphine PCA, Toradol and IVF. 2. Priapism: Resolved without medical intervention 3. Anemia of Chronic Disease: Based on a review of his labs ne appears to be at baseline.  4. Leukocytosis: Likely related to crisis. No evidence of infection.   Modesto Ganoe A. 08/24/2016, 4:30 PM  Time spent during this visit was 50 minutes. Greater than 50% of time spent in  Face to face contact, counseling and coordination.  Nicky Kras A.  Pager [phone]. If 7PM-7AM, please contact night-coverage.

## 2016-08-24 NOTE — ED Notes (Signed)
He is awake, alert and in no distress. He is placed on O2 at this time, which I rapidly titrate up to 4 l.p.m. Also, the sonographer is here to perform extremity doppler test.

## 2016-08-24 NOTE — ED Notes (Signed)
I have just given report to [place], RN on [id number]; and will transport shortly.

## 2016-08-24 NOTE — ED Provider Notes (Signed)
[facility]-EMERGENCY DEPT Provider Note   CSN: [id number] Arrival date & time: 08/24/16  0941     History   Chief Complaint Chief Complaint  Patient presents with  . priapism  . Sickle Cell Pain Crisis    HPI Nicolas Rogers is a 21 y.o. male.  HPI   Patient is a 21 year old male with sickle cell anemia presenting today with sickle cell crisis and priapism. Patient was seen at the sickle cell clinic yesterday and then again at today for his traditional pain. However today since he had a priapism he was sent here to emergency department. Patient has had a priapism since last 11 PM. Patient's never had to have it reduced in the department before.  Past Medical History:  Diagnosis Date  . Cholecystolithiasis   . Sickle cell anemia [facility])     Patient Active Problem List   Diagnosis Date Noted  . Hb-SS disease with crisis (HCC) 08/23/2016  . Vitamin D deficiency 03/12/2014  . Need for immunization against influenza 03/12/2014  . 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 ADENOIDECTOMY    . TONSILLECTOMY         Home Medications    Prior to Admission medications   Medication Sig Start Date End Date Taking? Authorizing Provider  folic acid (FOLVITE) 1 MG tablet Take 1 tablet (1 mg total) by mouth every morning. 04/29/16  Yes Massie Maroon, FNP  hydroxyurea (HYDREA) 500 MG capsule Take 2 capsules (1,000 mg total) by mouth every morning. May take with food to minimize GI side effects. 04/29/16  Yes Massie Maroon, FNP  ibuprofen (ADVIL,MOTRIN) 600 MG tablet Take 1 tablet (600 mg total) by mouth every 8 (eight) hours as needed. 08/23/16  Yes Massie Maroon, FNP  oxyCODONE-acetaminophen (PERCOCET/ROXICET) 5-325 MG tablet Take 1 tablet by mouth every 6 (six) hours as needed for severe pain. 08/23/16  Yes Massie Maroon, FNP  Vitamin D, Ergocalciferol, (DRISDOL) 50000 units CAPS capsule Take 50,000 Units by mouth every  Wednesday.   Yes Historical Provider, MD    Family History No family history on file.  Social History Social History  Substance Use Topics  . Smoking status: Never Smoker  . Smokeless tobacco: Never Used  . Alcohol use No     Allergies   Patient has no known allergies.   Review of Systems Review of Systems  Constitutional: Negative for activity change.  Respiratory: Negative for shortness of breath.   Cardiovascular: Negative for chest pain.  Gastrointestinal: Negative for abdominal pain.     Physical Exam Updated Vital Signs BP 112/72   Pulse 81   Temp 98 F (36.7 C) (Oral)   Resp 12   SpO2 98%   Physical Exam  Constitutional: He is oriented to person, place, and time. He appears well-nourished.  HENT:  Head: Normocephalic.  Mild icterus  Eyes: Conjunctivae are normal.  Cardiovascular: Normal rate.   Pulmonary/Chest: Effort normal and breath sounds normal. No respiratory distress.  Abdominal: Soft. Bowel sounds are normal. He exhibits no distension. There is no tenderness.  Genitourinary:  Genitourinary Comments: pirapism  Neurological: He is oriented to person, place, and time.  Skin: Skin is warm and dry. He is not diaphoretic.  Psychiatric: He has a normal mood and affect. His behavior is normal.     ED Treatments / Results  Labs (all labs ordered are listed, but only abnormal results are displayed) Labs Reviewed  CBC WITH DIFFERENTIAL/PLATELET - Abnormal; Notable for the following:       Result Value   WBC 15.3 (*)    RBC 2.60 (*)    Hemoglobin 6.9 (*)    HCT 19.8 (*)    MCV 76.2 (*)    RDW 29.2 (*)    Neutro Abs 8.8 (*)    Monocytes Absolute 2.6 (*)    Basophils Absolute 0.2 (*)    All other components within normal limits  COMPREHENSIVE METABOLIC PANEL - Abnormal; Notable for the following:    BUN <5 (*)    Calcium 8.4 (*)    AST 68 (*)    Total Bilirubin 4.4 (*)    All other components within normal limits  RETICULOCYTES - Abnormal;  Notable for the following:    Retic Ct Pct 19.5 (*)    RBC. 2.60 (*)    Retic Count, Manual 507.0 (*)    All other components within normal limits  TYPE AND SCREEN    EKG  EKG Interpretation None       Radiology Dg Chest 2 View  Result Date: 08/22/2016 CLINICAL DATA:  Sickle cell crisis beginning today, chest pain. EXAM: CHEST  2 VIEW COMPARISON:  Chest radiograph November 19, 2013 FINDINGS: Cardiac silhouette is top-normal in size, mediastinal silhouette is unremarkable. Mild bronchitic changes. No pleural effusions or focal consolidations. Trachea projects midline and there is no pneumothorax. Soft tissue planes and included osseous structures are non-suspicious. Surgical clips in the included right abdomen compatible with cholecystectomy. IMPRESSION: Borderline cardiomegaly. Mild bronchitic changes without focal consolidation. Electronically Signed   By: Awilda Metro M.D.   On: 08/22/2016 17:14    Procedures Procedures (including critical care time)  Medications Ordered in ED Medications  0.45 % sodium chloride infusion ( Intravenous New Bag/Given 08/24/16 1346)  phenylephrine 200 mcg / ml CONC. DILUTION INJ (ED / Urology USE ONLY) (50 mcg Intracavernosal Given by Other 08/24/16 1158)  lidocaine (PF) (XYLOCAINE) 1 % injection 5 mL (5 mLs Intradermal Given by Other 08/24/16 1158)  sodium chloride 0.9 % bolus 1,000 mL (0 mLs Intravenous Stopped 08/24/16 1340)  HYDROmorphone (DILAUDID) injection 1 mg (1 mg Intravenous Given 08/24/16 1250)  ondansetron (ZOFRAN) injection 4 mg (4 mg Intravenous Given 08/24/16 1250)  HYDROmorphone (DILAUDID) injection 0.5-1 mg (1 mg Intravenous Given 08/24/16 1346)    Or  HYDROmorphone (DILAUDID) injection 0.5-1 mg ( Subcutaneous See Alternative 08/24/16 1346)  HYDROmorphone (DILAUDID) injection 0.5-1 mg (1 mg Intravenous Given 08/24/16 1448)    Or  HYDROmorphone (DILAUDID) injection 0.5-1 mg ( Subcutaneous See Alternative 08/24/16 1448)     Initial  Impression / Assessment and Plan / ED Course  I have reviewed the triage vital signs and the nursing notes.  Pertinent labs & imaging results that were available during my care of the patient were reviewed by me and considered in my medical decision making (see chart for details).    Patient's 21 year old male with sickle cell disease presenting with priapism. We'll treat sickle cell crisis as well as treating the priapism.Pt has pain in legs and back for the last several days.  Seen multiple times here and at [facility] clinic in the last three days. No CP. Pt on hydroxyurea, usually only uses percocet for pain control.   3:43 PM Pts priapsim resolving    3:43 PM Priapism completely resolved. Patient on his second dose of pain medication. Still having pain. We'll give the third dose now. Patient also complains that he has  some right lower extremity swelling and concerned about DVT. Will order the ultrasound study.  3:43 PM [place] negative.  Pt still has pain. Will admit for sickle cell crisis.   Final Clinical Impressions(s) / ED Diagnoses   Final diagnoses:  None    New Prescriptions New Prescriptions   No medications on file     Caison Hearn Randall An, MD 08/24/16 [phone]

## 2016-08-24 NOTE — Telephone Encounter (Signed)
Patient walked into [facility] C/O pain to legs and back that he rates 7/10.  Patient also C/O priapism that has lasted for 8hours.  I explained to the patient that importance of calling to be triaged prior to coming to [facility]. Provider notified, and advises patient to go to ED.  Explained that priapism is an urgent issue and he needs to be seen in the emergency department for it.  Patient understood.  Patient taken to ED for check in via wheelchair.

## 2016-08-24 NOTE — ED Triage Notes (Signed)
Patient c/o sickle cell pain. Patient states that he went to [facility] clinic today for his continued back and arm/leg pain but was told to come to ED since patient having priapism since last night.

## 2016-08-25 DIAGNOSIS — R0902 Hypoxemia: Secondary | ICD-10-CM

## 2016-08-25 DIAGNOSIS — D72829 Elevated white blood cell count, unspecified: Secondary | ICD-10-CM

## 2016-08-25 LAB — CBC WITH DIFFERENTIAL/PLATELET
BASOS PCT: 0 %
Basophils Absolute: 0 10*3/uL (ref 0.0–0.1)
Basophils Absolute: 0 10*3/uL (ref 0.0–0.1)
Basophils Relative: 0 %
EOS ABS: 0 10*3/uL (ref 0.0–0.7)
EOS ABS: 0.2 10*3/uL (ref 0.0–0.7)
Eosinophils Relative: 0 %
Eosinophils Relative: 1 %
HCT: 16.7 % — ABNORMAL LOW (ref 39.0–52.0)
HEMATOCRIT: 17 % — AB (ref 39.0–52.0)
Hemoglobin: 5.9 g/dL — CL (ref 13.0–17.0)
Hemoglobin: 6 g/dL — CL (ref 13.0–17.0)
Lymphocytes Relative: 32 %
Lymphocytes Relative: 35 %
Lymphs Abs: 5.1 10*3/uL — ABNORMAL HIGH (ref 0.7–4.0)
Lymphs Abs: 5.6 10*3/uL — ABNORMAL HIGH (ref 0.7–4.0)
MCH: 26.8 pg (ref 26.0–34.0)
MCH: 27.3 pg (ref 26.0–34.0)
MCHC: 35.3 g/dL (ref 30.0–36.0)
MCHC: 35.3 g/dL (ref 30.0–36.0)
MCV: 75.9 fL — ABNORMAL LOW (ref 78.0–100.0)
MCV: 77.3 fL — AB (ref 78.0–100.0)
MONO ABS: 2.2 10*3/uL — AB (ref 0.1–1.0)
MONO ABS: 1.7 10*3/uL — AB (ref 0.1–1.0)
MONOS PCT: 11 %
Monocytes Relative: 14 %
NEUTROS ABS: 8.4 10*3/uL — AB (ref 1.7–7.7)
NRBC: 8 /100{WBCs} — AB
Neutro Abs: 8.6 10*3/uL — ABNORMAL HIGH (ref 1.7–7.7)
Neutrophils Relative %: 54 %
Neutrophils Relative %: 53 %
PLATELETS: 280 10*3/uL (ref 150–400)
PLATELETS: 250 10*3/uL (ref 150–400)
RBC: 2.2 MIL/uL — ABNORMAL LOW (ref 4.22–5.81)
RBC: 2.2 MIL/uL — AB (ref 4.22–5.81)
RDW: 29.1 % — AB (ref 11.5–15.5)
RDW: 29.7 % — AB (ref 11.5–15.5)
WBC: 15.9 10*3/uL — AB (ref 4.0–10.5)
WBC: 15.9 10*3/uL — AB (ref 4.0–10.5)

## 2016-08-25 LAB — URINALYSIS, ROUTINE W REFLEX MICROSCOPIC
BACTERIA UA: NONE SEEN
Bilirubin Urine: NEGATIVE
GLUCOSE, UA: NEGATIVE mg/dL
KETONES UR: NEGATIVE mg/dL
Leukocytes, UA: NEGATIVE
Nitrite: NEGATIVE
PROTEIN: NEGATIVE mg/dL
Specific Gravity, Urine: 1.01 (ref 1.005–1.030)
Squamous Epithelial / LPF: NONE SEEN
pH: 6 (ref 5.0–8.0)

## 2016-08-25 LAB — LACTATE DEHYDROGENASE: LDH: 684 U/L — ABNORMAL HIGH (ref 98–192)

## 2016-08-25 NOTE — Progress Notes (Signed)
CRITICAL VALUE ALERT  Critical value received:  hgb 5.9  Date of notification:  N/A  Time of notification:  N/A  Critical value read back:  Nurse who received alert:  J.Sona Nations  MD notified (1st page):  K.Schorr  Time of first page:  0040  MD notified (2nd page):  Time of second page:  Responding MD:  N/A  Time MD responded:  N/A

## 2016-08-25 NOTE — Progress Notes (Signed)
SICKLE CELL SERVICE PROGRESS NOTE  Nicolas Rogers WUJ:[id number] DOB: 1995-05-27 DOA: 08/24/2016 PCP: Massie Maroon, FNP  Assessment/Plan: Active Problems:   Hb-SS disease with crisis (HCC)  1. Hb SS with crisis: Continue at current dose and encourage increased usage. Continue Toradol and decrease IVF to [facility].  2. Leukocytosis: WBC elevated but no evidence of infection. Likely related to crisis.  3. Anemia of chronic disease: Hb decreased with elevated LDH. I will T&C as he appears to be having increased hemolysis. However he is hemodynamically stable at this time. He is mildly hypoxic but I feel that this is likely due to atelectasis. Hydrea on hold due to Hb <6 g/dL.    Code Status: Full Code Family Communication: N/A Disposition Plan: Not yet ready for discharge  Nicolas Rogers A.  Pager [phone]. If 7PM-7AM, please contact night-coverage.  08/25/2016, 3:51 PM  LOS: 1 day   Interim History: Pt reports pain in LLE and chest wall at an intensity of 7/10. He has used 27.4 mg of Morphine on the PCA with 19/19:demands/deliveries in the last 24 hours. He denies any cough, fevers, dizziness, vomiting or diarrhea.   Consultants:  None  Procedures:  None  Antibiotics:  None    Objective: Vitals:   08/25/16 0743 08/25/16 1104 08/25/16 1245 08/25/16 1352  BP:  (!) 94/52  105/63  Pulse:  86  84  Resp: Temp:  98.8 F (37.1 C)  98.8 F (37.1 C)  TempSrc:  Oral  Oral  SpO2: 95% 93% 93% 93%  Weight:      Height:       Weight change:   Intake/Output Summary (Last 24 hours) at 08/25/16 1551 Last data filed at 08/25/16 1105  Gross per 24 hour  Intake             2385 ml  Output             1100 ml  Net             1285 ml    General: Alert, awake, oriented x3, in no acute distress.  HEENT: [place]/AT PEERL, EOMI, mild icterus Neck: Trachea midline,  no masses, no thyromegal,y no JVD, no carotid bruit OROPHARYNX:  Moist, No exudate/ erythema/lesions.   Heart: Regular rate and rhythm, without murmurs, rubs, gallops, PMI non-displaced, no heaves or thrills on palpation.  Lungs: Clear to auscultation, no wheezing or rhonchi noted. No increased vocal fremitus resonant to percussion  Abdomen: Soft, nontender, nondistended, positive bowel sounds, no masses no hepatosplenomegaly noted.  Neuro: No focal neurological deficits noted cranial nerves II through XII grossly intact.  Strength at baseline in bilateral upper and lower extremities. Musculoskeletal: No warmth swelling or erythema around joints, no spinal tenderness noted. Psychiatric: Patient alert and oriented x3, good insight and cognition, good recent to remote recall.    Data Reviewed: Basic Metabolic Panel:  Recent Labs Lab 08/22/16 1659 08/23/16 0623 08/24/16 1145  NA 139 134* 135  K 4.1 3.9 4.6  CL 109 103 102  CO2 GLUCOSE 123* 101* 88  BUN <5* <5* <5*  CREATININE 0.74 0.69 0.65  CALCIUM 8.5* 8.3* 8.4*   Liver Function Tests:  Recent Labs Lab 08/22/16 1659 08/23/16 0623 08/24/16 1145  AST 45* 57* 68*  ALT 15* 24 28  ALKPHOS 55 56 66  BILITOT 3.5* 4.7* 4.4*  PROT 8.0 7.6 7.9  ALBUMIN 4.0 3.8 4.1   No results for input(s): LIPASE, AMYLASE in  the last 168 hours. No results for input(s): AMMONIA in the last 168 hours. CBC:  Recent Labs Lab 08/22/16 1659 08/23/16 0845 08/24/16 1145 08/24/16 2328 08/25/16 1107  WBC 18.8* 14.6* 15.3* 15.9* 15.9*  NEUTROABS 12.6* 9.9* 8.8* 8.6* 8.4*  HGB 7.5* 7.2* 6.9* 5.9* 6.0*  HCT 22.0* 20.3* 19.8* 16.7* 17.0*  MCV 79.1 79.3 76.2* 75.9* 77.3*  PLT 380 349 355 280 250   Cardiac Enzymes: No results for input(s): CKTOTAL, CKMB, CKMBINDEX, TROPONINI in the last 168 hours. BNP (last 3 results) No results for input(s): BNP in the last 8760 hours.  ProBNP (last 3 results) No results for input(s): PROBNP in the last 8760 hours.  CBG: No results for input(s): GLUCAP in the last 168 hours.  No results found for  this or any previous visit (from the past 240 hour(s)).   Studies: Dg Chest 2 View  Result Date: 08/24/2016 CLINICAL DATA:  Mid chest pain and weakness today. Sickle cell crisis. EXAM: CHEST  2 VIEW COMPARISON:  08/22/2016 FINDINGS: The heart is mildly enlarged but stable. Very low lung volumes with vascular crowding and streaky atelectasis. Mild vascular congestion without overt pulmonary edema. No pleural effusions. The bony thorax is intact. IMPRESSION: Low lung volumes with vascular crowding and atelectasis. Vascular congestion without overt pulmonary edema. Mild cardiac enlargement, stable. Electronically Signed   By: Rudie Meyer M.D.   On: 08/24/2016 16:21   Dg Chest 2 View  Result Date: 08/22/2016 CLINICAL DATA:  Sickle cell crisis beginning today, chest pain. EXAM: CHEST  2 VIEW COMPARISON:  Chest radiograph November 19, 2013 FINDINGS: Cardiac silhouette is top-normal in size, mediastinal silhouette is unremarkable. Mild bronchitic changes. No pleural effusions or focal consolidations. Trachea projects midline and there is no pneumothorax. Soft tissue planes and included osseous structures are non-suspicious. Surgical clips in the included right abdomen compatible with cholecystectomy. IMPRESSION: Borderline cardiomegaly. Mild bronchitic changes without focal consolidation. Electronically Signed   By: Awilda Metro M.D.   On: 08/22/2016 17:14   Dg Chest Port 1 View  Result Date: 08/24/2016 CLINICAL DATA:  Sickle cell crisis.  Fever starting about our ago. EXAM: PORTABLE CHEST 1 VIEW COMPARISON:  08/24/2016 FINDINGS: Cardiac enlargement without vascular congestion. No edema or consolidation. No blunting of costophrenic angles. No pneumothorax. Mediastinal contours appear intact. IMPRESSION: Cardiac enlargement.  No evidence of active pulmonary disease. Electronically Signed   By: Burman Nieves M.D.   On: 08/24/2016 23:53    Scheduled Meds: . diphenhydrAMINE  25 mg Oral Q6H  . enoxaparin  (LOVENOX) injection  40 mg Subcutaneous Q24H  . folic acid  1 mg Oral q morning - 10a  . ketorolac  15 mg Intravenous Q6H  . morphine   Intravenous Q4H  . senna-docusate  1 tablet Oral BID  . Vitamin D (Ergocalciferol)  50,000 Units Oral Q Wed   Continuous Infusions: . sodium chloride 10 mL/hr at 08/25/16 1138  . dextrose 5 % and 0.45% NaCl 100 mL/hr at 08/25/16 0851  . diphenhydrAMINE (BENADRYL) IVPB(SICKLE CELL ONLY)      Active Problems:   Hb-SS disease with crisis (HCC)   In excess of 25 minutes spent during this visit. Greater than 50% involved face to face contact with the patient for assessment, counseling and coordination of care.

## 2016-08-25 NOTE — Progress Notes (Signed)
PHARMACY BRIEF NOTE: HYDROXYUREA   By [facility] policy, hydroxyurea is automatically held when any of the following laboratory values occur:  ANC < 2 K  Pltc < 80K in sickle-cell patients; < 100K in other patients  Hgb <= 6 in sickle-cell patients; < 8 in other patients  Reticulocytes < 80K when Hgb < 9  Hydroxyurea has been held (discontinued from profile) per policy due to Hgb of 5.9   Hessie Knows, PharmD, BCPS Pager [phone] 08/25/2016 10:01 AM

## 2016-08-26 DIAGNOSIS — R509 Fever, unspecified: Secondary | ICD-10-CM

## 2016-08-26 LAB — LACTATE DEHYDROGENASE: LDH: 689 U/L — ABNORMAL HIGH (ref 98–192)

## 2016-08-26 LAB — RETICULOCYTES
RBC.: 2.16 MIL/uL — ABNORMAL LOW (ref 4.22–5.81)
RETIC COUNT ABSOLUTE: 360.7 10*3/uL — AB (ref 19.0–186.0)
Retic Ct Pct: 16.7 % — ABNORMAL HIGH (ref 0.4–3.1)

## 2016-08-26 LAB — CBC WITH DIFFERENTIAL/PLATELET
BASOS PCT: 0 %
Basophils Absolute: 0 10*3/uL (ref 0.0–0.1)
EOS PCT: 0 %
Eosinophils Absolute: 0 10*3/uL (ref 0.0–0.7)
HEMATOCRIT: 17.3 % — AB (ref 39.0–52.0)
HEMOGLOBIN: 5.9 g/dL — AB (ref 13.0–17.0)
LYMPHS PCT: 15 %
Lymphs Abs: 3.7 10*3/uL (ref 0.7–4.0)
MCH: 27.3 pg (ref 26.0–34.0)
MCHC: 34.1 g/dL (ref 30.0–36.0)
MCV: 80.1 fL (ref 78.0–100.0)
MONOS PCT: 8 %
Monocytes Absolute: 2 10*3/uL — ABNORMAL HIGH (ref 0.1–1.0)
NEUTROS PCT: 77 %
Neutro Abs: 19.2 10*3/uL — ABNORMAL HIGH (ref 1.7–7.7)
PLATELETS: 269 10*3/uL (ref 150–400)
RBC: 2.16 MIL/uL — AB (ref 4.22–5.81)
RDW: 31.7 % — ABNORMAL HIGH (ref 11.5–15.5)
WBC: 24.9 10*3/uL — AB (ref 4.0–10.5)

## 2016-08-26 MED ORDER — ACETAMINOPHEN 325 MG PO TABS
325.0000 mg | ORAL_TABLET | Freq: Once | ORAL | Status: AC
  Administered 2016-08-26: 325 mg via ORAL
  Filled 2016-08-26: qty 1

## 2016-08-26 MED ORDER — SODIUM CHLORIDE 0.9 % IV BOLUS (SEPSIS)
500.0000 mL | Freq: Once | INTRAVENOUS | Status: AC
  Administered 2016-08-26: 500 mL via INTRAVENOUS

## 2016-08-26 NOTE — Progress Notes (Signed)
Pt T 100.1; paged Dr to notify. Encouraged increased use of I/S. Pt complied. No new orders; will continue to monitor.

## 2016-08-27 DIAGNOSIS — D582 Other hemoglobinopathies: Secondary | ICD-10-CM

## 2016-08-27 LAB — CBC WITH DIFFERENTIAL/PLATELET
Basophils Absolute: 0 10*3/uL (ref 0.0–0.1)
Basophils Relative: 0 %
EOS PCT: 2 %
Eosinophils Absolute: 0.5 10*3/uL (ref 0.0–0.7)
HEMATOCRIT: 17.3 % — AB (ref 39.0–52.0)
HEMOGLOBIN: 5.9 g/dL — AB (ref 13.0–17.0)
LYMPHS ABS: 5.4 10*3/uL — AB (ref 0.7–4.0)
Lymphocytes Relative: 22 %
MCH: 28.1 pg (ref 26.0–34.0)
MCHC: 34.1 g/dL (ref 30.0–36.0)
MCV: 82.4 fL (ref 78.0–100.0)
Monocytes Absolute: 2 10*3/uL — ABNORMAL HIGH (ref 0.1–1.0)
Monocytes Relative: 8 %
NEUTROS ABS: 16.6 10*3/uL — AB (ref 1.7–7.7)
Neutrophils Relative %: 68 %
Platelets: 291 10*3/uL (ref 150–400)
RBC: 2.1 MIL/uL — AB (ref 4.22–5.81)
RDW: 31.8 % — ABNORMAL HIGH (ref 11.5–15.5)
WBC: 24.5 10*3/uL — ABNORMAL HIGH (ref 4.0–10.5)

## 2016-08-27 LAB — PREPARE RBC (CROSSMATCH)

## 2016-08-27 LAB — RETICULOCYTES
RBC.: 2.1 MIL/uL — AB (ref 4.22–5.81)
Retic Count, Absolute: 352.8 10*3/uL — ABNORMAL HIGH (ref 19.0–186.0)
Retic Ct Pct: 16.8 % — ABNORMAL HIGH (ref 0.4–3.1)

## 2016-08-27 LAB — LACTATE DEHYDROGENASE: LDH: 664 U/L — AB (ref 98–192)

## 2016-08-27 MED ORDER — SODIUM CHLORIDE 0.9 % IV SOLN
Freq: Once | INTRAVENOUS | Status: AC
  Administered 2016-08-27: 17:00:00 via INTRAVENOUS

## 2016-08-27 NOTE — Progress Notes (Signed)
SICKLE CELL SERVICE PROGRESS NOTE  Nicolas Rogers UJW:[id number] DOB: 14-Sep-1995 DOA: 08/24/2016 PCP: Massie Maroon, FNP  Assessment/Plan: Active Problems:   Hb-SS disease with crisis (HCC)  1. Fever: Pt had elevated temperature last night without any overt signs of infection. This could be a reflection of the sickle cell crisis but could also signal an infection. Pt remains clinically stable and non-toxic appearing so will continue to observe without antibiotics. Tylenol for fever.  2. Hb SS with crisis: Continue Toradol and Morphine PCA at current doses.  3. Leukocytosis: May be related to crisis or could be an indication of emerging an infection.  4. Anemia of chronic disease: Pt has had a decrease in Hb. LDH still elevated however  Baseline LDH undocumented in our medical records and am unsure if this is chronic or acute hemolysis. Will re-assess tomorrow. If further decrease will transfuse 1 unit RBC's. Pt still hemodynamically stable.   Code Status: Full Code Family Communication: Pt's mother at bedside and updated on his condition.  Disposition Plan: Not yet ready for discharge  Eboni Coval A.  Pager [phone]. If 7PM-7AM, please contact night-coverage.  08/26/2016, 10:09 AM  LOS: 2 days   Interim History: Pt reports pain now only in chest wall and back at an intensity of 4-5/10. He has been using IS only minimally. Encouraged increased use. Also encouraged ambulation.   Consultants:  None  Procedures:  None  Antibiotics:  None   Objective: General: Alert, awake, oriented x3, in no acute distress.  HEENT: [place]/AT PEERL, EOMI, mild hemolysis Neck: Trachea midline, no masses, no thyromegal,y no JVD, no carotid bruit OROPHARYNX:  Moist, No exudate/ erythema/lesions.  Heart: Regular rate and rhythm, without murmurs, rubs, gallops, PMI non-displaced, no heaves or thrills on palpation.  Lungs: Clear to auscultation with very good air entry after ICS use, no wheezing  or rhonchi noted. No increased vocal fremitus resonant to percussion  Abdomen: Soft, nontender, nondistended, positive bowel sounds, no masses no hepatosplenomegaly noted.  Neuro: No focal neurological deficits noted cranial nerves II through XII grossly intact.  Strength at baseline in bilateral upper and lower extremities. Musculoskeletal: No warmth swelling or erythema around joints, no spinal tenderness noted. Psychiatric: Patient alert and oriented x3, good insight and cognition, good recent to remote recall.    Data Reviewed: Basic Metabolic Panel:  Recent Labs Lab 08/22/16 1659 08/23/16 0623 08/24/16 1145  NA 139 134* 135  K 4.1 3.9 4.6  CL 109 103 102  CO2 GLUCOSE 123* 101* 88  BUN <5* <5* <5*  CREATININE 0.74 0.69 0.65  CALCIUM 8.5* 8.3* 8.4*   Liver Function Tests:  Recent Labs Lab 08/22/16 1659 08/23/16 0623 08/24/16 1145  AST 45* 57* 68*  ALT 15* 24 28  ALKPHOS 55 56 66  BILITOT 3.5* 4.7* 4.4*  PROT 8.0 7.6 7.9  ALBUMIN 4.0 3.8 4.1   No results for input(s): LIPASE, AMYLASE in the last 168 hours. No results for input(s): AMMONIA in the last 168 hours. CBC:  Recent Labs Lab 08/23/16 0845 08/24/16 1145 08/24/16 2328 08/25/16 1107 08/26/16 1148  WBC 14.6* 15.3* 15.9* 15.9* 24.9*  NEUTROABS 9.9* 8.8* 8.6* 8.4* 19.2*  HGB 7.2* 6.9* 5.9* 6.0* 5.9*  HCT 20.3* 19.8* 16.7* 17.0* 17.3*  MCV 79.3 76.2* 75.9* 77.3* 80.1  PLT 349 355 280 250 269   Cardiac Enzymes: No results for input(s): CKTOTAL, CKMB, CKMBINDEX, TROPONINI in the last 168 hours. BNP (last 3 results) No results for input(s): BNP  in the last 8760 hours.  ProBNP (last 3 results) No results for input(s): PROBNP in the last 8760 hours.  CBG: No results for input(s): GLUCAP in the last 168 hours.  No results found for this or any previous visit (from the past 240 hour(s)).   Studies: Dg Chest 2 View  Result Date: 08/24/2016 CLINICAL DATA:  Mid chest pain and weakness today.  Sickle cell crisis. EXAM: CHEST  2 VIEW COMPARISON:  08/22/2016 FINDINGS: The heart is mildly enlarged but stable. Very low lung volumes with vascular crowding and streaky atelectasis. Mild vascular congestion without overt pulmonary edema. No pleural effusions. The bony thorax is intact. IMPRESSION: Low lung volumes with vascular crowding and atelectasis. Vascular congestion without overt pulmonary edema. Mild cardiac enlargement, stable. Electronically Signed   By: Rudie Meyer M.D.   On: 08/24/2016 16:21   Dg Chest 2 View  Result Date: 08/22/2016 CLINICAL DATA:  Sickle cell crisis beginning today, chest pain. EXAM: CHEST  2 VIEW COMPARISON:  Chest radiograph November 19, 2013 FINDINGS: Cardiac silhouette is top-normal in size, mediastinal silhouette is unremarkable. Mild bronchitic changes. No pleural effusions or focal consolidations. Trachea projects midline and there is no pneumothorax. Soft tissue planes and included osseous structures are non-suspicious. Surgical clips in the included right abdomen compatible with cholecystectomy. IMPRESSION: Borderline cardiomegaly. Mild bronchitic changes without focal consolidation. Electronically Signed   By: Awilda Metro M.D.   On: 08/22/2016 17:14   Dg Chest Port 1 View  Result Date: 08/24/2016 CLINICAL DATA:  Sickle cell crisis.  Fever starting about our ago. EXAM: PORTABLE CHEST 1 VIEW COMPARISON:  08/24/2016 FINDINGS: Cardiac enlargement without vascular congestion. No edema or consolidation. No blunting of costophrenic angles. No pneumothorax. Mediastinal contours appear intact. IMPRESSION: Cardiac enlargement.  No evidence of active pulmonary disease. Electronically Signed   By: Burman Nieves M.D.   On: 08/24/2016 23:53    Scheduled Meds: . diphenhydrAMINE  25 mg Oral Q6H  . enoxaparin (LOVENOX) injection  40 mg Subcutaneous Q24H  . folic acid  1 mg Oral q morning - 10a  . ketorolac  15 mg Intravenous Q6H  . morphine   Intravenous Q4H  .  senna-docusate  1 tablet Oral BID  . Vitamin D (Ergocalciferol)  50,000 Units Oral Q Wed   Continuous Infusions: . dextrose 5 % and 0.45% NaCl 10 mL/hr at 08/26/16 0116  . diphenhydrAMINE (BENADRYL) IVPB(SICKLE CELL ONLY)      Active Problems:   Hb-SS disease with crisis (HCC)  In excess of 30 minutes spent during this visit. 50% involved face to face contact with the patient for assessment, counseling and coordination of care.

## 2016-08-27 NOTE — Progress Notes (Signed)
SICKLE CELL SERVICE PROGRESS NOTE  Nicolas Rogers UJW:[id number] DOB: [birth year] DOA: 08/24/2016 PCP: Massie Maroon, FNP  Assessment/Plan: Active Problems:   Hb-SS disease with crisis (HCC)  1. Fever: Pt has continued to have fever intermittently without any overt focus of infection. He continues to be hemodynamically sable. Will continue to observe without antibiotics. I believe that this is related to his crisis.  2. Hypoxia: Pt has no clinical sequela of pneumonia. Will transfuse 1 uni of blood as low Hb  is high on the differential for the hypoxia. If still hypoxic after transfusion, will obtain D-dimer  To evaluate for possible PE. 3. Hb SS with crisis: Continue oral analgesics, Toradol and PCA for PRN use.   4. Leukocytosis: Etiology unclear. He continues to have an elevated WBC without focus of infection.  5. Acute Hemolysis in setting of Anemia of Chronic Disease: Will transfuse 1 Unit RBC as he is symptomatic. Re-check Hb tomorrow.   Code Status: Full Code Family Communication: N/A Disposition Plan: Not yet ready for discharge  Humphrey Guerreiro A.  Pager [phone]. If 7PM-7AM, please contact night-coverage.  08/27/2016, 12:35 PM  LOS: 3 days   Interim History: Pain much improved. Pt reports pain in right collar bone and low back at intensity of 4-5/10. Pt continues to have minimal use on the PCA. I have again explained the importance of PCA use and encourages patient to use between each TV commercial.   Consultants:  None  Procedures:  None  Antibiotics:  None    Objective: Vitals:   08/27/16 0414 08/27/16 0815 08/27/16 1000 08/27/16 1217  BP:   120/71   Pulse:   79   Resp: Temp:   99.8 F (37.7 C)   TempSrc:   Oral   SpO2: 91% 95% 96% 99%  Weight:      Height:       Weight change: -2.2 kg (-4 lb 13.6 oz)  Intake/Output Summary (Last 24 hours) at 08/27/16 1235 Last data filed at 08/27/16 0620  Gross per 24 hour  Intake          1395.57  ml  Output             1100 ml  Net           295.57 ml    General: Alert, awake, oriented x3, in no acute distress.  HEENT: [place]/AT PEERL, EOMI, mild icterus Neck: Trachea midline,  no masses, no thyromegal,y no JVD, no carotid bruit OROPHARYNX:  Moist, No exudate/ erythema/lesions.  Heart: Regular rate and rhythm, without murmurs, rubs, gallops, PMI non-displaced, no heaves or thrills on palpation.  Lungs: Clear to auscultation. Air entry at bases improved over yesterday. No wheezing or rhonchi noted. No increased vocal fremitus resonant to percussion  Abdomen: Soft, nontender, nondistended, positive bowel sounds, no masses no hepatosplenomegaly noted.  Neuro: No focal neurological deficits noted cranial nerves II through XII grossly intact.  Strength functional in bilateral upper and lower extremities. Musculoskeletal: No warmth swelling or erythema around joints, no spinal tenderness noted. Psychiatric: Patient alert and oriented x3, good insight and cognition, good recent to remote recall.    Data Reviewed: Basic Metabolic Panel:  Recent Labs Lab 08/22/16 1659 08/23/16 0623 08/24/16 1145  NA 139 134* 135  K 4.1 3.9 4.6  CL 109 103 102  CO2 GLUCOSE 123* 101* 88  BUN <5* <5* <5*  CREATININE 0.74 0.69 0.65  CALCIUM 8.5* 8.3* 8.4*  Liver Function Tests:  Recent Labs Lab 08/22/16 1659 08/23/16 0623 08/24/16 1145  AST 45* 57* 68*  ALT 15* 24 28  ALKPHOS 55 56 66  BILITOT 3.5* 4.7* 4.4*  PROT 8.0 7.6 7.9  ALBUMIN 4.0 3.8 4.1   No results for input(s): LIPASE, AMYLASE in the last 168 hours. No results for input(s): AMMONIA in the last 168 hours. CBC:  Recent Labs Lab 08/24/16 1145 08/24/16 2328 08/25/16 1107 08/26/16 1148 08/27/16 1021  WBC 15.3* 15.9* 15.9* 24.9* 24.5*  NEUTROABS 8.8* 8.6* 8.4* 19.2* 16.6*  HGB 6.9* 5.9* 6.0* 5.9* 5.9*  HCT 19.8* 16.7* 17.0* 17.3* 17.3*  MCV 76.2* 75.9* 77.3* 80.1 82.4  PLT 355 280 250 269 291   Cardiac  Enzymes: No results for input(s): CKTOTAL, CKMB, CKMBINDEX, TROPONINI in the last 168 hours. BNP (last 3 results) No results for input(s): BNP in the last 8760 hours.  ProBNP (last 3 results) No results for input(s): PROBNP in the last 8760 hours.  CBG: No results for input(s): GLUCAP in the last 168 hours.  Recent Results (from the past 240 hour(s))  Culture, blood (routine x 2)     Status: None (Preliminary result)   Collection Time: 08/24/16 11:28 PM  Result Value Ref Range Status   Specimen Description BLOOD RIGHT HAND  Final   Special Requests   Final    BOTTLES DRAWN AEROBIC AND ANAEROBIC Blood Culture adequate volume   Culture   Final    NO GROWTH 2 DAYS Performed at [facility], [address], [place], [place] [address]    Report Status PENDING  Incomplete  Culture, blood (routine x 2)     Status: None (Preliminary result)   Collection Time: 08/24/16 11:28 PM  Result Value Ref Range Status   Specimen Description BLOOD LEFT ANTECUBITAL  Final   Special Requests   Final    BOTTLES DRAWN AEROBIC AND ANAEROBIC Blood Culture results may not be optimal due to an excessive volume of blood received in culture bottles   Culture   Final    NO GROWTH 2 DAYS Performed at [facility], [address] [place], [place] [address]    Report Status PENDING  Incomplete     Studies: Dg Chest 2 View  Result Date: 08/24/2016 CLINICAL DATA:  Mid chest pain and weakness today. Sickle cell crisis. EXAM: CHEST  2 VIEW COMPARISON:  08/22/2016 FINDINGS: The heart is mildly enlarged but stable. Very low lung volumes with vascular crowding and streaky atelectasis. Mild vascular congestion without overt pulmonary edema. No pleural effusions. The bony thorax is intact. IMPRESSION: Low lung volumes with vascular crowding and atelectasis. Vascular congestion without overt pulmonary edema. Mild cardiac enlargement, stable. Electronically Signed   By: Rudie Meyer M.D.   On: 08/24/2016  16:21   Dg Chest 2 View  Result Date: 08/22/2016 CLINICAL DATA:  Sickle cell crisis beginning today, chest pain. EXAM: CHEST  2 VIEW COMPARISON:  Chest radiograph November 19, 2013 FINDINGS: Cardiac silhouette is top-normal in size, mediastinal silhouette is unremarkable. Mild bronchitic changes. No pleural effusions or focal consolidations. Trachea projects midline and there is no pneumothorax. Soft tissue planes and included osseous structures are non-suspicious. Surgical clips in the included right abdomen compatible with cholecystectomy. IMPRESSION: Borderline cardiomegaly. Mild bronchitic changes without focal consolidation. Electronically Signed   By: Awilda Metro M.D.   On: 08/22/2016 17:14   Dg Chest Port 1 View  Result Date: 08/24/2016 CLINICAL DATA:  Sickle cell  crisis.  Fever starting about our ago. EXAM: PORTABLE CHEST 1 VIEW COMPARISON:  08/24/2016 FINDINGS: Cardiac enlargement without vascular congestion. No edema or consolidation. No blunting of costophrenic angles. No pneumothorax. Mediastinal contours appear intact. IMPRESSION: Cardiac enlargement.  No evidence of active pulmonary disease. Electronically Signed   By: Burman Nieves M.D.   On: 08/24/2016 23:53    Scheduled Meds: . diphenhydrAMINE  25 mg Oral Q6H  . enoxaparin (LOVENOX) injection  40 mg Subcutaneous Q24H  . folic acid  1 mg Oral q morning - 10a  . ketorolac  15 mg Intravenous Q6H  . morphine   Intravenous Q4H  . senna-docusate  1 tablet Oral BID  . Vitamin D (Ergocalciferol)  50,000 Units Oral Q Wed   Continuous Infusions: . sodium chloride    . dextrose 5 % and 0.45% NaCl 10 mL/hr at 08/26/16 0116  . diphenhydrAMINE (BENADRYL) IVPB(SICKLE CELL ONLY)      Active Problems:   Hb-SS disease with crisis (HCC)    In excess of 30 minutes spent during this visit. Greater than 50% involved face to face contact with the patient for assessment, counseling and coordination of care.

## 2016-08-28 ENCOUNTER — Inpatient Hospital Stay (HOSPITAL_COMMUNITY): Payer: Medicare Other

## 2016-08-28 LAB — BASIC METABOLIC PANEL
Anion gap: 7 (ref 5–15)
BUN: 7 mg/dL (ref 6–20)
CHLORIDE: 108 mmol/L (ref 101–111)
CO2: 25 mmol/L (ref 22–32)
Calcium: 8 mg/dL — ABNORMAL LOW (ref 8.9–10.3)
Creatinine, Ser: 0.66 mg/dL (ref 0.61–1.24)
GFR calc Af Amer: >60 mL/min (ref >60)
GFR calc non Af Amer: >60 mL/min (ref >60)
Glucose, Bld: 92 mg/dL (ref 65–99)
POTASSIUM: 4.3 mmol/L (ref 3.5–5.1)
SODIUM: 140 mmol/L (ref 135–145)

## 2016-08-28 LAB — CBC WITH DIFFERENTIAL/PLATELET
BASOS ABS: 0 10*3/uL (ref 0.0–0.1)
BASOS PCT: 0 %
Band Neutrophils: 3 %
Eosinophils Absolute: 0.8 10*3/uL — ABNORMAL HIGH (ref 0.0–0.7)
Eosinophils Relative: 4 %
HCT: 19.3 % — ABNORMAL LOW (ref 39.0–52.0)
Hemoglobin: 6.2 g/dL — CL (ref 13.0–17.0)
LYMPHS ABS: 4.8 10*3/uL — AB (ref 0.7–4.0)
Lymphocytes Relative: 23 %
MCH: 25.8 pg — AB (ref 26.0–34.0)
MCHC: 32.1 g/dL (ref 30.0–36.0)
MCV: 80.4 fL (ref 78.0–100.0)
MONO ABS: 1 10*3/uL (ref 0.1–1.0)
Monocytes Relative: 5 %
NEUTROS PCT: 65 %
Neutro Abs: 14.1 10*3/uL — ABNORMAL HIGH (ref 1.7–7.7)
PLATELETS: 331 10*3/uL (ref 150–400)
RBC: 2.4 MIL/uL — ABNORMAL LOW (ref 4.22–5.81)
RDW: 29.7 % — AB (ref 11.5–15.5)
WBC: 20.7 10*3/uL — ABNORMAL HIGH (ref 4.0–10.5)

## 2016-08-28 LAB — TYPE AND SCREEN
ABO/RH(D): O POS
ANTIBODY SCREEN: NEGATIVE
Unit division: 0

## 2016-08-28 LAB — BPAM RBC
Blood Product Expiration Date: 201805172359
ISSUE DATE / TIME: 201804211607
UNIT TYPE AND RH: 5100

## 2016-08-28 LAB — RETICULOCYTES
RBC.: 2.4 MIL/uL — AB (ref 4.22–5.81)
Retic Count, Absolute: 477.6 10*3/uL — ABNORMAL HIGH (ref 19.0–186.0)
Retic Ct Pct: 19.9 % — ABNORMAL HIGH (ref 0.4–3.1)

## 2016-08-28 LAB — PREPARE RBC (CROSSMATCH)

## 2016-08-28 MED ORDER — AZITHROMYCIN 250 MG PO TABS
500.0000 mg | ORAL_TABLET | ORAL | Status: DC
  Administered 2016-08-28: 500 mg via ORAL
  Filled 2016-08-28: qty 2

## 2016-08-28 MED ORDER — SODIUM CHLORIDE 0.9 % IV SOLN: Freq: Once | INTRAVENOUS | Status: DC

## 2016-08-28 MED ORDER — OXYCODONE HCL 5 MG PO TABS
5.0000 mg | ORAL_TABLET | ORAL | Status: DC
  Administered 2016-08-28 – 2016-08-29 (×6): 5 mg via ORAL
  Filled 2016-08-28 (×6): qty 1

## 2016-08-28 MED ORDER — DEXTROSE 5 % IV SOLN
1.0000 g | INTRAVENOUS | Status: DC
  Administered 2016-08-28: 1 g via INTRAVENOUS
  Filled 2016-08-28 (×2): qty 10

## 2016-08-28 NOTE — Progress Notes (Signed)
Patient ID: Nicolas Rogers, male   DOB: 03-Apr-1996, 21 y.o.   MRN: [id number] CXR reviewed. Will start on antibiotics and transfuse 1 unit of blood.  MATTHEWS,MICHELLE A.

## 2016-08-28 NOTE — Progress Notes (Signed)
  Appetite is fair. Rating pain a 2. Sleeping at long intervals. Family in at bedside. Pt to be transfused with 1 unit RBC. Wearing O2 @ 3L.

## 2016-08-29 ENCOUNTER — Encounter: Payer: Self-pay | Admitting: Internal Medicine

## 2016-08-29 DIAGNOSIS — J189 Pneumonia, unspecified organism: Secondary | ICD-10-CM | POA: Diagnosis present

## 2016-08-29 DIAGNOSIS — J181 Lobar pneumonia, unspecified organism: Secondary | ICD-10-CM

## 2016-08-29 DIAGNOSIS — D599 Acquired hemolytic anemia, unspecified: Secondary | ICD-10-CM

## 2016-08-29 DIAGNOSIS — D5701 Hb-SS disease with acute chest syndrome: Secondary | ICD-10-CM

## 2016-08-29 LAB — CBC WITH DIFFERENTIAL/PLATELET
Band Neutrophils: 0 %
Basophils Absolute: 0 10*3/uL (ref 0.0–0.1)
Basophils Relative: 0 %
Blasts: 0 %
EOS ABS: 1 10*3/uL — AB (ref 0.0–0.7)
EOS PCT: 5 %
HCT: 21.3 % — ABNORMAL LOW (ref 39.0–52.0)
HEMOGLOBIN: 6.9 g/dL — AB (ref 13.0–17.0)
Lymphocytes Relative: 27 %
Lymphs Abs: 5.4 10*3/uL — ABNORMAL HIGH (ref 0.7–4.0)
MCH: 26.2 pg (ref 26.0–34.0)
MCHC: 32.4 g/dL (ref 30.0–36.0)
MCV: 81 fL (ref 78.0–100.0)
METAMYELOCYTES PCT: 0 %
MONO ABS: 1.6 10*3/uL — AB (ref 0.1–1.0)
MYELOCYTES: 0 %
Monocytes Relative: 8 %
Neutro Abs: 12 10*3/uL — ABNORMAL HIGH (ref 1.7–7.7)
Neutrophils Relative %: 60 %
PLATELETS: 414 10*3/uL — AB (ref 150–400)
PROMYELOCYTES ABS: 0 %
RBC: 2.63 MIL/uL — AB (ref 4.22–5.81)
RDW: 28.2 % — ABNORMAL HIGH (ref 11.5–15.5)
WBC: 20 10*3/uL — AB (ref 4.0–10.5)
nRBC: 0 /100 WBC

## 2016-08-29 LAB — HIV ANTIBODY (ROUTINE TESTING W REFLEX): HIV SCREEN 4TH GENERATION: NONREACTIVE

## 2016-08-29 LAB — RETICULOCYTES
RBC.: 2.63 MIL/uL — ABNORMAL LOW (ref 4.22–5.81)
RETIC COUNT ABSOLUTE: 473.4 10*3/uL — AB (ref 19.0–186.0)
RETIC CT PCT: 18 % — AB (ref 0.4–3.1)

## 2016-08-29 LAB — STREP PNEUMONIAE URINARY ANTIGEN: Strep Pneumo Urinary Antigen: NEGATIVE

## 2016-08-29 MED ORDER — HYDROXYUREA 500 MG PO CAPS: 1000.0000 mg | ORAL_CAPSULE | Freq: Every morning | ORAL | Status: DC

## 2016-08-29 MED ORDER — LEVOFLOXACIN 750 MG PO TABS: 750.0000 mg | ORAL_TABLET | Freq: Every day | ORAL | 0 refills | Status: AC

## 2016-08-29 NOTE — Care Management Important Message (Signed)
Important Message  Patient Details  Name: Nachmen Mansel MRN: [id number] Date of Birth: 1995-07-09   Medicare Important Message Given:  Yes    Caren Macadam 08/29/2016, 11:13 AMImportant Message  Patient Details  Name: Mamoudou Mulvehill MRN: [id number] Date of Birth: 02-19-1996   Medicare Important Message Given:  Yes    Caren Macadam 08/29/2016, 11:13 AM

## 2016-08-29 NOTE — Progress Notes (Signed)
Pt states his pain a 1, Rt. Collar bone. Discharge instructions explained to pt, prescription called in to pharmacy. Both IV's discontinued. Mother in to take pt home. Discharged via wheelchair.

## 2016-08-29 NOTE — Discharge Summary (Signed)
Nicolas Rogers MRN: [id number] DOB/AGE: March 28, 1996 21 y.o.  Admit date: 08/24/2016 Discharge date: 08/29/2016  Primary Care Physician:  Massie Maroon, FNP   Discharge Diagnoses:   Patient Active Problem List   Diagnosis Date Noted  . Hb-SS disease with acute chest syndrome (HCC) 08/29/2016  . CAP (community acquired pneumonia) 08/29/2016  . Hb-SS disease with crisis (HCC) 08/23/2016  . Vitamin D deficiency 03/12/2014  . Need for immunization against influenza 03/12/2014  . Hb-SS disease without crisis (HCC) 01/28/2013    DISCHARGE MEDICATION: Allergies as of 08/29/2016   No Known Allergies     Medication List    TAKE these medications   folic acid 1 MG tablet Commonly known as:  FOLVITE Take 1 tablet (1 mg total) by mouth every morning.   hydroxyurea 500 MG capsule Commonly known as:  HYDREA Take 2 capsules (1,000 mg total) by mouth every morning. May take with food to minimize GI side effects.   ibuprofen 600 MG tablet Commonly known as:  ADVIL,MOTRIN Take 1 tablet (600 mg total) by mouth every 8 (eight) hours as needed.   levofloxacin 750 MG tablet Commonly known as:  LEVAQUIN Take 1 tablet (750 mg total) by mouth daily.   oxyCODONE-acetaminophen 5-325 MG tablet Commonly known as:  PERCOCET/ROXICET Take 1 tablet by mouth every 6 (six) hours as needed for severe pain.   Vitamin D (Ergocalciferol) 50000 units Caps capsule Commonly known as:  DRISDOL Take 50,000 Units by mouth every Wednesday.         Consults:    SIGNIFICANT DIAGNOSTIC STUDIES:  Dg Chest 2 View  Result Date: 08/28/2016 CLINICAL DATA:  21 y/o M; history of sickle cell disease. Presenting with hypoxia and leukocytosis. EXAM: CHEST  2 VIEW COMPARISON:  08/24/2016 FINDINGS: Left lower lobe consolidation and faint opacity right lung base. Small bilateral pleural effusions. Stable mild cardiomegaly. Pulmonary venous hypertension. Right upper quadrant cholecystectomy clips. No acute osseous  abnormality is evident. IMPRESSION: New left lower lobe consolidation and faint right lung base opacity may represent pneumonia or acute chest syndrome. Small bilateral pleural effusions. Stable mild cardiomegaly and pulmonary venous hypertension. These results will be called to the ordering clinician or representative by the Radiologist Assistant, and communication documented in the PACS or zVision Dashboard. Electronically Signed   By: Mitzi Hansen M.D.   On: 08/28/2016 16:10   Dg Chest 2 View  Result Date: 08/24/2016 CLINICAL DATA:  Mid chest pain and weakness today. Sickle cell crisis. EXAM: CHEST  2 VIEW COMPARISON:  08/22/2016 FINDINGS: The heart is mildly enlarged but stable. Very low lung volumes with vascular crowding and streaky atelectasis. Mild vascular congestion without overt pulmonary edema. No pleural effusions. The bony thorax is intact. IMPRESSION: Low lung volumes with vascular crowding and atelectasis. Vascular congestion without overt pulmonary edema. Mild cardiac enlargement, stable. Electronically Signed   By: Rudie Meyer M.D.   On: 08/24/2016 16:21   Dg Chest 2 View  Result Date: 08/22/2016 CLINICAL DATA:  Sickle cell crisis beginning today, chest pain. EXAM: CHEST  2 VIEW COMPARISON:  Chest radiograph November 19, 2013 FINDINGS: Cardiac silhouette is top-normal in size, mediastinal silhouette is unremarkable. Mild bronchitic changes. No pleural effusions or focal consolidations. Trachea projects midline and there is no pneumothorax. Soft tissue planes and included osseous structures are non-suspicious. Surgical clips in the included right abdomen compatible with cholecystectomy. IMPRESSION: Borderline cardiomegaly. Mild bronchitic changes without focal consolidation. Electronically Signed   By: Awilda Metro M.D.   On: 08/22/2016  17:14   Dg Chest Port 1 View  Result Date: 08/24/2016 CLINICAL DATA:  Sickle cell crisis.  Fever starting about our ago. EXAM: PORTABLE  CHEST 1 VIEW COMPARISON:  08/24/2016 FINDINGS: Cardiac enlargement without vascular congestion. No edema or consolidation. No blunting of costophrenic angles. No pneumothorax. Mediastinal contours appear intact. IMPRESSION: Cardiac enlargement.  No evidence of active pulmonary disease. Electronically Signed   By: Burman Nieves M.D.   On: 08/24/2016 23:53      Recent Results (from the past 240 hour(s))  Culture, blood (routine x 2)     Status: None (Preliminary result)   Collection Time: 08/24/16 11:28 PM  Result Value Ref Range Status   Specimen Description BLOOD RIGHT HAND  Final   Special Requests   Final    BOTTLES DRAWN AEROBIC AND ANAEROBIC Blood Culture adequate volume   Culture   Final    NO GROWTH 3 DAYS Performed at [facility], [address] [place], [place] [address]    Report Status PENDING  Incomplete  Culture, blood (routine x 2)     Status: None (Preliminary result)   Collection Time: 08/24/16 11:28 PM  Result Value Ref Range Status   Specimen Description BLOOD LEFT ANTECUBITAL  Final   Special Requests   Final    BOTTLES DRAWN AEROBIC AND ANAEROBIC Blood Culture results may not be optimal due to an excessive volume of blood received in culture bottles   Culture   Final    NO GROWTH 3 DAYS Performed at [facility], [address] [place], [place] [address]    Report Status PENDING  Incomplete    BRIEF ADMITTING H & P: This is an opiate naive patient with Hb SS who presents to the ED with 2 days of pain characteristic of Sickle Cell Crisis. Pt was seen in the [facility] yesterday and treated with Dilaudid PCA. Pt reports that the treatment afforded minimal relief of pain and that he was given a prescription for Percocet which he took several times last night with minimal relief. Currently he rates the pain as 6-7/10 as compared to 0/10 ona  Usual basis. He also had an episode of Priapism which lasted about 2 hours and resolved spontaneously. Pt  states that he experiences Priapism about 3-4 x month and he usually treats it with heat and exercise.   Pt denies any SOB, Cough, Focal weakness, headache, fever, chills, nausea, vomiting or diarrhea.   In the ED he had a vascular U/S performed due to pain in the right leg and it was negative for DVT. He received 3 doses of Dilaudid and his pain is still at 7/10. I am asked to admit him for Sickle Cell Crisis.    Hospital Course:  Opiate tolerant patient with Hb SS admitted with sickle cell crisis. He was treated with Morphine PCA and Toradol. He did well with control of his pain and was quickly transitioned to oral analgesics. However his hospital course was prolonged by a development of fever without any focus of infection. He was observed without antibiotics and the fever subsided. The patient also had a decrease in Hb by > 2 g/dL and Hydrea was held.  He received a transfusion of 1 unit RBC's.  However after 24 hours the fever re-occurred and patient was hypoxic. Pt had already had a CXR on admission and a subsequent CXR 48 hours later when he had the 1st fever that showed no evidence of lung parenchymal disease.  However with the continued hypoxia despite transfusion, and elevated WBC CXR was repeated which showed a new LLL consolidation. He was started on antibiotics as well as transfused 1 more unit RBC's. My clinical judgement is that he most likely had acute chest syndrome. However although he had no cough and the chest wall pain has resolved, given the findings on CXR in addition to the fever a CAP cannot be ignored. So erring on the side of caution, he will be continued on a course of antibiotics (Levaquin) as coverage for CAP. At the time of discharge he was afebrile and saturations were 96% on RA with ambulation. His Hydrea was restarted and he is to follow up with his Primary Provider in 3-5 days for evaluation.    Disposition and Follow-up: Pt is discharged home in good condition.     DISCHARGE EXAM:  General: Alert, awake, oriented x3, in no apparent distress. Well appearing HEENT: [place]/AT PEERL, EOMI, anicteric. Neck: Trachea midline, no masses, no thyromegal,y no JVD, no carotid bruit OROPHARYNX: Moist, No exudate/ erythema/lesions.  Heart: Regular rate and rhythm, without murmurs, rubs, gallops or S3. PMI non-displaced. Exam reveals no decreased pulses. Pulmonary/Chest: Normal effort. Breath sounds normal. No. Apnea. Clear to auscultation,no stridor,  no wheezing and no rhonchi noted. No respiratory distress and no tenderness noted. Abdomen: Soft, nontender, nondistended, normal bowel sounds, no masses no hepatosplenomegaly noted. No fluid wave and no ascites. There is no guarding or rebound. Neuro: Alert and oriented to person, place and time. Normal motor skills, Displays no atrophy or tremors and exhibits normal muscle tone.  No focal neurological deficits noted cranial nerves II through XII grossly intact. No sensory deficit noted.  Strength at baseline in bilateral upper and lower extremities. Gait normal. Musculoskeletal: No warmth swelling or erythema around joints, no spinal tenderness noted. Psychiatric: Patient alert and oriented x3, good insight and cognition, good recent to remote recall. Lymph node survey: No cervical axillary or inguinal lymphadenopathy noted. Skin: Skin is warm and dry. No bruising, no ecchymosis and no rash noted. Pt is not diaphoretic. No erythema. No pallor    Blood pressure 116/66, pulse 79, temperature 98.3 F (36.8 C), temperature source Oral, resp. rate 19, height 6' (1.829 m), weight 73.5 kg (162 lb), SpO2 96 %.   Recent Labs  08/28/16 1349  NA 140  K 4.3  CL 108  CO2 25  GLUCOSE 92  BUN 7  CREATININE 0.66  CALCIUM 8.0*   No results for input(s): AST, ALT, ALKPHOS, BILITOT, PROT, ALBUMIN in the last 72 hours. No results for input(s): LIPASE, AMYLASE in the last 72 hours.  Recent Labs  08/28/16 1349  08/29/16 0740  WBC 20.7* 20.0*  NEUTROABS 14.1* 12.0*  HGB 6.2* 6.9*  HCT 19.3* 21.3*  MCV 80.4 81.0  PLT 331 414*     Total time spent including face to face and decision making was greater than 30 minutes  Signed: MATTHEWS,MICHELLE A. 08/29/2016, 12:17 PM

## 2016-08-30 LAB — CULTURE, BLOOD (ROUTINE X 2)
CULTURE: NO GROWTH
Culture: NO GROWTH
Special Requests: ADEQUATE

## 2016-08-30 LAB — BPAM RBC
Blood Product Expiration Date: 201805212359
ISSUE DATE / TIME: 201804230101
Unit Type and Rh: 5100

## 2016-08-30 LAB — TYPE AND SCREEN
ABO/RH(D): O POS
Antibody Screen: NEGATIVE
UNIT DIVISION: 0

## 2016-09-01 ENCOUNTER — Other Ambulatory Visit: Payer: Self-pay | Admitting: Family Medicine

## 2016-09-01 ENCOUNTER — Ambulatory Visit (INDEPENDENT_AMBULATORY_CARE_PROVIDER_SITE_OTHER): Payer: Medicare Other | Admitting: Family Medicine

## 2016-09-01 ENCOUNTER — Encounter: Payer: Self-pay | Admitting: Family Medicine

## 2016-09-01 VITALS — BP 112/68 | HR 83 | Temp 98.9°F | Resp 14 | Ht 71.0 in | Wt 149.0 lb

## 2016-09-01 DIAGNOSIS — R441 Visual hallucinations: Secondary | ICD-10-CM | POA: Diagnosis not present

## 2016-09-01 DIAGNOSIS — Z79891 Long term (current) use of opiate analgesic: Secondary | ICD-10-CM | POA: Diagnosis not present

## 2016-09-01 DIAGNOSIS — R440 Auditory hallucinations: Secondary | ICD-10-CM

## 2016-09-01 DIAGNOSIS — D571 Sickle-cell disease without crisis: Secondary | ICD-10-CM

## 2016-09-01 DIAGNOSIS — F411 Generalized anxiety disorder: Secondary | ICD-10-CM | POA: Insufficient documentation

## 2016-09-01 MED ORDER — PAROXETINE HCL 20 MG PO TABS: 20.0000 mg | ORAL_TABLET | Freq: Every day | ORAL | 1 refills | Status: DC

## 2016-09-01 MED ORDER — L-GLUTAMINE ORAL POWDER: 10.0000 g | PACK | Freq: Two times a day (BID) | ORAL | 5 refills | Status: DC

## 2016-09-01 NOTE — Progress Notes (Signed)
Subjective:    Patient ID: Nicolas Rogers, male    DOB: 1996/03/09, 21 y.o.   MRN: [id number]  HPI Mr. 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 He was admitted to inpatient services on 08/24/2016 in sickle cell crisis. He says that pain intensity has improved since hospital discharge. He has been taking Percocet 5-325 mg infrequently.  He reports that he feels well and is without complaint.  Patient reports that he primarily takes Ibuprofen for OTC. He denies fever, chest pain, shortness of breath, nausea, vomiting, or diarrhea.   Patient complains of increased anxiety. He also endorses visual and auditory hallucinations. The last time he had auditory hallucinations was on 08/11/2016. He says that voices told him to '"jump from a building".  He has the following symptoms: feelings of losing control, racing thoughts. Onset of symptoms was several months ago.  He denies current suicidal and homicidal ideation. Family history is not significant for depression or anxiety. He has not had any past treatment for anxiety.   Past Medical History:  Diagnosis Date  . Cholecystolithiasis   . Sickle cell anemia (HCC)    Social History   Social History  . Marital status: Single    Spouse name: N/A  . Number of children: N/A  . Years of education: N/A   Occupational History  . Not on file.   Social History Main Topics  . Smoking status: Never Smoker  . Smokeless tobacco: Never Used  . Alcohol use No  . Drug use: No  . Sexual activity: Not on file   Other Topics Concern  . Not on file   Social History Narrative  . No narrative on file  No Known Allergies Immunization History  Administered Date(s) Administered  . Influenza,inj,Quad PF,36+ Mos 03/12/2014, 02/23/2015  . Pneumococcal Conjugate-13 02/23/2015  . Pneumococcal Polysaccharide-23 12/09/2013  . Tdap 09/08/2012   No Known Allergies Review of Systems  Constitutional: Negative.  Negative for  fatigue and fever.  HENT: Negative.   Eyes: Negative.   Respiratory: Negative.   Cardiovascular: Negative.  Negative for palpitations and leg swelling.  Gastrointestinal: Negative.  Negative for constipation, diarrhea and nausea.  Endocrine: Negative.   Genitourinary: Negative.   Musculoskeletal: Negative for myalgias and neck pain.       Periodic pain to sternum  Skin: Negative.   Allergic/Immunologic: Negative.   Neurological: Negative.  Negative for dizziness, syncope and headaches.  Hematological: Negative.   Psychiatric/Behavioral: Positive for hallucinations (visual an auditory) and suicidal ideas (previous suicidal ideations). Negative for sleep disturbance. The patient is nervous/anxious.        Objective:   Physical Exam  Constitutional: He is oriented to person, place, and time. He appears well-developed and well-nourished.  HENT:  Head: Normocephalic and atraumatic.  Right Ear: External ear normal.  Left Ear: External ear normal.  Mouth/Throat: Oropharynx is clear and moist.  Eyes: Conjunctivae are normal. Pupils are equal, round, and reactive to light. Right eye exhibits no discharge. Left eye exhibits no discharge.  Neck: Normal range of motion. Neck supple.  Pulmonary/Chest: Effort normal and breath sounds normal.  Abdominal: Soft. Bowel sounds are normal.  Musculoskeletal: Normal range of motion. He exhibits no edema or tenderness.  Neurological: He is alert and oriented to person, place, and time.  Skin: Skin is warm and dry.  Psychiatric: He has a normal mood and affect. His behavior is normal. Judgment and thought content normal.  BP 112/68 (BP Location: Left Arm, Patient Position: Sitting, Cuff Size: Normal)   Pulse 83   Temp 98.9 F (37.2 C) (Oral)   Resp 14   Ht  (1.803 m)   Wt 149 lb (67.6 kg)   SpO2 98%   BMI 20.78 kg/m  Assessment & Plan:  1. Hb-SS disease without crisis Continue Hydrea 500 mg twice daily. Will check ANC, hemolobin,  platelet and reticulocyte count. We discussed the need for good hydration, monitoring of hydration status, avoidance of heat, cold, stress, and infection triggers. We discussed the risks and benefits of Hydrea, including bone marrow suppression, the possibility of GI upset, skin ulcers, hair thinning, and teratogenicity. The patient was reminded of the need to seek medical attention of any symptoms of bleeding, anemia, or infection. Continue folic acid 1 mg daily to prevent aplastic bone marrow crises.  -He does not have a hematologist. Sent a referral on previous visit. Patient states that he did not schedule an appointment  Eye - High risk of proliferative retinopathy. Annual eye exam with retinal exam recommended to patient.  Sent referral at last visit. Will schedule an appointment with Dr. Elmer Picker Recommend Costella Hatcher 10 g twice daily. Discussed medication at length. The findings showed a 25% reduction in the frequency of sickle-cell crises, a 33% decrease in hospitalisation rates, fewer hospital visits due to sickle-cell pain and 60% fewer occurrences of acute chest syndrome in patients administered with Endari.  - L-glutamine (ENDARI) 5 g PACK Powder Packet; Take 10 g by mouth 2 (two) times daily.  Dispense: 120 packet; Refill: 5  2. Visual hallucination - Ambulatory referral to Psychiatry  3. Auditory hallucinations - Ambulatory referral to Psychiatry  4. Chronic prescription opiate use - Pain Mgmt, Profile 8 w/Conf, U  5. Generalized anxiety disorder GAD 7 : Generalized Anxiety Score 09/01/2016  Nervous, Anxious, on Edge 1  Control/stop worrying 3  Worry too much - different things 3  Trouble relaxing 1  Restless 1  Easily annoyed or irritable 0  Afraid - awful might happen 0  Total GAD 7 Score 9  Anxiety Difficulty Not difficult at all    - PARoxetine (PAXIL) 20 MG tablet; Take 1 tablet (20 mg total) by mouth daily.  Dispense: 30 tablet; Refill: 1 - Ambulatory referral to  Psychiatry   RTC: 1 month for anxiety   Nolon Nations  MSN, FNP-C [facility] [address] [place], [place] [address] [phone]

## 2016-09-01 NOTE — Patient Instructions (Addendum)
Generalized Anxiety:  Will start Paxil 20 mg daily. Will follow up in office in 1 month. Sent a referral to [facility]/psychiatry for visual and auditory hallucinations.  Sickle cell anemia: Will start Endari 10 grams twice daily to a 4-6 ounce drink.  The findings showed a 25% reduction in the frequency of sickle-cell crises, a 33% decrease in hospitalisation rates, fewer hospital visits due to sickle-cell pain and 60% fewer occurrences of acute chest syndrome in patients administered with Endari.      Paroxetine tablets What is this medicine? PAROXETINE (pa ROX e teen) is used to treat depression. It may also be used to treat anxiety disorders, obsessive compulsive disorder, panic attacks, post traumatic stress, and premenstrual dysphoric disorder (PMDD). This medicine may be used for other purposes; ask your health care provider or pharmacist if you have questions. COMMON BRAND NAME(S): Paxil, Pexeva What should I tell my health care provider before I take this medicine? They need to know if you have any of these conditions: -bipolar disorder or a family history of bipolar disorder -bleeding disorders -glaucoma -heart disease -kidney disease -liver disease -low levels of sodium in the blood -seizures -suicidal thoughts, plans, or attempt; a previous suicide attempt by you or a family member -take MAOIs like Carbex, Eldepryl, Marplan, Nardil, and Parnate -take medicines that treat or prevent blood clots -thyroid disease -an unusual or allergic reaction to paroxetine, other medicines, foods, dyes, or preservatives -pregnant or trying to get pregnant -breast-feeding How should I use this medicine? Take this medicine by mouth with a glass of water. Follow the directions on the prescription label. You can take it with or without food. Take your medicine at regular intervals. Do not take your medicine more often than directed. Do not stop taking this medicine suddenly except upon  the advice of your doctor. Stopping this medicine too quickly may cause serious side effects or your condition may worsen. A special MedGuide will be given to you by the pharmacist with each prescription and refill. Be sure to read this information carefully each time. Talk to your pediatrician regarding the use of this medicine in children. Special care may be needed. Overdosage: If you think you have taken too much of this medicine contact a poison control center or emergency room at once. NOTE: This medicine is only for you. Do not share this medicine with others. What if I miss a dose? If you miss a dose, take it as soon as you can. If it is almost time for your next dose, take only that dose. Do not take double or extra doses. What may interact with this medicine? Do not take this medicine with any of the following medications: -linezolid -MAOIs like Carbex, Eldepryl, Marplan, Nardil, and Parnate -methylene blue (injected into a vein) -pimozide -thioridazine This medicine may also interact with the following medications: -alcohol -amphetamines -aspirin and aspirin-like medicines -atomoxetine -certain medicines for depression, anxiety, or psychotic disturbances -certain medicines for irregular heart beat like propafenone, flecainide, encainide, and quinidine -certain medicines for migraine headache like almotriptan, eletriptan, frovatriptan, naratriptan, rizatriptan, sumatriptan, zolmitriptan -cimetidine -digoxin -diuretics -fentanyl -fosamprenavir -furazolidone -isoniazid -lithium -medicines that treat or prevent blood clots like warfarin, enoxaparin, and dalteparin -medicines for sleep -NSAIDs, medicines for pain and inflammation, like ibuprofen or naproxen -phenobarbital -phenytoin -procarbazine -rasagiline -ritonavir -supplements like St. John's wort, kava kava, valerian -tamoxifen -tramadol -tryptophan This list may not describe all possible interactions. Give your  health care provider a list of all the medicines, herbs, non-prescription  drugs, or dietary supplements you use. Also tell them if you smoke, drink alcohol, or use illegal drugs. Some items may interact with your medicine. What should I watch for while using this medicine? Tell your doctor if your symptoms do not get better or if they get worse. Visit your doctor or health care professional for regular checks on your progress. Because it may take several weeks to see the full effects of this medicine, it is important to continue your treatment as prescribed by your doctor. Patients and their families should watch out for new or worsening thoughts of suicide or depression. Also watch out for sudden changes in feelings such as feeling anxious, agitated, panicky, irritable, hostile, aggressive, impulsive, severely restless, overly excited and hyperactive, or not being able to sleep. If this happens, especially at the beginning of treatment or after a change in dose, call your health care professional. Bonita Quin may get drowsy or dizzy. Do not drive, use machinery, or do anything that needs mental alertness until you know how this medicine affects you. Do not stand or sit up quickly, especially if you are an older patient. This reduces the risk of dizzy or fainting spells. Alcohol may interfere with the effect of this medicine. Avoid alcoholic drinks. Your mouth may get dry. Chewing sugarless gum or sucking hard candy, and drinking plenty of water will help. Contact your doctor if the problem does not go away or is severe. What side effects may I notice from receiving this medicine? Side effects that you should report to your doctor or health care professional as soon as possible: -allergic reactions like skin rash, itching or hives, swelling of the face, lips, or tongue -anxious -black, tarry stools -changes in vision -confusion -elevated mood, decreased need for sleep, racing thoughts, impulsive behavior -eye  pain -fast, irregular heartbeat -feeling faint or lightheaded, falls -feeling agitated, angry, or irritable -hallucination, loss of contact with reality -loss of balance or coordination -loss of memory -painful or prolonged erections -restlessness, pacing, inability to keep still -seizures -stiff muscles -suicidal thoughts or other mood changes -trouble sleeping -unusual bleeding or bruising -unusually weak or tired -vomiting Side effects that usually do not require medical attention (report to your doctor or health care professional if they continue or are bothersome): -change in appetite or weight -change in sex drive or performance -diarrhea -dizziness -dry mouth -increased sweating -indigestion, nausea -tired -tremors This list may not describe all possible side effects. Call your doctor for medical advice about side effects. You may report side effects to FDA at 1-800-FDA-1088. Where should I keep my medicine? Keep out of the reach of children. Store at room temperature between 15 and 30 degrees C (59 and 86 degrees F). Keep container tightly closed. Throw away any unused medicine after the expiration date. NOTE: This sheet is a summary. It may not cover all possible information. If you have questions about this medicine, talk to your doctor, pharmacist, or health care provider.  2018 Elsevier/Gold Standard (2015-09-26 15:50:32)

## 2016-09-02 LAB — CULTURE, BLOOD (ROUTINE X 2)
Culture: NO GROWTH
Culture: NO GROWTH

## 2016-09-04 LAB — PAIN MGMT, PROFILE 8 W/CONF, U
6 ACETYLMORPHINE: NEGATIVE ng/mL (ref <10)
AMPHETAMINES: NEGATIVE ng/mL (ref <500)
Alcohol Metabolites: NEGATIVE ng/mL (ref <500)
BENZODIAZEPINES: NEGATIVE ng/mL (ref <100)
Buprenorphine: NEGATIVE ng/mL (ref <2)
Buprenorphine: NEGATIVE ng/mL (ref <5)
COCAINE METABOLITE: NEGATIVE ng/mL (ref <150)
CREATININE: 73.8 mg/dL (ref >=20.0)
Codeine: NEGATIVE ng/mL (ref <50)
Hydrocodone: NEGATIVE ng/mL (ref <50)
Hydromorphone: NEGATIVE ng/mL (ref <50)
MARIJUANA METABOLITE: NEGATIVE ng/mL (ref <20)
MDMA: NEGATIVE ng/mL (ref <500)
MORPHINE: NEGATIVE ng/mL (ref <50)
NORBUPRENORPHINE: NEGATIVE ng/mL (ref <2)
NORHYDROCODONE: NEGATIVE ng/mL (ref <50)
OPIATES: NEGATIVE ng/mL (ref <100)
OXIDANT: NEGATIVE ug/mL (ref <200)
Oxycodone: NEGATIVE ng/mL (ref <100)
PH: 7.78 (ref 4.5–9.0)
Please note:: 0

## 2016-09-07 ENCOUNTER — Other Ambulatory Visit: Payer: Self-pay

## 2016-09-07 DIAGNOSIS — D571 Sickle-cell disease without crisis: Secondary | ICD-10-CM

## 2016-09-07 DIAGNOSIS — F411 Generalized anxiety disorder: Secondary | ICD-10-CM

## 2016-09-07 MED ORDER — L-GLUTAMINE ORAL POWDER: 10.0000 g | PACK | Freq: Two times a day (BID) | ORAL | 5 refills | Status: DC

## 2016-09-07 MED ORDER — PAROXETINE HCL 20 MG PO TABS: 20.0000 mg | ORAL_TABLET | Freq: Every day | ORAL | 1 refills | Status: DC

## 2016-09-07 NOTE — Telephone Encounter (Signed)
Re-sent in medications to corrected pharmacy. Thanks!

## 2016-09-14 ENCOUNTER — Ambulatory Visit (HOSPITAL_COMMUNITY): Payer: Self-pay | Admitting: Psychiatry

## 2016-10-04 ENCOUNTER — Ambulatory Visit: Payer: Self-pay | Admitting: Family Medicine

## 2016-10-28 ENCOUNTER — Ambulatory Visit: Payer: BLUE CROSS/BLUE SHIELD | Admitting: Family Medicine

## 2016-12-21 ENCOUNTER — Emergency Department (HOSPITAL_COMMUNITY)
Admission: EM | Admit: 2016-12-21 | Discharge: 2016-12-21 | Disposition: A | Discharge status: Home / Self Care | Payer: Medicare Other | Attending: Emergency Medicine | Admitting: Emergency Medicine

## 2016-12-21 ENCOUNTER — Encounter (HOSPITAL_COMMUNITY): Payer: Self-pay | Admitting: Emergency Medicine

## 2016-12-21 DIAGNOSIS — Z79899 Other long term (current) drug therapy: Secondary | ICD-10-CM | POA: Insufficient documentation

## 2016-12-21 DIAGNOSIS — R443 Hallucinations, unspecified: Secondary | ICD-10-CM | POA: Diagnosis present

## 2016-12-21 DIAGNOSIS — F1910 Other psychoactive substance abuse, uncomplicated: Secondary | ICD-10-CM

## 2016-12-21 DIAGNOSIS — F19951 Other psychoactive substance use, unspecified with psychoactive substance-induced psychotic disorder with hallucinations: Secondary | ICD-10-CM | POA: Diagnosis not present

## 2016-12-21 HISTORY — DX: Schizophrenia, unspecified: F20.9

## 2016-12-21 NOTE — ED Notes (Signed)
Bed: WTR9 Expected date:  Expected time:  Means of arrival:  Comments: 

## 2016-12-21 NOTE — ED Triage Notes (Addendum)
Pt from home via EMS with c/o involuntary movements/ twitching following smoking THC. Pt has had hx of same before when using THC. Pt calm and cooperative at time of assessment

## 2016-12-21 NOTE — ED Provider Notes (Signed)
[facility]-EMERGENCY DEPT Provider Note   CSN: [id number] Arrival date & time: 12/21/16  0029     History   Chief Complaint Chief Complaint  Patient presents with  . Paranoid    HPI Nicolas Rogers is a 21 y.o. male with a hx of sickle cell anemia, schizophrenia, anxiety, hallucinations presents to the Emergency Department complaining of Hallucinations onset just prior to arrival. Patient reports that every time he smoked marijuana he has hallucinations. He reports smoking marijuana around 3 PM today prior to going to work. After arriving at work, his boss called 911 due to him hallucinating.  Patient reports that he remembers getting into the ambulance, but not what happened to prompt his boss to call 911. He reports that he feels normal at this time. He is not hearing or seeing voices. He is adamant that he does not feel suicidal or homicidal.  Patient denies headache, neck pain, chest pain, shortness of breath, abdominal pain, nausea, vomiting, diarrhea, weakness, dizziness, syncope. No known alleviating factors.   The history is provided by the patient and medical records. No language interpreter was used.    Past Medical History:  Diagnosis Date  . Cholecystolithiasis   . Schizophrenia (HCC)   . Sickle cell anemia [facility](HCC)     Patient Active Problem List   Diagnosis Date Noted  . Anxiety, generalized 09/01/2016  . Visual hallucinations 09/01/2016  . Auditory hallucinations 09/01/2016  . Hb-SS disease with acute chest syndrome (HCC) 08/29/2016  . CAP (community acquired pneumonia) 08/29/2016  . Hb-SS disease with crisis (HCC) 08/23/2016  . Vitamin D deficiency 03/12/2014  . Need for immunization against influenza 03/12/2014  . 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 ADENOIDECTOMY    . TONSILLECTOMY         Home Medications    Prior to Admission medications   Medication Sig Start Date End Date Taking?  Authorizing Provider  folic acid (FOLVITE) 1 MG tablet Take 1 tablet (1 mg total) by mouth every morning. 04/29/16  Yes Massie MaroonHollis, Lachina M, FNP  hydroxyurea (HYDREA) 500 MG capsule Take 2 capsules (1,000 mg total) by mouth every morning. May take with food to minimize GI side effects. 04/29/16  Yes Massie MaroonHollis, Lachina M, FNP  ibuprofen (ADVIL,MOTRIN) 600 MG tablet Take 1 tablet (600 mg total) by mouth every 8 (eight) hours as needed. Patient not taking: Reported on 12/21/2016 08/23/16   Massie MaroonHollis, Lachina M, FNP  L-glutamine (ENDARI) 5 g PACK Powder Packet Take 10 g by mouth 2 (two) times daily. Patient not taking: Reported on 12/21/2016 09/07/16   Massie MaroonHollis, Lachina M, FNP  oxyCODONE-acetaminophen (PERCOCET/ROXICET) 5-325 MG tablet Take 1 tablet by mouth every 6 (six) hours as needed for severe pain. Patient not taking: Reported on 12/21/2016 08/23/16   Massie MaroonHollis, Lachina M, FNP  PARoxetine (PAXIL) 20 MG tablet Take 1 tablet (20 mg total) by mouth daily. Patient not taking: Reported on 12/21/2016 09/07/16   Massie MaroonHollis, Lachina M, FNP    Family History No family history on file.  Social History Social History  Substance Use Topics  . Smoking status: Never Smoker  . Smokeless tobacco: Never Used  . Alcohol use No     Allergies   Patient has no known allergies.   Review of Systems Review of Systems  Constitutional: Negative for appetite change, diaphoresis, fatigue, fever and unexpected weight change.  HENT: Negative for mouth sores.   Eyes: Negative for visual disturbance.  Respiratory: Negative for cough, chest tightness, shortness of breath and wheezing.   Cardiovascular: Negative for chest pain.  Gastrointestinal: Negative for abdominal pain, constipation, diarrhea, nausea and vomiting.  Endocrine: Negative for polydipsia, polyphagia and polyuria.  Genitourinary: Negative for dysuria, frequency, hematuria and urgency.  Musculoskeletal: Negative for back pain and neck stiffness.  Skin: Negative for  rash.  Allergic/Immunologic: Negative for immunocompromised state.  Neurological: Negative for syncope, light-headedness and headaches.  Hematological: Does not bruise/bleed easily.  Psychiatric/Behavioral: Positive for hallucinations ( resolved). Negative for sleep disturbance. The patient is not nervous/anxious.   All other systems reviewed and are negative.    Physical Exam Updated Vital Signs BP (!) 114/59 (BP Location: Right Arm)   Pulse 100   Temp 99.5 F (37.5 C) (Oral)   Resp 16   SpO2 93%   Physical Exam  Constitutional: He is oriented to person, place, and time. He appears well-developed and well-nourished. No distress.  Awake, alert, nontoxic appearance  HENT:  Head: Normocephalic and atraumatic.  Mouth/Throat: Oropharynx is clear and moist. No oropharyngeal exudate.  Eyes: Conjunctivae are normal. No scleral icterus.  Neck: Normal range of motion. Neck supple.  Cardiovascular: Normal rate, regular rhythm and intact distal pulses.   Pulmonary/Chest: Effort normal and breath sounds normal. No respiratory distress. He has no wheezes.  Equal chest expansion  Abdominal: Soft. Bowel sounds are normal. He exhibits no mass. There is no tenderness. There is no rebound and no guarding.  Musculoskeletal: Normal range of motion. He exhibits no edema.  Neurological: He is alert and oriented to person, place, and time. GCS eye subscore is 4. GCS verbal subscore is 5. GCS motor subscore is 6.  Speech is clear and goal oriented Moves extremities without ataxia  Skin: Skin is warm and dry. He is not diaphoretic.  Psychiatric: He has a normal mood and affect. His speech is normal and behavior is normal. Judgment and thought content normal. His mood appears not anxious. His affect is not inappropriate. He is not agitated and not actively hallucinating. Thought content is not paranoid. He expresses no homicidal and no suicidal ideation. He expresses no suicidal plans and no homicidal  plans. He exhibits abnormal recent memory.  Nursing note and vitals reviewed.    ED Treatments / Results   Procedures Procedures (including critical care time)  Medications Ordered in ED Medications - No data to display   Initial Impression / Assessment and Plan / ED Course  I have reviewed the triage vital signs and the nursing notes.  Pertinent labs & imaging results that were available during my care of the patient were reviewed by me and considered in my medical decision making (see chart for details).     Patient is alert and oriented. Well appearing. He denies active hallucinations. He denies suicidal or homicidal ideation. He does not appear paranoid or agitated. He is grossly neurologically intact and ambulates without difficulty. He wishes for discharge home and I think this is reasonable. I have cautioned against continued marijuana usage. I have instructed him to return immediately to the emergency department if hallucinations recur or he develops suicidal or homicidal ideation. Patient states understanding and is in agreement with the plan.  Final Clinical Impressions(s) / ED Diagnoses   Final diagnoses:  Substance abuse  Drug induced hallucinations [facility])    New Prescriptions New Prescriptions   No medications on file     Milta Deiters 12/21/16 0507    Palumbo, April, MD 12/21/16 [phone]

## 2016-12-21 NOTE — Discharge Instructions (Signed)
1. Medications: usual home medications 2. Treatment: rest, drink plenty of fluids,  3. Follow Up: Please followup with your primary doctor in 2-3 days for discussion of your diagnoses and further evaluation after today's visit; if you do not have a primary care doctor use the resource guide provided to find one; Please return to the ER for worsening symptoms, return if hallucinations, high fevers or other concerns.

## 2017-01-02 DIAGNOSIS — F333 Major depressive disorder, recurrent, severe with psychotic symptoms: Secondary | ICD-10-CM | POA: Diagnosis not present

## 2017-02-14 ENCOUNTER — Emergency Department (HOSPITAL_COMMUNITY): Payer: Medicare Other

## 2017-02-14 ENCOUNTER — Encounter (HOSPITAL_COMMUNITY): Payer: Self-pay

## 2017-02-14 ENCOUNTER — Emergency Department (HOSPITAL_COMMUNITY)
Admission: EM | Admit: 2017-02-14 | Discharge: 2017-02-14 | Disposition: A | Discharge status: Other Acute Inpatient Hospital | Payer: Medicare Other | Attending: Emergency Medicine | Admitting: Emergency Medicine

## 2017-02-14 ENCOUNTER — Encounter (HOSPITAL_COMMUNITY): Payer: Self-pay | Admitting: Emergency Medicine

## 2017-02-14 ENCOUNTER — Inpatient Hospital Stay (HOSPITAL_COMMUNITY)
Admission: AD | Admit: 2017-02-14 | Discharge: 2017-02-20 | DRG: 885 | Disposition: A | Discharge status: Home / Self Care | Payer: Medicare Other | Source: Intra-hospital | Attending: Psychiatry | Admitting: Psychiatry

## 2017-02-14 DIAGNOSIS — F332 Major depressive disorder, recurrent severe without psychotic features: Secondary | ICD-10-CM | POA: Diagnosis present

## 2017-02-14 DIAGNOSIS — R9431 Abnormal electrocardiogram [ECG] [EKG]: Secondary | ICD-10-CM | POA: Diagnosis not present

## 2017-02-14 DIAGNOSIS — T50902A Poisoning by unspecified drugs, medicaments and biological substances, intentional self-harm, initial encounter: Secondary | ICD-10-CM

## 2017-02-14 DIAGNOSIS — G4700 Insomnia, unspecified: Secondary | ICD-10-CM | POA: Diagnosis present

## 2017-02-14 DIAGNOSIS — D5701 Hb-SS disease with acute chest syndrome: Secondary | ICD-10-CM | POA: Diagnosis not present

## 2017-02-14 DIAGNOSIS — D571 Sickle-cell disease without crisis: Secondary | ICD-10-CM | POA: Diagnosis present

## 2017-02-14 DIAGNOSIS — T402X2A Poisoning by other opioids, intentional self-harm, initial encounter: Secondary | ICD-10-CM | POA: Diagnosis not present

## 2017-02-14 DIAGNOSIS — X838XXA Intentional self-harm by other specified means, initial encounter: Secondary | ICD-10-CM | POA: Insufficient documentation

## 2017-02-14 DIAGNOSIS — F1910 Other psychoactive substance abuse, uncomplicated: Secondary | ICD-10-CM | POA: Diagnosis not present

## 2017-02-14 DIAGNOSIS — R0600 Dyspnea, unspecified: Secondary | ICD-10-CM | POA: Diagnosis not present

## 2017-02-14 DIAGNOSIS — Z23 Encounter for immunization: Secondary | ICD-10-CM | POA: Diagnosis not present

## 2017-02-14 DIAGNOSIS — F323 Major depressive disorder, single episode, severe with psychotic features: Secondary | ICD-10-CM | POA: Diagnosis present

## 2017-02-14 DIAGNOSIS — F419 Anxiety disorder, unspecified: Secondary | ICD-10-CM | POA: Diagnosis not present

## 2017-02-14 DIAGNOSIS — R4587 Impulsiveness: Secondary | ICD-10-CM | POA: Diagnosis not present

## 2017-02-14 DIAGNOSIS — R4582 Worries: Secondary | ICD-10-CM | POA: Diagnosis not present

## 2017-02-14 DIAGNOSIS — F1290 Cannabis use, unspecified, uncomplicated: Secondary | ICD-10-CM | POA: Diagnosis present

## 2017-02-14 DIAGNOSIS — R5383 Other fatigue: Secondary | ICD-10-CM | POA: Diagnosis not present

## 2017-02-14 DIAGNOSIS — R45851 Suicidal ideations: Secondary | ICD-10-CM | POA: Insufficient documentation

## 2017-02-14 DIAGNOSIS — R5381 Other malaise: Secondary | ICD-10-CM | POA: Diagnosis not present

## 2017-02-14 DIAGNOSIS — Z915 Personal history of self-harm: Secondary | ICD-10-CM | POA: Diagnosis not present

## 2017-02-14 DIAGNOSIS — T1491XA Suicide attempt, initial encounter: Secondary | ICD-10-CM | POA: Diagnosis not present

## 2017-02-14 DIAGNOSIS — F322 Major depressive disorder, single episode, severe without psychotic features: Secondary | ICD-10-CM | POA: Diagnosis present

## 2017-02-14 DIAGNOSIS — T391X2A Poisoning by 4-Aminophenol derivatives, intentional self-harm, initial encounter: Secondary | ICD-10-CM | POA: Diagnosis not present

## 2017-02-14 DIAGNOSIS — F4010 Social phobia, unspecified: Secondary | ICD-10-CM | POA: Diagnosis not present

## 2017-02-14 LAB — CBC
HEMATOCRIT: 22.1 % — AB (ref 39.0–52.0)
HEMOGLOBIN: 7.7 g/dL — AB (ref 13.0–17.0)
MCH: 27.4 pg (ref 26.0–34.0)
MCHC: 34.8 g/dL (ref 30.0–36.0)
MCV: 78.6 fL (ref 78.0–100.0)
Platelets: 418 10*3/uL — ABNORMAL HIGH (ref 150–400)
RBC: 2.81 MIL/uL — ABNORMAL LOW (ref 4.22–5.81)
RDW: 30.1 % — ABNORMAL HIGH (ref 11.5–15.5)
WBC: 13.7 10*3/uL — ABNORMAL HIGH (ref 4.0–10.5)

## 2017-02-14 LAB — RAPID URINE DRUG SCREEN, HOSP PERFORMED
Amphetamines: NOT DETECTED
BARBITURATES: NOT DETECTED
Benzodiazepines: NOT DETECTED
COCAINE: NOT DETECTED
Opiates: NOT DETECTED
TETRAHYDROCANNABINOL: NOT DETECTED

## 2017-02-14 LAB — ACETAMINOPHEN LEVEL: ACETAMINOPHEN (TYLENOL), SERUM: 13 ug/mL (ref 10–30)

## 2017-02-14 LAB — COMPREHENSIVE METABOLIC PANEL
ALBUMIN: 4.2 g/dL (ref 3.5–5.0)
ALK PHOS: 58 U/L (ref 38–126)
ALT: 15 U/L — AB (ref 17–63)
ANION GAP: 9 (ref 5–15)
AST: 48 U/L — AB (ref 15–41)
BILIRUBIN TOTAL: 4.5 mg/dL — AB (ref 0.3–1.2)
BUN: 6 mg/dL (ref 6–20)
CALCIUM: 9 mg/dL (ref 8.9–10.3)
CO2: 25 mmol/L (ref 22–32)
CREATININE: 0.67 mg/dL (ref 0.61–1.24)
Chloride: 107 mmol/L (ref 101–111)
GFR calc Af Amer: >60 mL/min (ref >60)
GFR calc non Af Amer: >60 mL/min (ref >60)
GLUCOSE: 109 mg/dL — AB (ref 65–99)
Potassium: 4.3 mmol/L (ref 3.5–5.1)
SODIUM: 141 mmol/L (ref 135–145)
TOTAL PROTEIN: 7.6 g/dL (ref 6.5–8.1)

## 2017-02-14 LAB — ETHANOL: Alcohol, Ethyl (B): <10 mg/dL (ref <10)

## 2017-02-14 LAB — SALICYLATE LEVEL: Salicylate Lvl: <7 mg/dL (ref 2.8–30.0)

## 2017-02-14 MED ORDER — HYDROXYZINE HCL 25 MG PO TABS: 25.0000 mg | ORAL_TABLET | Freq: Three times a day (TID) | ORAL | Status: DC | PRN

## 2017-02-14 MED ORDER — ALUM & MAG HYDROXIDE-SIMETH 200-200-20 MG/5ML PO SUSP: 30.0000 mL | ORAL | Status: DC | PRN

## 2017-02-14 MED ORDER — ACETAMINOPHEN 325 MG PO TABS: 650.0000 mg | ORAL_TABLET | Freq: Four times a day (QID) | ORAL | Status: DC | PRN

## 2017-02-14 MED ORDER — ONDANSETRON HCL 4 MG PO TABS: 4.0000 mg | ORAL_TABLET | Freq: Three times a day (TID) | ORAL | Status: DC | PRN

## 2017-02-14 MED ORDER — INFLUENZA VAC SPLIT QUAD 0.5 ML IM SUSY
0.5000 mL | PREFILLED_SYRINGE | INTRAMUSCULAR | Status: AC
  Administered 2017-02-15: 0.5 mL via INTRAMUSCULAR
  Filled 2017-02-14: qty 0.5

## 2017-02-14 MED ORDER — TRAZODONE HCL 50 MG PO TABS: 50.0000 mg | ORAL_TABLET | Freq: Every evening | ORAL | Status: DC | PRN

## 2017-02-14 MED ORDER — MAGNESIUM HYDROXIDE 400 MG/5ML PO SUSP: 30.0000 mL | Freq: Every day | ORAL | Status: DC | PRN

## 2017-02-14 NOTE — ED Notes (Signed)
Spoke with poison control.  Suggestions: Obtain 4 hour tylenol level, salicylate, and EKG. 4 hour observation. Subtoxic amount.

## 2017-02-14 NOTE — Progress Notes (Signed)
Patient ID: Nicolas Rogers, male   DOB: August 25, 1995, 21 y.o.   MRN: [id number] Per State regulations 482.30 this chart was reviewed for medical necessity with respect to the patient's admission/duration of stay.    Next review date: 02/17/17  Thurman Coyer, BSN, RN-BC  Case Manager

## 2017-02-14 NOTE — ED Notes (Signed)
Pt came to [facility] involuntary after overdosing on percocet. Pt lives with his brother and mother. He has poor eye contact and denies si thoughts currently. He denies hi and pain. Pt oriented to unit and meal given. Safety maintained.

## 2017-02-14 NOTE — Tx Team (Signed)
Initial Treatment Plan 02/14/2017 7:12 PM Nicolas Rogers QIH:[id number]    PATIENT STRESSORS: Financial difficulties Marital or family conflict Substance abuse   PATIENT STRENGTHS: Wellsite geologist fund of knowledge Supportive family/friends   PATIENT IDENTIFIED PROBLEMS: Depression  Suicidal ideation  "Just to focus on suppressing my suicidal thoughts"  "Help with my depression"               DISCHARGE CRITERIA:  Improved stabilization in mood, thinking, and/or behavior Verbal commitment to aftercare and medication compliance  PRELIMINARY DISCHARGE PLAN: Outpatient therapy Medication management  PATIENT/FAMILY INVOLVEMENT: This treatment plan has been presented to and reviewed with the patient, Nicolas Rogers.  The patient and family have been given the opportunity to ask questions and make suggestions.  Levin Bacon, RN 02/14/2017, 7:12 PM

## 2017-02-14 NOTE — BH Assessment (Signed)
[facility] Assessment Progress Note  Per Thedore Mins, MD, this pt requires psychiatric hospitalization.  Thurman Coyer, RN, [facility] has assigned pt to [facility] Rm [id number]; they will be ready to receive pt between 14:00 and 14:30.  Pt presents under IVC initiated by mobile crisis, and upheld by Dr Jannifer Franklin, and IVC documents have been faxed to [facility].  Pt's nurse, Jan, has been notified, and agrees to call report to [phone].  Pt is to be transported via [employer].   Doylene Canning, MA Triage Specialist [phone]

## 2017-02-14 NOTE — ED Notes (Signed)
Pt walked on rm air around the department and maintained 87% o2 on rm air. Pt sts he does not feel short of breath. Per Melvenia Beam the PA pt is to remain off oxygen. Pt has spo2 monitor on to continue reading his o2 level.

## 2017-02-14 NOTE — Progress Notes (Signed)
02/14/17 1347:  LRT went to pt room to offer activities, pt was with visitor.   Caroll Rancher, LRT/CTRS

## 2017-02-14 NOTE — BH Assessment (Addendum)
Assessment Note  Nicolas Rogers is an 21 y.o. male.  -Clinician reviewed note by Elpidio Anis, PA.  Patient with a history of sickle cell anemia, schizophrenia presents with suicidal gesture. He took #5 Percocets around 11:30 02/13/17 (2 hours prior to arrival). He called the suicide hotline and arrived here under IVC. He has a history of suicidal ideation and attempt. No HI.  When asked why he is at [facility] patient admits to attempted overdose on five percocet.  When asked what his intention was he says he wanted to kill himself.  Patient has had worsening thoughts of suicide over the last several weeks.  Patient texted some of his friends and let them know he was going to kill himself.  Patient called [facility].  They initiated IVC for patient to come to [facility].  Pt says he has no HI.  He reports sometimes having internal voice tell him bad things to the point that "I hear it outside of my head."  He says he sometimes sees a dark hooded figure.  He has not had these hallucinations lately.  Patient admits to smoking marijuana but says it is less than once monthly.  He is not positive for it on his UDS although he says he smoked some last week.  Patient goes to [facility] for medication management.  He is supposed to be seeing a therapist there also.  He has missed a few appointments due to work.  Patient says he has never been inpatient before in a psychiatric hospital.  -Clinician discussed patient care with Donell Sievert, PA who recommends inpatient psychiatric care.  TTS to seek placement.  Diagnosis: MDD, recurrent severe  Past Medical History:  Past Medical History:  Diagnosis Date  . Cholecystolithiasis   . Schizophrenia (HCC)   . Sickle cell anemia (HCC)     Past Surgical History:  Procedure Laterality Date  . ADENOIDECTOMY    . TONSILLECTOMY      Family History: No family history on file.  Social History:  reports that he has never smoked. He has never used smokeless  tobacco. He reports that he does not drink alcohol or use drugs.  Additional Social History:  Alcohol / Drug Use Pain Medications: Percocet and prescription Ibuprophen Prescriptions: Folic acid, hydroxia Over the Counter: None History of alcohol / drug use?: Yes Substance #1 Name of Substance 1: Marijuana 1 - Age of First Use: 21 years of age 39 - Amount (size/oz): One blunt rarely 1 - Frequency: <once in a month 1 - Duration: off and on 1 - Last Use / Amount: Last week  CIWA: CIWA-Ar BP: 119/69 Pulse Rate: 70 COWS:    Allergies: No Known Allergies  Home Medications:  (Not in a hospital admission)  OB/GYN Status:  No LMP for male patient.  General Assessment Data Location of Assessment: [facility] TTS Assessment: In system Is this a Tele or Face-to-Face Assessment?: Face-to-Face Is this an Initial Assessment or a Re-assessment for this encounter?: Initial Assessment Marital status: Long term relationship Is patient pregnant?: No Pregnancy Status: No Living Arrangements: Parent, Other relatives (Lives with mother and brother) Can pt return to current living arrangement?: Yes Admission Status: Involuntary Is patient capable of signing voluntary admission?: Yes Referral Source: Self/Family/Friend (Pt called [facility] Insurance type: MCD/MCR     Crisis Care Plan Living Arrangements: Parent, Other relatives (Lives with mother and brother) Name of Psychiatrist: Vesta Mixer Name of Therapist: [facility]  Education Status Is patient currently in school?: No Highest grade  of school patient has completed: 12th grade  Risk to self with the past 6 months Suicidal Ideation: Yes-Currently Present Has patient been a risk to self within the past 6 months prior to admission? : Yes Suicidal Intent: Yes-Currently Present Has patient had any suicidal intent within the past 6 months prior to admission? : Yes Is patient at risk for suicide?: Yes Suicidal Plan?: Yes-Currently Present Has patient  had any suicidal plan within the past 6 months prior to admission? : Yes Specify Current Suicidal Plan: Attempted OD on five Perocet Access to Means: Yes Specify Access to Suicidal Means: Pain medication. What has been your use of drugs/alcohol within the last 12 months?: Marijuana use Previous Attempts/Gestures: No How many times?: 0 Other Self Harm Risks: None Triggers for Past Attempts: None known Intentional Self Injurious Behavior: None Family Suicide History: No Recent stressful life event(s): Turmoil (Comment) (Pt has severe depression.  "I overthink things>") Persecutory voices/beliefs?: No Depression: Yes Depression Symptoms: Despondent, Insomnia, Loss of interest in usual pleasures, Feeling worthless/self pity, Isolating Substance abuse history and/or treatment for substance abuse?: No Suicide prevention information given to non-admitted patients: Not applicable  Risk to Others within the past 6 months Homicidal Ideation: No Does patient have any lifetime risk of violence toward others beyond the six months prior to admission? : No Thoughts of Harm to Others: No Current Homicidal Intent: No Current Homicidal Plan: No Access to Homicidal Means: No Identified Victim: No one History of harm to others?: No Assessment of Violence: None Noted Violent Behavior Description: None reported Does patient have access to weapons?: No Criminal Charges Pending?: No Does patient have a court date: No Is patient on probation?: No  Psychosis Hallucinations: Auditory, Visual (Sometimes hears voices.  Sometimes sees black hooded figure) Delusions: None noted  Mental Status Report Appearance/Hygiene: In scrubs (Dreds with green tips) Eye Contact: Poor Motor Activity: Freedom of movement, Unremarkable Speech: Logical/coherent Level of Consciousness: Alert Mood: Depressed, Anxious, Helpless, Sad Affect: Anxious, Sad Anxiety Level: Panic Attacks Panic attack frequency: 3x/week Most  recent panic attack: Today Thought Processes: Coherent, Relevant Judgement: Unimpaired Orientation: Person, Place, Situation, Time Obsessive Compulsive Thoughts/Behaviors: None  Cognitive Functioning Concentration: Normal Memory: Remote Intact, Recent Intact IQ: Average Insight: Good Impulse Control: Poor Appetite: Poor Weight Loss:  (Eating once per day or not at all.) Weight Gain: 0 Sleep: Decreased Total Hours of Sleep:  (5-6 hours per day) Vegetative Symptoms: None  ADLScreening [facility] Assessment Services) Patient's cognitive ability adequate to safely complete daily activities?: Yes Patient able to express need for assistance with ADLs?: Yes Independently performs ADLs?: Yes (appropriate for developmental age)  Prior Inpatient Therapy Prior Inpatient Therapy: No Prior Therapy Dates: N/A Prior Therapy Facilty/Provider(s): N/A Reason for Treatment: N/A  Prior Outpatient Therapy Prior Outpatient Therapy: Yes Prior Therapy Dates: One month Prior Therapy Facilty/Provider(s): [facility] Reason for Treatment: counseling and med management Does patient have an ACCT team?: No Does patient have Intensive In-House Services?  : No Does patient have [facility] services? : Yes Does patient have P4CC services?: No  ADL Screening (condition at time of admission) Patient's cognitive ability adequate to safely complete daily activities?: Yes Is the patient deaf or have difficulty hearing?: No Does the patient have difficulty seeing, even when wearing glasses/contacts?: No Does the patient have difficulty concentrating, remembering, or making decisions?: No Patient able to express need for assistance with ADLs?: Yes Does the patient have difficulty dressing or bathing?: No Independently performs ADLs?: Yes (appropriate for developmental age) Does  the patient have difficulty walking or climbing stairs?: No Weakness of Legs: None Weakness of Arms/Hands: None       Abuse/Neglect  Assessment (Assessment to be complete while patient is alone) Physical Abuse: Denies Verbal Abuse: Denies Sexual Abuse: Denies Exploitation of patient/patient's resources: Denies Self-Neglect: Denies     Merchant navy officer (For Healthcare) Does Patient Have a Medical Advance Directive?: No Would patient like information on creating a medical advance directive?: No - Patient declined    Additional Information 1:1 In Past 12 Months?: No CIRT Risk: No Elopement Risk: No Does patient have medical clearance?: Yes     Disposition:  Disposition Initial Assessment Completed for this Encounter: Yes Disposition of Patient: Other dispositions Other disposition(s): Other (Comment) (To be reviewed with PA)  On Site Evaluation by:   Reviewed with Physician:    Beatriz Stallion Ray 02/14/2017 6:08 AM

## 2017-02-14 NOTE — ED Notes (Signed)
Bed: [facility] Expected date:  Expected time:  Means of arrival:  Comments: 

## 2017-02-14 NOTE — ED Triage Notes (Addendum)
Pt comes via sheriff's office after being IVC'd after patient called suicide hot line stating he was suicidal. Pt attempted suicide by ingesting 5 oxycodone around 2 hours ago.  Pt does have sickle cell anemia and was saturating at 86% on RA. Placed on 2 L [place].  Denies any sickle cell related pain. Ambulatory.  A&O x4. Does not seem to be in any acute distress at this time. Reports hx of suicide attempts.

## 2017-02-14 NOTE — ED Provider Notes (Signed)
[facility] Provider Note   CSN: [id number] Arrival date & time: 02/14/17  0119     History   Chief Complaint Chief Complaint  Patient presents with  . IVC  . Drug Overdose  . Suicidal    HPI Nicolas Rogers is a 21 y.o. male.  Patient with a history of sickle cell anemia, schizophrenia presents with suicidal gesture. He took #5 Percocets around 11:30 02/13/17 (2 hours prior to arrival). He called the suicide hotline and arrived here under IVC. He has a history of suicidal ideation and attempt. No HI. He denies sickle cell related pain and is usually well controlled. He denies pain, vomiting, SOB or chest pain. No history of PE.    The history is provided by the patient. No language interpreter was used.  Drug Overdose  Pertinent negatives include no chest pain, no abdominal pain and no shortness of breath.    Past Medical History:  Diagnosis Date  . Cholecystolithiasis   . Schizophrenia (HCC)   . Sickle cell anemia [facility])     Patient Active Problem List   Diagnosis Date Noted  . Anxiety, generalized 09/01/2016  . Visual hallucinations 09/01/2016  . Auditory hallucinations 09/01/2016  . Hb-SS disease with acute chest syndrome (HCC) 08/29/2016  . CAP (community acquired pneumonia) 08/29/2016  . Hb-SS disease with crisis (HCC) 08/23/2016  . Vitamin D deficiency 03/12/2014  . Need for immunization against influenza 03/12/2014  . 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 ADENOIDECTOMY    . TONSILLECTOMY         Home Medications    Prior to Admission medications   Medication Sig Start Date End Date Taking? Authorizing Provider  oxyCODONE-acetaminophen (PERCOCET/ROXICET) 5-325 MG tablet Take 1 tablet by mouth every 6 (six) hours as needed for severe pain. 08/23/16  Yes Massie Maroon, FNP  folic acid (FOLVITE) 1 MG tablet Take 1 tablet (1 mg total) by mouth every morning. Patient not taking:  Reported on 02/14/2017 04/29/16   Massie Maroon, FNP  hydroxyurea (HYDREA) 500 MG capsule Take 2 capsules (1,000 mg total) by mouth every morning. May take with food to minimize GI side effects. Patient not taking: Reported on 02/14/2017 04/29/16   Massie Maroon, FNP  ibuprofen (ADVIL,MOTRIN) 600 MG tablet Take 1 tablet (600 mg total) by mouth every 8 (eight) hours as needed. Patient not taking: Reported on 12/21/2016 08/23/16   Massie Maroon, FNP  L-glutamine (ENDARI) 5 g PACK Powder Packet Take 10 g by mouth 2 (two) times daily. Patient not taking: Reported on 12/21/2016 09/07/16   Massie Maroon, FNP  PARoxetine (PAXIL) 20 MG tablet Take 1 tablet (20 mg total) by mouth daily. Patient not taking: Reported on 12/21/2016 09/07/16   Massie Maroon, FNP    Family History No family history on file.  Social History Social History  Substance Use Topics  . Smoking status: Never Smoker  . Smokeless tobacco: Never Used  . Alcohol use No     Allergies   Patient has no known allergies.   Review of Systems Review of Systems  Constitutional: Negative for chills and fever.  HENT: Negative.   Respiratory: Negative.  Negative for shortness of breath.   Cardiovascular: Negative.  Negative for chest pain.  Gastrointestinal: Positive for nausea. Negative for abdominal pain and vomiting.  Musculoskeletal: Negative.   Skin: Negative.   Neurological: Negative.   Psychiatric/Behavioral: Positive for self-injury and  suicidal ideas.     Physical Exam Updated Vital Signs BP 117/82 (BP Location: Right Arm)   Pulse 67   Temp 98.6 F (37 C) (Oral)   Resp 18   SpO2 (!) 86% Comment: pt placed on 2L nasal cannula. o2 increased to 95%  Physical Exam  Constitutional: He is oriented to person, place, and time. He appears well-developed and well-nourished.  HENT:  Head: Normocephalic.  Neck: Normal range of motion. Neck supple.  Cardiovascular: Normal rate and regular rhythm.   No murmur  heard. Pulmonary/Chest: Effort normal and breath sounds normal. He has no wheezes. He has no rales. He exhibits no tenderness.  Abdominal: Soft. Bowel sounds are normal. There is no tenderness. There is no rebound and no guarding.  Musculoskeletal: Normal range of motion.  Neurological: He is alert and oriented to person, place, and time.  Skin: Skin is warm and dry. No rash noted.  Psychiatric: He has a normal mood and affect.     ED Treatments / Results  Labs (all labs ordered are listed, but only abnormal results are displayed) Labs Reviewed  COMPREHENSIVE METABOLIC PANEL - Abnormal; Notable for the following:       Result Value   Glucose, Bld 109 (*)    AST 48 (*)    ALT 15 (*)    Total Bilirubin 4.5 (*)    All other components within normal limits  CBC - Abnormal; Notable for the following:    WBC 13.7 (*)    RBC 2.81 (*)    Hemoglobin 7.7 (*)    HCT 22.1 (*)    RDW 30.1 (*)    Platelets 418 (*)    All other components within normal limits  ETHANOL  SALICYLATE LEVEL  ACETAMINOPHEN LEVEL  RAPID URINE DRUG SCREEN, HOSP PERFORMED    EKG  EKG Interpretation  Date/Time:  Tuesday February 14 2017 02:10:13 EDT Ventricular Rate:  66 PR Interval:    QRS Duration: 98 QT Interval:  413 QTC Calculation: 433 R Axis:   69 Text Interpretation:  Sinus rhythm Normal ECG When compared with ECG of 08/22/2016, Nonspecific T wave abnormality is no longer present Confirmed by Dione Booze ([id number]) on 02/14/2017 2:14:55 AM       Radiology Dg Chest 2 View  Result Date: 02/14/2017 CLINICAL DATA:  Dyspnea for several hours. EXAM: CHEST  2 VIEW COMPARISON:  08/28/2016 FINDINGS: The lungs are clear. The pulmonary vasculature is normal. Heart size is normal. Hilar and mediastinal contours are unremarkable. There is no pleural effusion. IMPRESSION: No active cardiopulmonary disease. Electronically Signed   By: Ellery Plunk M.D.   On: 02/14/2017 03:55    Procedures Procedures  (including critical care time)  Medications Ordered in ED Medications - No data to display   Initial Impression / Assessment and Plan / ED Course  I have reviewed the triage vital signs and the nursing notes.  Pertinent labs & imaging results that were available during my care of the patient were reviewed by me and considered in my medical decision making (see chart for details).     Patient here after intentional ingestion of subtoxic amount of Percocet in an attempt to overdose with SI.   He arrives with O2 sat of 86%. No CP, SOB, cough, fever. CXR is negative. On recheck his O2 sat off oxygen is 94-96%. Will monitor.   O2 saturation found to be 88-89%, including when ambulating. No SOB, chest pain. Patient reports he knows his normal O2 saturation  to be "upper 80's.". CXR is negative. No history of PE, asymptomatic, no tachycardia.   He is felt to be medically cleared, however, 4-hour level of tylenol is pending.  Final Clinical Impressions(s) / ED Diagnoses   Final diagnoses:  None   1. Suicidal gesture  New Prescriptions New Prescriptions   No medications on file     Elpidio Anis, Cordelia Poche 02/14/17 0445    Dione Booze, MD 02/14/17 [phone]

## 2017-02-14 NOTE — ED Notes (Signed)
Bed: [id number] Expected date:  Expected time:  Means of arrival:  Comments: Hold for triage 4 

## 2017-02-14 NOTE — Progress Notes (Signed)
Pt did not attend AA group this evening.  

## 2017-02-14 NOTE — Progress Notes (Signed)
Nicolas Rogers is a 21 year old male being admitted involuntarily to [facility] from [facility].  He was IVC'd by mobile crisis for suicide attempt by taking 5 percocet tablets.  He has history of suicide attempts in the past.  He denies HI but does admit to hearing internal voices telling him bad things.  He has medical history of sickle cell disease.  During [facility] admission, he denies SI/HI or A/V hallucinations.  Oriented him to the unit.  Admission paperwork completed and signed.  Belongings searched and secured in locker # 41.  Skin assessment completed and no skin issues noted.  Q 15 minute checks initiated for safety.  We will monitor the progress towards his goals.

## 2017-02-14 NOTE — Progress Notes (Deleted)
02/14/17 1349:  LRT went to pt room, introduced self and offered activities.  Pt stated she may be interested in playing checkers.  At around 1415, pt came to nurses station and stated she was ready to play checkers.  LRT and pt went to dayroom and played two games of checkers.  Pt was pleasant and able to focus on the activity.   Alexa Blish, LRT/CTRS    

## 2017-02-15 DIAGNOSIS — R450 Nervousness: Secondary | ICD-10-CM

## 2017-02-15 DIAGNOSIS — R4587 Impulsiveness: Secondary | ICD-10-CM

## 2017-02-15 DIAGNOSIS — T391X2A Poisoning by 4-Aminophenol derivatives, intentional self-harm, initial encounter: Secondary | ICD-10-CM

## 2017-02-15 DIAGNOSIS — F332 Major depressive disorder, recurrent severe without psychotic features: Principal | ICD-10-CM

## 2017-02-15 DIAGNOSIS — G4700 Insomnia, unspecified: Secondary | ICD-10-CM

## 2017-02-15 DIAGNOSIS — F1910 Other psychoactive substance abuse, uncomplicated: Secondary | ICD-10-CM

## 2017-02-15 DIAGNOSIS — R5381 Other malaise: Secondary | ICD-10-CM

## 2017-02-15 DIAGNOSIS — R5383 Other fatigue: Secondary | ICD-10-CM

## 2017-02-15 DIAGNOSIS — R4582 Worries: Secondary | ICD-10-CM

## 2017-02-15 DIAGNOSIS — D5701 Hb-SS disease with acute chest syndrome: Secondary | ICD-10-CM

## 2017-02-15 DIAGNOSIS — F4010 Social phobia, unspecified: Secondary | ICD-10-CM

## 2017-02-15 DIAGNOSIS — T1491XA Suicide attempt, initial encounter: Secondary | ICD-10-CM

## 2017-02-15 DIAGNOSIS — F419 Anxiety disorder, unspecified: Secondary | ICD-10-CM

## 2017-02-15 MED ORDER — SERTRALINE HCL 25 MG PO TABS
25.0000 mg | ORAL_TABLET | Freq: Every day | ORAL | Status: DC
  Administered 2017-02-15 – 2017-02-20 (×6): 25 mg via ORAL
  Filled 2017-02-15 (×9): qty 1

## 2017-02-15 NOTE — Tx Team (Signed)
Interdisciplinary Treatment and Diagnostic Plan Update  02/15/2017 Time of Session: 0830AM Nicolas Rogers MRN: [id number]  Principal Diagnosis: Schizophrenia  Secondary Diagnoses: Active Problems:   MDD (major depressive episode), single episode, severe, no psychosis (HCC)   Current Medications:  Current Facility-Administered Medications  Medication Dose Route Frequency Provider Last Rate Last Dose  . acetaminophen (TYLENOL) tablet 650 mg  650 mg Oral Q6H PRN Laveda Abbe, NP      . alum & mag hydroxide-simeth (MAALOX/MYLANTA) 200-200-20 MG/5ML suspension 30 mL  30 mL Oral Q4H PRN Laveda Abbe, NP      . hydrOXYzine (ATARAX/VISTARIL) tablet 25 mg  25 mg Oral TID PRN Laveda Abbe, NP      . Influenza vac split quadrivalent PF (FLUARIX) injection 0.5 mL  0.5 mL Intramuscular Tomorrow-1000 Cobos, Fernando A, MD      . magnesium hydroxide (MILK OF MAGNESIA) suspension 30 mL  30 mL Oral Daily PRN Laveda Abbe, NP      . traZODone (DESYREL) tablet 50 mg  50 mg Oral QHS PRN Laveda Abbe, NP       PTA Medications: Prescriptions Prior to Admission  Medication Sig Dispense Refill Last Dose  . folic acid (FOLVITE) 1 MG tablet Take 1 tablet (1 mg total) by mouth every morning. (Patient not taking: Reported on 02/14/2017) 30 tablet 11 Not Taking at Unknown time  . hydroxyurea (HYDREA) 500 MG capsule Take 2 capsules (1,000 mg total) by mouth every morning. May take with food to minimize GI side effects. (Patient not taking: Reported on 02/14/2017) 60 capsule 5 Not Taking at Unknown time  . ibuprofen (ADVIL,MOTRIN) 600 MG tablet Take 1 tablet (600 mg total) by mouth every 8 (eight) hours as needed. (Patient not taking: Reported on 12/21/2016) 30 tablet 2 Completed Course at Unknown time  . L-glutamine (ENDARI) 5 g PACK Powder Packet Take 10 g by mouth 2 (two) times daily. (Patient not taking: Reported on 12/21/2016) 120 packet 5 Not Taking at Unknown time  .  oxyCODONE-acetaminophen (PERCOCET/ROXICET) 5-325 MG tablet Take 1 tablet by mouth every 6 (six) hours as needed for severe pain. 30 tablet 0 02/14/2017 at Unknown time  . PARoxetine (PAXIL) 20 MG tablet Take 1 tablet (20 mg total) by mouth daily. (Patient not taking: Reported on 12/21/2016) 30 tablet 1 Not Taking at Unknown time    Patient Stressors: Financial difficulties Marital or family conflict Substance abuse  Patient Strengths: Wellsite geologist fund of knowledge Supportive family/friends  Treatment Modalities: Medication Management, Group therapy, Case management,  1 to 1 session with clinician, Psychoeducation, Recreational therapy.   Physician Treatment Plan for Primary Diagnosis: Schizophrenia    Medication Management: Evaluate patient's response, side effects, and tolerance of medication regimen.  Therapeutic Interventions: 1 to 1 sessions, Unit Group sessions and Medication administration.  Evaluation of Outcomes: Progressing  Physician Treatment Plan for Secondary Diagnosis: Active Problems:   MDD (major depressive episode), single episode, severe, no psychosis (HCC)   Medication Management: Evaluate patient's response, side effects, and tolerance of medication regimen.  Therapeutic Interventions: 1 to 1 sessions, Unit Group sessions and Medication administration.  Evaluation of Outcomes: Progressing   RN Treatment Plan for Primary Diagnosis: Schizophrenia Long Term Goal(s): Knowledge of disease and therapeutic regimen to maintain health will improve  Short Term Goals: Ability to remain free from injury will improve, Ability to demonstrate self-control and Ability to disclose and discuss suicidal ideas  Medication Management: RN will administer medications as ordered by  provider, will assess and evaluate patient's response and provide education to patient for prescribed medication. RN will report any adverse and/or side effects to prescribing  provider.  Therapeutic Interventions: 1 on 1 counseling sessions, Psychoeducation, Medication administration, Evaluate responses to treatment, Monitor vital signs and CBGs as ordered, Perform/monitor CIWA, COWS, AIMS and Fall Risk screenings as ordered, Perform wound care treatments as ordered.  Evaluation of Outcomes: Progressing   LCSW Treatment Plan for Primary Diagnosis:Schizophrenia Long Term Goal(s): Safe transition to appropriate next level of care at discharge, Engage patient in therapeutic group addressing interpersonal concerns.  Short Term Goals: Engage patient in aftercare planning with referrals and resources, Facilitate patient progression through stages of change regarding substance use diagnoses and concerns and Identify triggers associated with mental health/substance abuse issues  Therapeutic Interventions: Assess for all discharge needs, 1 to 1 time with Social worker, Explore available resources and support systems, Assess for adequacy in community support network, Educate family and significant other(s) on suicide prevention, Complete Psychosocial Assessment, Interpersonal group therapy.  Evaluation of Outcomes: Progressing   Progress in Treatment: Attending groups: No. New to unit. Continuing to assess.  Participating in groups: No. Taking medication as prescribed: Yes. Toleration medication: Yes. Family/Significant other contact made: No, will contact:  family member if patient consents Patient understands diagnosis: Yes. Discussing patient identified problems/goals with staff: Yes. Medical problems stabilized or resolved: Yes. Denies suicidal/homicidal ideation: Yes. Issues/concerns per patient self-inventory: No. Other: n/a   New problem(s) identified: No, Describe:  n/a   New Short Term/Long Term Goal(s): medication management for mood stabilization; elimination of SI thoughts and AVH.  Patient Goal: "I want to stop hearing voices and seeing things."    Discharge Plan or Barriers: CSW assessing. [facility] and counseling. [facility] pamphlet provided for additional community support  Reason for Continuation of Hospitalization: Anxiety Depression Hallucinations Medication stabilization Suicidal ideation  Estimated Length of Stay: Monday 02/20/17  Attendees: Patient: 02/15/2017 8:19 AM  Physician: Dr. Jama Flavors MD; Dr. Jackquline Berlin MD 02/15/2017 8:19 AM  Nursing: Rebecca Eaton RN 02/15/2017 8:19 AM  RN Care Manager: Onnie Boer CM 02/15/2017 8:19 AM  Social Worker: Trula Slade, LCSW 02/15/2017 8:19 AM  Recreational Therapist: x 02/15/2017 8:19 AM  Other: Hillery Jacks NP; Feliz Beam Money NP 02/15/2017 8:19 AM  Other:  02/15/2017 8:19 AM  Other: 02/15/2017 8:19 AM    Scribe for Treatment Team: Ledell Peoples Smart, LCSW 02/15/2017 8:19 AM

## 2017-02-15 NOTE — Progress Notes (Signed)
Recreation Therapy Notes  Date: 02/15/17 Time: 0930 Location: [address] Dayroom  Group Topic: Stress Management  Goal Area(s) Addresses:  Patient will verbalize importance of using healthy stress management.  Patient will identify positive emotions associated with healthy stress management.   Behavioral Response: Engaged  Intervention: Stress Management  Activity :  Guided Imagery.  LRT introduced the stress management technique of guided imagery.  LRT read a script leading patients on a journey floating on a cloud.  Patients were to follow a long as LRT read script to engage in the activity.  Education:  Stress Management, Discharge Planning.   Education Outcome: Acknowledges edcuation/In group clarification offered/Needs additional education  Clinical Observations/Feedback: Pt attended group.   Chenee Munns, LRT/CTRS         Kalana Yust A 02/15/2017 11:32 AM 

## 2017-02-15 NOTE — BHH Counselor (Signed)
Adult Comprehensive Assessment  Patient ID: Nicolas Rogers, male   DOB: 10-Nov-1995, 21 y.o.   MRN: [id number]  Information Source: Information source: Patient  Current Stressors:  Educational / Learning stressors: high school and some community college. plans to readmit in the next year Employment / Job issues: employed x2 weeks at [employer] Family Relationships: close to mother and brother Surveyor, quantity / Lack of resources (include bankruptcy): income from employment; disability income (Sickle Cell Anemia), medicaid, and assistance from his mother Housing / Lack of housing: lives with his mother and 87 yo brother Physical health (include injuries & life threatening diseases): sickle cell anemia and some chronic pain issues due to this Social relationships: some friends in community; strong family support Substance abuse: marijuana only. Bereavement / Loss: none identified.  Living/Environment/Situation:  Living Arrangements: Parent, Other relatives Living conditions (as described by patient or guardian): lives with his mother and 18yo brother How long has patient lived in current situation?: "my whole life."  What is atmosphere in current home: Comfortable, Paramedic, Supportive  Family History:  Marital status: Long term relationship Long term relationship, how long?: 3 months What types of issues is patient dealing with in the relationship?: "I overthink things and recently it made me suicidal." pt vague about relationship.  Additional relationship information: n/a  Are you sexually active?: Yes What is your sexual orientation?: heterosexual Has your sexual activity been affected by drugs, alcohol, medication, or emotional stress?: n/a  Does patient have children?: No  Childhood History:  By whom was/is the patient raised?: Both parents Additional childhood history information: both parents raised patient. "They were never married and split when I was a  kids." "I had a pretty good childhood." no parental s/a or mental illness.  Description of patient's relationship with caregiver when they were a child: close to both parents Patient's description of current relationship with people who raised him/her: lives with and is close to his mother; some contact with his father.  How were you disciplined when you got in trouble as a child/adolescent?: yelled at.  Does patient have siblings?: Yes Number of Siblings: 1 Description of patient's current relationship with siblings: 29 yo brother. 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 How long has patient been employed?: 2 weeks  Patient's job has been impacted by current illness: No What is the longest time patient has a held a job?: few months Where was the patient employed at that time?: n/a  Has patient ever been in the [employer]?: No Has patient ever served in combat?: No Did You Receive Any Psychiatric Treatment/Services While in Equities trader?: No Are There Guns or Other Weapons in Your Home?: No Are These Comptroller?:  (n/a)  Financial Resources:   Financial resources: Income from employment, Medicare, Medicaid, Support from parents / caregiver Does patient have a representative payee or guardian?: No  Alcohol/Substance Abuse:   What has been your use of drugs/alcohol within the last 12 months?: intermittent marijuana use. no drug  use--prescribed pain  medication due to sickle cell anemia.  If attempted suicide, did drugs/alcohol play a role in this?: Yes (suicide attempt where he took 5 percoset "and fell asleep." ) Alcohol/Substance Abuse Treatment Hx: Denies past history, Past Tx, Outpatient If yes, describe treatment: [facility] for o/p  Has alcohol/substance abuse ever caused legal problems?: No  Social Support System:   Forensic psychologist System: Fair Museum/gallery exhibitions officer System: family and friends (mom and brother) Type of faith/religion: christian How does patient's faith help to cope with current illness?: prayer; church  Leisure/Recreation:   Leisure and Hobbies: playing vidoe gam  Strengths/Needs:   What things does the patient do well?: intelligent; motivated to get help for depression and anxiety In what areas does patient struggle / problems for patient: coping skills; ruminating thoughts; AH  Discharge Plan:   Does patient have access to transportation?: Yes (car and license) Will patient be returning to same living situation after discharge?: Yes (home with mother and brother) Currently receiving community mental health services: Yes (From Whom) [facility]) If no, would patient like referral for services when discharged?: Yes (What county?) [facility] sales [facility]) Does patient have financial barriers related to discharge medications?: No  Summary/Recommendations:   Summary and Recommendations (to be completed by the evaluator): Patient is 21yo male IVCed after calling the suicide hotline and taking percoset pills in overdose attempt. Patient is a resident of [address] and lives with his mother and brother. He has a diagnosis of MDD and sickle cell anemia. Patient reports recereational marijuana use. He reports increased depression/anxiety/ruminating thoughts that led to SI. He is employed and also in a relationship. He plans to return home at discharge and resume services at [facility] for outpatient  medication management and therapy. Recommendations for patient include: crisis stabilization, therapeutic milieu, encourage group attendance and participation, and development of comprehensive mental wellness/sobriety plan. CSW assessing for appropriate referrals.   Ledell Peoples Smart LCSW 02/15/2017 10:47 AM

## 2017-02-15 NOTE — Progress Notes (Signed)
Pt attended NA group this evening.  

## 2017-02-15 NOTE — BHH Suicide Risk Assessment (Signed)
[facility] Admission Suicide Risk Assessment   Nursing information obtained from:  Patient Demographic factors:  Male, Adolescent or young adult Current Mental Status:  NA Loss Factors:  Financial problems / change in socioeconomic status, Decline in physical health Historical Factors:  Prior suicide attempts, Family history of mental illness or substance abuse Risk Reduction Factors:  Living with another person, especially a relative  Total Time spent with patient: 30 minutes Principal Problem: MDD (major depressive disorder), recurrent severe, without psychosis (HCC) Diagnosis:   Patient Active Problem List   Diagnosis Date Noted  . MDD (major depressive disorder), recurrent severe, without psychosis (HCC) [F33.2] 02/15/2017  . Major depressive disorder, single episode with psychotic features (HCC) [F32.3] 02/14/2017  . MDD (major depressive episode), single episode, severe, no psychosis (HCC) [F32.2] 02/14/2017  . Anxiety, generalized [F41.1] 09/01/2016  . Visual hallucinations [R44.1] 09/01/2016  . Auditory hallucinations [R44.0] 09/01/2016  . Hb-SS disease with acute chest syndrome (HCC) [D57.01] 08/29/2016  . CAP (community acquired pneumonia) [J18.9] 08/29/2016  . Hb-SS disease with crisis (HCC) [D57.00] 08/23/2016  . Vitamin D deficiency [E55.9] 03/12/2014  . Need for immunization against influenza [Z23] 03/12/2014  . Hb-SS disease without crisis [facility]) [D57.1] 01/28/2013   Subjective Data:  21 y.o  AAM single, lives with his mother. Works as a Special educational needs teacher. Background history of Mood disorder, SUD and SCD Presented to the ER via the police. Had called suicide hotline himself and expressed suicidal ideation. Overdosed on five pills of percocet before calling suicide hotline. Sent goodbye messages electronically to friends and family memebers . Routine labs show impaired hepatic and blood counts in keeping with chronic hemolytic disease.  UDS negative though he admits to regular use of  THC.Negative for alcohol.  Patient reports feeling down after he saw some posts about his girlfriend. Says he felt she was cheating on him. Says he felt down and overdosed on his percocet. At that time just wanted to sleep and never wake up. Says when he woke up, he called the crisis hotline hoping he could speak to someone that would help him feel better. Says he was surprised they called the police. Says while here he talked to his girlfriend. Says she clarified things and he is convinced she was not having an affair. Patient says they are back together again. He is not feeling as down as he was when he overdosed. Denies any lingering suicidal thoughts. Says he has had suicidal thoughts off and on over the years. Has thought about jumping off a height. Has thought about slitting his wrist. Has never really attempted until recently. No thoughts of harming anyone else. No thoughts of violence. Reports long history of nightmares. Sees shadows when getting into sleep. Hears a voice in his head that criticizes him. Patient says he has always used THC to cope. No use of synthetic type. No use of alcohol or any other substances. No issues at work. No legal issues. Says his family is supportive. No other stressors.  He has been tried on paroxetine for depression by his PCP. Did not tolerate it because of sedation. Has not used any other psychotropic medication in the past. No past psychiatric admission.  We discussed use of Sertraline to manage his anxiety and depression. Patient consented to treatment after we reviewed the risks and benefits. Role of THC in perpetuating this and distorting reality was discussed with him. He does not have any motivation to quit using it.   Continued Clinical Symptoms:  Alcohol Use  Disorder Identification Test Final Score (AUDIT): 0 The "Alcohol Use Disorders Identification Test", Guidelines for Use in Primary Care, Second Edition.  World Science writer [facility]). Score between  0-7:  no or low risk or alcohol related problems. Score between 8-15:  moderate risk of alcohol related problems. Score between 16-19:  high risk of alcohol related problems. Score 20 or above:  warrants further diagnostic evaluation for alcohol dependence and treatment.   CLINICAL FACTORS:   Depression:   Impulsivity Alcohol/Substance Abuse/Dependencies   Musculoskeletal: Strength & Muscle Tone: within normal limits Gait & Station: normal Patient leans: N/A  Psychiatric Specialty Exam: Physical Exam  Constitutional: He is oriented to person, place, and time. No distress.  HENT:  Head: Normocephalic and atraumatic.  Respiratory: Effort normal.  Neurological: He is alert and oriented to person, place, and time.  Skin: He is not diaphoretic.  Psychiatric:  As above    ROS  Blood pressure 112/79, pulse (!) 109, temperature 98.8 F (37.1 C), temperature source Oral, resp. rate 16, height 6' (1.829 m), weight 64.9 kg (143 lb), SpO2 100 %.Body mass index is 19.39 kg/m.  General Appearance: Long braided hair, poor personal hygiene. Calm and cooperative. Slightly withdrawn. No odd mannerism. Not internally distracted   Eye Contact:  Good  Speech:  Soft  Volume:  Normal  Mood:  Worried and depressed  Affect:  Restricted  Thought Process:  Linear  Orientation:  Full (Time, Place, and Person)  Thought Content:  Rumination  Suicidal Thoughts:  None currently  Homicidal Thoughts:  No  Memory:  Immediate;   Good Recent;   Good Remote;   Good  Judgement:  Fair  Insight:  Fair  Psychomotor Activity:  Decreased  Concentration:  Concentration: Fair and Attention Span: Fair  Recall:  Good  Fund of Knowledge:  Good  Language:  Good  Akathisia:  Negative  Handed:    AIMS (if indicated):     Assets:  Communication Skills Desire for Improvement Housing Intimacy Resilience Social Support Talents/Skills Vocational/Educational  ADL's:  Fair  Cognition:  WNL  Sleep:  Number of  Hours: 6.75      COGNITIVE FEATURES THAT CONTRIBUTE TO RISK:  None    SUICIDE RISK:   Minimal: No identifiable suicidal ideation.  Patients presenting with no risk factors but with morbid ruminations; may be classified as minimal risk based on the severity of the depressive symptoms  PLAN OF CARE:  1. Suicide precautions 2. Sertraline 25 mg daily 3. Collateral from his family 4. Monitor mood, behavior and interaction with peers 5. Encourage unit groups and therapeutic activities.   I certify that inpatient services furnished can reasonably be expected to improve the patient's condition.   Georgiann Cocker, MD 02/15/2017, 3:30 PM

## 2017-02-15 NOTE — H&P (Signed)
Psychiatric Admission Assessment Adult  Patient Identification: Nicolas Rogers  MRN:  [id number]  Date of Evaluation:  02/15/2017  Chief Complaint: Suicide attempt by overdose on Percocet tablets.  Principal Diagnosis: Major depressive disorder, recurrent episodes without psychosis.  Diagnosis:   Patient Active Problem List   Diagnosis Date Noted  . MDD (major depressive disorder), recurrent severe, without psychosis ([place]) [F33.2] 02/15/2017    Priority: High  . Major depressive disorder, single episode with psychotic features ([place]) [F32.3] 02/14/2017  . MDD (major depressive episode), single episode, severe, no psychosis ([place]) [F32.2] 02/14/2017  . Anxiety, generalized [F41.1] 09/01/2016  . Visual hallucinations [R44.1] 09/01/2016  . Auditory hallucinations [R44.0] 09/01/2016  . Hb-SS disease with acute chest syndrome ([place]) [D57.01] 08/29/2016  . CAP (community acquired pneumonia) [J18.9] 08/29/2016  . Hb-SS disease with crisis ([place]) [D57.00] 08/23/2016  . Vitamin D deficiency [E55.9] 03/12/2014  . Need for immunization against influenza [Z23] 03/12/2014  . Hb-SS disease without crisis [facility]) [D57.1] 01/28/2013   History of Present Illness: This is an admission assessment for this 21 year old AA male with history of Sickle Cell anemia. Admitted to the [facility] from the [facility] with complaints of suicide attempt by overdose on 5 tablets of Percocet. Chart review also indicated hx of prior attempt by jumping.   During this assessment, Nicolas Rogers reports, "The police took me to the [facility] ED yesterday. I had called the crisis-hotline because I was feeling very depressed & suicidal. After I called them, I took 5 tablets of my Percocet, went to bed to die. Few minutes later, the cops were knocking on the door. They took me to the ED. I have been having suicidal thoughts since last year. The depression worsened around 10:00 PM the night I took the overdose. My depression  started after I dropped out of college because of poor grade. I thought I did not have it in me to make in college. I have Sickle cell disease. I go the sickle cell clinic close by here. I told them about my depression & anxiety. They started me on Prozac. I took it for 3 months, could not handle the side effects (constant yawning, blinking & impotence). I stopped it. I was also seen at [facility] where I was diagnosed with Schizophrenia, depression, anxiety & Psychosis because I hear voices in my head telling me to kill myself. I had attempted to jump to my death once, that scared me & caused me to have PTSD".  Associated Signs/Symptoms:  Depression Symptoms:  depressed mood, insomnia, feelings of worthlessness/guilt, suicidal thoughts without plan, suicidal thoughts with specific plan, anxiety,  (Hypo) Manic Symptoms:  Impulsivity,  Anxiety Symptoms:  Excessive Worry, Social Anxiety,  Psychotic Symptoms:  Currently denies any psychotic symptoms, however, endorses hx of AVH in the past telling me to kill myself.  PTSD Symptoms: Had a traumatic exposure:  When I tried to jump to my death about a year ago.  Total Time spent with patient: 1 hour  Past Psychiatric History: Major depression & anxiety.  Is the patient at risk to self? No.  Has the patient been a risk to self in the past 6 months? Yes.    Has the patient been a risk to self within the distant past? Yes.    Is the patient a risk to others? No.  Has the patient been a risk to others in the past 6 months? No.  Has the patient been a risk to others within the distant  past? No.   Prior Inpatient Therapy: No  Prior Outpatient Therapy: Yes (Prozac).  Alcohol Screening: 1. How often do you have a drink containing alcohol?: Never 9. Have you or someone else been injured as a result of your drinking?: No 10. Has a relative or friend or a doctor or another health worker been concerned about your drinking or suggested you cut down?:  No Alcohol Use Disorder Identification Test Final Score (AUDIT): 0 Brief Intervention: AUDIT score less than 7 or less-screening does not suggest unhealthy drinking-brief intervention not indicated  Substance Abuse History in the last 12 months:  Yes.    Consequences of Substance Abuse: Medical Consequences:  Liver damage, Possible death by overdose Legal Consequences:  Arrests, jail time, Loss of driving privilege. Family Consequences:  Family discord, divorce and or separation.  Previous Psychotropic Medications: Yes   Psychological Evaluations: No   Past Medical History:  Past Medical History:  Diagnosis Date  . Cholecystolithiasis   . Schizophrenia ([place])   . Sickle cell anemia (HCC)     Past Surgical History:  Procedure Laterality Date  . ADENOIDECTOMY    . TONSILLECTOMY     Family History: History reviewed. No pertinent family history.  Family Psychiatric  History: Denies any familial history of mental illness.  Tobacco Screening: Have you used any form of tobacco in the last 30 days? (Cigarettes, Smokeless Tobacco, Cigars, and/or Pipes): No  Social History:  History  Alcohol Use No     History  Drug Use No    Additional Social History: Marital status: Long term relationship Long term relationship, how long?: 3 months What types of issues is patient dealing with in the relationship?: "I overthink things and recently it made me suicidal." pt vague about relationship.  Additional relationship information: n/a  Are you sexually active?: Yes What is your sexual orientation?: heterosexual Has your sexual activity been affected by drugs, alcohol, medication, or emotional stress?: n/a  Does patient have children?: No  Allergies:  No Known Allergies  Lab Results:  Results for orders placed or performed during the hospital encounter of 02/14/17 (from the past 48 hour(s))  Comprehensive metabolic panel     Status: Abnormal   Collection Time: 02/14/17  2:16 AM   Result Value Ref Range   Sodium 141 135 - 145 mmol/L   Potassium 4.3 3.5 - 5.1 mmol/L   Chloride 107 101 - 111 mmol/L   CO2 25 22 - 32 mmol/L   Glucose, Bld 109 (H) 65 - 99 mg/dL   BUN 6 6 - 20 mg/dL   Creatinine, Ser 0.67 0.61 - 1.24 mg/dL   Calcium 9.0 8.9 - 10.3 mg/dL   Total Protein 7.6 6.5 - 8.1 g/dL   Albumin 4.2 3.5 - 5.0 g/dL   AST 48 (H) 15 - 41 U/L   ALT 15 (L) 17 - 63 U/L   Alkaline Phosphatase 58 38 - 126 U/L   Total Bilirubin 4.5 (H) 0.3 - 1.2 mg/dL   GFR calc non Af Amer >60 >60 mL/min   GFR calc Af Amer >60 >60 mL/min    Comment: (NOTE) The eGFR has been calculated using the CKD EPI equation. This calculation has not been validated in all clinical situations. eGFR's persistently <60 mL/min signify possible Chronic Kidney Disease.    Anion gap 9 5 - 15  Ethanol     Status: None   Collection Time: 02/14/17  2:16 AM  Result Value Ref Range   Alcohol, Ethyl (B) <10 <  10 mg/dL    Comment:        LOWEST DETECTABLE LIMIT FOR SERUM ALCOHOL IS 10 mg/dL FOR MEDICAL PURPOSES ONLY Please note change in reference range.   Salicylate level     Status: None   Collection Time: 02/14/17  2:16 AM  Result Value Ref Range   Salicylate Lvl <1.6 2.8 - 30.0 mg/dL  Acetaminophen level     Status: None   Collection Time: 02/14/17  2:16 AM  Result Value Ref Range   Acetaminophen (Tylenol), Serum 13 10 - 30 ug/mL    Comment:        THERAPEUTIC CONCENTRATIONS VARY SIGNIFICANTLY. A RANGE OF 10-30 ug/mL MAY BE AN EFFECTIVE CONCENTRATION FOR MANY PATIENTS. HOWEVER, SOME ARE BEST TREATED AT CONCENTRATIONS OUTSIDE THIS RANGE. ACETAMINOPHEN CONCENTRATIONS >150 ug/mL AT 4 HOURS AFTER INGESTION AND >50 ug/mL AT 12 HOURS AFTER INGESTION ARE OFTEN ASSOCIATED WITH TOXIC REACTIONS.   cbc     Status: Abnormal   Collection Time: 02/14/17  2:16 AM  Result Value Ref Range   WBC 13.7 (H) 4.0 - 10.5 K/uL   RBC 2.81 (L) 4.22 - 5.81 MIL/uL   Hemoglobin 7.7 (L) 13.0 - 17.0 g/dL   HCT  22.1 (L) 39.0 - 52.0 %   MCV 78.6 78.0 - 100.0 fL   MCH 27.4 26.0 - 34.0 pg   MCHC 34.8 30.0 - 36.0 g/dL   RDW 30.1 (H) 11.5 - 15.5 %   Platelets 418 (H) 150 - 400 K/uL  Rapid urine drug screen (hospital performed)     Status: None   Collection Time: 02/14/17  2:39 AM  Result Value Ref Range   Opiates NONE DETECTED NONE DETECTED   Cocaine NONE DETECTED NONE DETECTED   Benzodiazepines NONE DETECTED NONE DETECTED   Amphetamines NONE DETECTED NONE DETECTED   Tetrahydrocannabinol NONE DETECTED NONE DETECTED   Barbiturates NONE DETECTED NONE DETECTED    Comment:        DRUG SCREEN FOR MEDICAL PURPOSES ONLY.  IF CONFIRMATION IS NEEDED FOR ANY PURPOSE, NOTIFY LAB WITHIN 5 DAYS.        LOWEST DETECTABLE LIMITS FOR URINE DRUG SCREEN Drug Class       Cutoff (ng/mL) Amphetamine      1000 Barbiturate      200 Benzodiazepine   109 Tricyclics       604 Opiates          300 Cocaine          300 THC              50    Blood Alcohol level:  Lab Results  Component Value Date   ETH <10 54/01/8118   Metabolic Disorder Labs:  No results found for: HGBA1C, MPG No results found for: PROLACTIN No results found for: CHOL, TRIG, HDL, CHOLHDL, VLDL, LDLCALC  Current Medications: Current Facility-Administered Medications  Medication Dose Route Frequency Provider Last Rate Last Dose  . acetaminophen (TYLENOL) tablet 650 mg  650 mg Oral Q6H PRN Ethelene Hal, NP      . alum & mag hydroxide-simeth (MAALOX/MYLANTA) 200-200-20 MG/5ML suspension 30 mL  30 mL Oral Q4H PRN Ethelene Hal, NP      . hydrOXYzine (ATARAX/VISTARIL) tablet 25 mg  25 mg Oral TID PRN Ethelene Hal, NP      . magnesium hydroxide (MILK OF MAGNESIA) suspension 30 mL  30 mL Oral Daily PRN Ethelene Hal, NP      . traZODone (DESYREL)  tablet 50 mg  50 mg Oral QHS PRN Ethelene Hal, NP       PTA Medications: Prescriptions Prior to Admission  Medication Sig Dispense Refill Last Dose  .  folic acid (FOLVITE) 1 MG tablet Take 1 tablet (1 mg total) by mouth every morning. (Patient not taking: Reported on 02/14/2017) 30 tablet 11 Not Taking at Unknown time  . hydroxyurea (HYDREA) 500 MG capsule Take 2 capsules (1,000 mg total) by mouth every morning. May take with food to minimize GI side effects. (Patient not taking: Reported on 02/14/2017) 60 capsule 5 Not Taking at Unknown time  . ibuprofen (ADVIL,MOTRIN) 600 MG tablet Take 1 tablet (600 mg total) by mouth every 8 (eight) hours as needed. (Patient not taking: Reported on 12/21/2016) 30 tablet 2 Completed Course at Unknown time  . L-glutamine (ENDARI) 5 g PACK Powder Packet Take 10 g by mouth 2 (two) times daily. (Patient not taking: Reported on 12/21/2016) 120 packet 5 Not Taking at Unknown time  . oxyCODONE-acetaminophen (PERCOCET/ROXICET) 5-325 MG tablet Take 1 tablet by mouth every 6 (six) hours as needed for severe pain. 30 tablet 0 02/14/2017 at Unknown time  . PARoxetine (PAXIL) 20 MG tablet Take 1 tablet (20 mg total) by mouth daily. (Patient not taking: Reported on 12/21/2016) 30 tablet 1 Not Taking at Unknown time   Musculoskeletal: Strength & Muscle Tone: within normal limits Gait & Station: normal Patient leans: N/A  Psychiatric Specialty Exam: Physical Exam  Constitutional: He appears well-developed.  HENT:  Head: Normocephalic.  Eyes: Pupils are equal, round, and reactive to light.  Neck: Normal range of motion.  Cardiovascular:  Elevated pulse rate  Respiratory: Effort normal.  GI: Soft.  Genitourinary:  Genitourinary Comments: Deferred  Musculoskeletal: Normal range of motion.  Neurological: He is alert.  Skin: Skin is warm.    Review of Systems  Constitutional: Positive for malaise/fatigue.  HENT: Negative.   Eyes: Negative.   Respiratory: Negative.   Cardiovascular: Negative.   Gastrointestinal: Negative.   Genitourinary: Negative.   Musculoskeletal: Negative.   Skin: Negative.   Neurological:  Negative.   Endo/Heme/Allergies: Negative.   Psychiatric/Behavioral: Positive for substance abuse and suicidal ideas. Negative for hallucinations and memory loss. The patient is nervous/anxious and has insomnia.     Blood pressure 112/79, pulse (!) 109, temperature 98.8 F (37.1 C), temperature source Oral, resp. rate 16, height 6' (1.829 m), weight 64.9 kg (143 lb), SpO2 100 %.Body mass index is 19.39 kg/m.  General Appearance: Disheveled  Eye Contact:  Good  Speech:  Clear and Coherent and Normal Rate  Volume:  Normal  Mood:  Anxious and Depressed  Affect:  Flat  Thought Process:  Coherent and Goal Directed  Orientation:  Full (Time, Place, and Person)  Thought Content:  Rumination  Suicidal Thoughts:  Currently denies any thoughts, plans or intent.  Homicidal Thoughts:  Denies  Memory:  Immediate;   Good Recent;   Good Remote;   Good  Judgement:  Fair  Insight:  Present  Psychomotor Activity:  Normal  Concentration:  Concentration: Good and Attention Span: Good  Recall:  Good  Fund of Knowledge:  Good  Language:  Good  Akathisia:  No  Handed:  Right  AIMS (if indicated):     Assets:  Communication Skills Desire for Improvement Social Support  ADL's:  Intact  Cognition:  WNL  Sleep:  Number of Hours: 6.75   Treatment Plan/Recommendations: 1. Admit for crisis management and stabilization, estimated length of  stay 3-5 days.   2. Medication management to reduce current symptoms to base line and improve the patient's overall level of functioning: See MAR, Md's SRA & Treatment plans.   3. Treat health problems as indicated.  4. Develop treatment plan to decrease risk of relapse upon discharge and the need for readmission.  5. Psycho-social education regarding relapse prevention and self care.  6. Health care follow up as needed for medical problems.  7. Review, reconcile, and reinstate any pertinent home medications for other health issues where appropriate. 8. Call for  consults with hospitalist for any additional specialty patient care services as needed.  Observation Level/Precautions:  15 minute checks  Laboratory:  Per ED  Psychotherapy: Group sessions    Medications: See [facility]   Consultations: As needed    Discharge Concerns: Safety  Estimated LOS: 2-4 days.  Other: Admit to [address].    Physician Treatment Plan for Primary Diagnosis: Will initiate medication management for mood stability. Set up an outpatient psychiatric services for medication management. Will encourage medication adherence with psychiatric medications.   Long Term Goal(s): Improvement in symptoms so as ready for discharge  Short Term Goals: Ability to identify changes in lifestyle to reduce recurrence of condition will improve, Ability to disclose and discuss suicidal ideas and Ability to demonstrate self-control will improve  Physician Treatment Plan for Secondary Diagnosis: Principal Problem:   MDD (major depressive disorder), recurrent severe, without psychosis ([place]) Active Problems:   MDD (major depressive episode), single episode, severe, no psychosis ([place])  Long Term Goal(s): Improvement in symptoms so as ready for discharge  Short Term Goals: Ability to identify and develop effective coping behaviors will improve and Compliance with prescribed medications will improve  I certify that inpatient services furnished can reasonably be expected to improve the patient's condition.    Encarnacion Slates, NP, PMHNP, FNP-BC. 10/10/20181:22 PM

## 2017-02-15 NOTE — [facility] Group Notes (Signed)
LCSW Group Therapy Note   02/15/2017 1:15pm   Type of Therapy and Topic:  Group Therapy:  Overcoming Obstacles   Participation Level:  Active   Description of Group:    In this group patients will be encouraged to explore what they see as obstacles to their own wellness and recovery. They will be guided to discuss their thoughts, feelings, and behaviors related to these obstacles. The group will process together ways to cope with barriers, with attention given to specific choices patients can make. Each patient will be challenged to identify changes they are motivated to make in order to overcome their obstacles. This group will be process-oriented, with patients participating in exploration of their own experiences as well as giving and receiving support and challenge from other group members.   Therapeutic Goals: 1. Patient will identify personal and current obstacles as they relate to admission. 2. Patient will identify barriers that currently interfere with their wellness or overcoming obstacles.  3. Patient will identify feelings, thought process and behaviors related to these barriers. 4. Patient will identify two changes they are willing to make to overcome these obstacles:      Summary of Patient Progress   Nicolas Rogers was attentive and engaged during today's processing group. He shared that his biggest obstacle is "not over thinking things and allowing my anxiety to take control of me." Nicolas Rogers shared his latest suicide attempt and shares "I am glad that I'm alive but I know those thoughts will come back. IT makes me feel like giving up. " Nicolas Rogers was receptive to the emotional support provided by CSW and other group members. He continues to show progress in the group setting with improving insight. His goal is to "talk more about my feelings and try to communicate what I'm going through with supportive people in my life. That is really hard for me."    Therapeutic Modalities:   Cognitive  Behavioral Therapy Solution Focused Therapy Motivational Interviewing Relapse Prevention Therapy  Ledell Peoples Smart, LCSW 02/15/2017 1:12 PM

## 2017-02-15 NOTE — Progress Notes (Signed)
DAR NOTE: Pt present with flat affect and depressed mood in the unit. Pt has been isolating himself. Pt denies physical pain, took all his meds as scheduled. As per self inventory, pt had a fair night sleep, fair appetite, normal energy, and good concentration. Pt rate depression at 4, hopeless ness at 1, and anxiety at 5. Pt's safety ensured with 15 minute and environmental checks. Pt currently denies SI/HI and A/V hallucinations. Pt verbally agrees to seek staff if SI/HI or A/VH occurs and to consult with staff before acting on these thoughts. Will continue POC.

## 2017-02-15 NOTE — Progress Notes (Signed)
Pt has been asleep since the beginning of the shift at 1900.  He had just arrived on the unit a short time before shift change.  Pt appears to be asleep with eye closed.  No distress observed.  Respirations even and unlabored.  Safety is maintained with q15 minute checks.

## 2017-02-15 NOTE — [facility] Suicide Risk Assessment (Signed)
[facility] INPATIENT:  Family/Significant Other Suicide Prevention Education  Suicide Prevention Education:  Education Completed; Wilber Bihari (pt's mother) [phone] has been identified by the patient as the family member/significant other with whom the patient will be residing, and identified as the person(s) who will aid the patient in the event of a mental health crisis (suicidal ideations/suicide attempt).  With written consent from the patient, the family member/significant other has been provided the following suicide prevention education, prior to the and/or following the discharge of the patient.  The suicide prevention education provided includes the following:  Suicide risk factors  Suicide prevention and interventions  National Suicide Hotline telephone number  [facility] assessment telephone number  [facility] Emergency Assistance 911  [facility] and/or Residential Mobile Crisis Unit telephone number  Request made of family/significant other to:  Remove weapons (e.g., guns, rifles, knives), all items previously/currently identified as safety concern.    Remove drugs/medications (over-the-counter, prescriptions, illicit drugs), all items previously/currently identified as a safety concern.  The family member/significant other verbalizes understanding of the suicide prevention education information provided.  The family member/significant other agrees to remove the items of safety concern listed above.  Pt is able to return home at discharge with his mother. No access to guns or firearms.   Thatcher Doberstein N Smart LCSW 02/15/2017, 4:03 PM

## 2017-02-16 MED ORDER — MIRTAZAPINE 15 MG PO TABS
15.0000 mg | ORAL_TABLET | Freq: Every day | ORAL | Status: DC
  Administered 2017-02-16 – 2017-02-19 (×4): 15 mg via ORAL
  Filled 2017-02-16 (×5): qty 1

## 2017-02-16 NOTE — Progress Notes (Signed)
Patient ID: Nicolas Rogers, male   DOB: [birth year], 21 y.o.   MRN: [id number]  Pt currently presents with a flat affect and guarded behavior. Pt remains in the dayroom but has limited interaction with peers. Pt reports poor sleep PTA due to "nightmares." Denies the nightmares are flashbacks currently. Requests to receive items dropped off by his visitor earlier. Reports ongoing cravings for substance use.  Pt provided with medications per providers orders. Pt's labs and vitals were monitored throughout the night. Pt given a 1:1 about emotional and mental status. Pt supported and encouraged to express concerns and questions. Pt educated on medications.  Pt's safety ensured with 15 minute and environmental checks. Pt currently denies SI/HI and A/V hallucinations. Pt verbally agrees to seek staff if SI/HI or A/VH occurs and to consult with staff before acting on any harmful thoughts. Will continue POC.

## 2017-02-16 NOTE — Progress Notes (Signed)
Pt reports having a better day.  He was commended for being out in the milieu instead of being in his room in bed as he was yesterday.  He denies SI/HI/AVH at this time, but says he does hear voices from time to time telling him to hurt himself.  He was pleasant and cooperative.  He does not want to take anything for sleep, but was informed he has something available if he needs it.  Pt was encouraged to make his needs known to staff.  Support and encouragement offered.  Discharge plans are in process.  Pt plans to return home with his mother and brother.  Safety maintained with q15 minute checks.

## 2017-02-16 NOTE — Progress Notes (Signed)
D  ---   Pt agrees to contract for safety and denies pain.  He  has minimal , avertive eye contact and is suspicious of staff . He appears paranoid at times .    attend   groups  and stays to himself in the day room or his own room  .  Pt takes medications as asked and shows no sign of adverse effects.  Pt ambulates hall without assistance and has a steady gait .  Pt is eating well and has good sleep.  Pt shows no negative behaviors . ---  A  ---  Provide support , safety and encouragement  ---  R  ---  Pt remains safe on unit  Patient ID: Nicolas Rogers, male   DOB: Jan 17, 1996, 21 y.o.   MRN: [id number]

## 2017-02-16 NOTE — Progress Notes (Signed)
[facility] MD Progress Note  02/16/2017 1:52 PM Nicolas Rogers  MRN:  [id number] Subjective:   21 y.o  AAM single, lives with his mother. Works as a Special educational needs teacher. Background history of Mood disorder, SUD and SCD Presented to the ER via the police. Had called suicide hotline himself and expressed suicidal ideation. Overdosed on five pills of percocet before calling suicide hotline. Sent goodbye messages electronically to friends and family memebers . Routine labs show impaired hepatic and blood counts in keeping with chronic hemolytic disease.  UDS negative though he admits to regular use of THC.Negative for alcohol.   Chart reviewed today. Patient discussed at team today. His family is supportive and came to visit him last night.  Staff reports that he has been interacting well with peers. He has been participating at unit groups and activities. No behavioral issues. No side effects from his medications. He has not been observed to be internally stimulated. He has not voiced any suicidal thoughts. Eats and drinks normally.   Seen today. Says he feels better. Tolerated sertraline well. No observable side effects. His family came to see him yesterday. Says his girlfriend called yesterday too. Feels supported and loved.  Reports poor sleep last night. Relates this to environmental factors. No hallucination in any modality. Hypnagogic hallucinations are at baseline. Patient says he plans to get back into college and complete his degree.   Principal Problem: MDD (major depressive disorder), recurrent severe, without psychosis (HCC) Diagnosis:   Patient Active Problem List   Diagnosis Date Noted  . MDD (major depressive disorder), recurrent severe, without psychosis (HCC) [F33.2] 02/15/2017  . Major depressive disorder, single episode with psychotic features (HCC) [F32.3] 02/14/2017  . MDD (major depressive episode), single episode, severe, no psychosis (HCC) [F32.2] 02/14/2017  . Anxiety, generalized  [F41.1] 09/01/2016  . Visual hallucinations [R44.1] 09/01/2016  . Auditory hallucinations [R44.0] 09/01/2016  . Hb-SS disease with acute chest syndrome (HCC) [D57.01] 08/29/2016  . CAP (community acquired pneumonia) [J18.9] 08/29/2016  . Hb-SS disease with crisis (HCC) [D57.00] 08/23/2016  . Vitamin D deficiency [E55.9] 03/12/2014  . Need for immunization against influenza [Z23] 03/12/2014  . Hb-SS disease without crisis [facility]) [D57.1] 01/28/2013   Total Time spent with patient: 20 minutes  Past Psychiatric History: As in H&P  Past Medical History:  Past Medical History:  Diagnosis Date  . Cholecystolithiasis   . Schizophrenia (HCC)   . Sickle cell anemia (HCC)     Past Surgical History:  Procedure Laterality Date  . ADENOIDECTOMY    . TONSILLECTOMY     Family History: History reviewed. No pertinent family history. Family Psychiatric  History: As in H&P Social History:  History  Alcohol Use No     History  Drug Use No    Social History   Social History  . Marital status: Single    Spouse name: N/A  . Number of children: N/A  . Years of education: N/A   Social History Main Topics  . Smoking status: Never Smoker  . Smokeless tobacco: Never Used  . Alcohol use No  . Drug use: No  . Sexual activity: Not Asked   Other Topics Concern  . None   Social History Narrative  . None   Additional Social History:       Sleep: Says he did not sleep well last night  Appetite:  Good  Current Medications: Current Facility-Administered Medications  Medication Dose Route Frequency Provider Last Rate Last Dose  . acetaminophen (TYLENOL) tablet 650  mg  650 mg Oral Q6H PRN Laveda Abbe, NP      . alum & mag hydroxide-simeth (MAALOX/MYLANTA) 200-200-20 MG/5ML suspension 30 mL  30 mL Oral Q4H PRN Laveda Abbe, NP      . hydrOXYzine (ATARAX/VISTARIL) tablet 25 mg  25 mg Oral TID PRN Laveda Abbe, NP      . magnesium hydroxide (MILK OF MAGNESIA)  suspension 30 mL  30 mL Oral Daily PRN Laveda Abbe, NP      . sertraline (ZOLOFT) tablet 25 mg  25 mg Oral Daily Izediuno, Delight Ovens, MD   25 mg at 02/16/17 0756  . traZODone (DESYREL) tablet 50 mg  50 mg Oral QHS PRN Laveda Abbe, NP        Lab Results: No results found for this or any previous visit (from the past 48 hour(s)).  Blood Alcohol level:  Lab Results  Component Value Date   ETH <10 02/14/2017    Metabolic Disorder Labs: No results found for: HGBA1C, MPG No results found for: PROLACTIN No results found for: CHOL, TRIG, HDL, CHOLHDL, VLDL, LDLCALC  Physical Findings: AIMS: Facial and Oral Movements Muscles of Facial Expression: None, normal Lips and Perioral Area: None, normal Jaw: None, normal Tongue: None, normal,Extremity Movements Upper (arms, wrists, hands, fingers): None, normal Lower (legs, knees, ankles, toes): None, normal, Trunk Movements Neck, shoulders, hips: None, normal, Overall Severity Severity of abnormal movements (highest score from questions above): None, normal Incapacitation due to abnormal movements: None, normal Patient's awareness of abnormal movements (rate only patient's report): No Awareness, Dental Status Current problems with teeth and/or dentures?: No Does patient usually wear dentures?: No  CIWA:    COWS:     Musculoskeletal: Strength & Muscle Tone: within normal limits Gait & Station: normal Patient leans: N/A  Psychiatric Specialty Exam: Physical Exam  Constitutional: He is oriented to person, place, and time. He appears well-developed and well-nourished.  HENT:  Head: Normocephalic and atraumatic.  Respiratory: Effort normal.  Neurological: He is alert and oriented to person, place, and time.  Skin: Skin is warm and dry.  Psychiatric:  As above    ROS  Blood pressure 110/73, pulse 88, temperature 98.2 F (36.8 C), temperature source Oral, resp. rate 16, height 6' (1.829 m), weight 64.9 kg (143 lb),  SpO2 100 %.Body mass index is 19.39 kg/m.  General Appearance: Neatly dressed today, calm and cooperative. was in group just before interview.  Appropriate behavior.   Eye Contact:  Good  Speech:  Clear and Coherent and Normal Rate  Volume:  Normal  Mood:  Feels better  Affect:  Appropriate and Full Range  Thought Process:  Goal Directed and Linear  Orientation:  Full (Time, Place, and Person)  Thought Content:  No delusional theme. No preoccupation with violent thoughts. No negative ruminations. No obsession.  No hallucination in any modality.   Suicidal Thoughts:  No  Homicidal Thoughts:  No  Memory:  Immediate;   Good Recent;   Good Remote;   Good  Judgement:  Better  Insight:  Partial  Psychomotor Activity:  Normal  Concentration:  Concentration: Good and Attention Span: Good  Recall:  Good  Fund of Knowledge:  Good  Language:  Good  Akathisia:  Negative  Handed:    AIMS (if indicated):     Assets:  Communication Skills Desire for Improvement Housing Intimacy Resilience Social Support Talents/Skills Transportation Vocational/Educational  ADL's:  Intact  Cognition:  WNL  Sleep:  Number  of Hours: 5.75     Treatment Plan Summary: Depression and adjustment issues are lifting. Patient is tolerating his medication well. We discussed augmentation with low dose Mirtazapine to target insomnia. Patient consented to treatment after we reviewed the risks and benefits.   Psychiatric: MDD Adjustment disorder SUD  Medical: SCD  Psychosocial:   PLAN: 1. Add Mirtazapine 15 mg at bedtime 2. Continue to monitor mood, behavior and interaction with peers 3. Continue to motivate patient towards abstinence from [facility]   Georgiann Cocker, MD 02/16/2017, 1:52 PM

## 2017-02-16 NOTE — [facility] Group Notes (Signed)
LCSW Group Therapy Note  02/16/2017 1:15pm  Type of Therapy/Topic:  Group Therapy:  Balance in Life  Participation Level:  Active  Description of Group:    This group will address the concept of balance and how it feels and looks when one is unbalanced. Patients will be encouraged to process areas in their lives that are out of balance and identify reasons for remaining unbalanced. Facilitators will guide patients in utilizing problem-solving interventions to address and correct the stressor making their life unbalanced. Understanding and applying boundaries will be explored and addressed for obtaining and maintaining a balanced life. Patients will be encouraged to explore ways to assertively make their unbalanced needs known to significant others in their lives, using other group members and facilitator for support and feedback.  Therapeutic Goals: 1. Patient will identify two or more emotions or situations they have that consume much of in their lives. 2. Patient will identify signs/triggers that life has become out of balance:  3. Patient will identify two ways to set boundaries in order to achieve balance in their lives:  4. Patient will demonstrate ability to communicate their needs through discussion and/or role plays  Summary of Patient Progress:  Nicolas Rogers was attentive and engaged during today's processing group. He shares that he feels more balanced today and is being put on medication to assist with sleep. Nicolas Rogers shared that his mother is a good listener and support person for him. He continues to demonstrate progress in the group setting with improving insight. "I need to open up to people more."     Therapeutic Modalities:   Cognitive Behavioral Therapy Solution-Focused Therapy Assertiveness Training  [employer], LCSW 02/16/2017 3:20 PM

## 2017-02-17 NOTE — [facility] Group Notes (Signed)
[facility] LCSW Group Therapy Note  02/17/2017 at 1:15 PM  Type of Therapy and Topic:  Group Therapy: Avoiding Old Behaviors and psycho education on Post Acute Withdrawal Syndrome  Participation Level:  None  Affect:  Resistant   Therapeutic models used: Cognitive Behavioral Therapy,  Person-Centered Therapy and Motivational Interviewing  Modes of Intervention:  Discussion, Exploration, Orientation, Rapport Building, Socialization and Support   Summary of Progress/Problems: Group session included an educational portion on Post Acute Withdrawal Syndrome (PAWS).  Patients were able to process the information and share feelings related to length of time recovery takes.  Patient shared only his name and remained in group with upper body covered by blanket for remainder.  Carney Bern, LCSW

## 2017-02-17 NOTE — Progress Notes (Signed)
D:  Patient denied SI and HI, contracts for safety.  Denied A/V hallucinations.   A:  Medications administered per MD orders.  Emotional support and encouragement given patient. R:  Safety maintained with 15 minute checks.  

## 2017-02-17 NOTE — Progress Notes (Signed)
D.  Pt forwards very .little on approach, denies complaints at this time.  Pt was positive for evening AA group, observed interacting appropriately but minimally with peers on the unit.  Pt denies SI/HI at this time but has reported auditory and visual hallucinations on day shift.  A.  Support and encouragement offered, medication given as ordered.  R.  Pt remains safe on the unit, will continue to monitor.

## 2017-02-17 NOTE — Progress Notes (Signed)
[facility] MD Progress Note  02/17/2017 2:22 PM Nicolas Rogers  MRN:  [id number] Subjective:   21 y.o  AAM single, lives with his mother. Works as a Special educational needs teacher. Background history of Mood disorder, SUD and SCD Presented to the ER via the police. Had called suicide hotline himself and expressed suicidal ideation. Overdosed on five pills of percocet before calling suicide hotline. Sent goodbye messages electronically to friends and family memebers . Routine labs show impaired hepatic and blood counts in keeping with chronic hemolytic disease.  UDS negative though he admits to regular use of THC.Negative for alcohol.   Chart reviewed today. Patient discussed at team today. His family is supportive and came to visit him last night.  Staff reports that he has been interacting well with peers. He has been participating at unit groups and activities. No behavioral issues. No side effects from his medications. He has not been observed to be internally stimulated. He has not voiced any suicidal thoughts. Eats and drinks normally.   Seen today. Doing well. Says his spirits are lifting. No suicidal thoughts. No homicidal thoughts. Not expressing any side effects. Encouraged.   Principal Problem: MDD (major depressive disorder), recurrent severe, without psychosis (HCC) Diagnosis:   Patient Active Problem List   Diagnosis Date Noted  . MDD (major depressive disorder), recurrent severe, without psychosis (HCC) [F33.2] 02/15/2017  . Major depressive disorder, single episode with psychotic features (HCC) [F32.3] 02/14/2017  . MDD (major depressive episode), single episode, severe, no psychosis (HCC) [F32.2] 02/14/2017  . Anxiety, generalized [F41.1] 09/01/2016  . Visual hallucinations [R44.1] 09/01/2016  . Auditory hallucinations [R44.0] 09/01/2016  . Hb-SS disease with acute chest syndrome (HCC) [D57.01] 08/29/2016  . CAP (community acquired pneumonia) [J18.9] 08/29/2016  . Hb-SS disease with crisis (HCC)  [D57.00] 08/23/2016  . Vitamin D deficiency [E55.9] 03/12/2014  . Need for immunization against influenza [Z23] 03/12/2014  . Hb-SS disease without crisis [facility]) [D57.1] 01/28/2013   Total Time spent with patient: 20 minutes  Past Psychiatric History: As in H&P  Past Medical History:  Past Medical History:  Diagnosis Date  . Cholecystolithiasis   . Schizophrenia (HCC)   . Sickle cell anemia (HCC)     Past Surgical History:  Procedure Laterality Date  . ADENOIDECTOMY    . TONSILLECTOMY     Family History: History reviewed. No pertinent family history. Family Psychiatric  History: As in H&P Social History:  History  Alcohol Use No     History  Drug Use No    Social History   Social History  . Marital status: Single    Spouse name: N/A  . Number of children: N/A  . Years of education: N/A   Social History Main Topics  . Smoking status: Never Smoker  . Smokeless tobacco: Never Used  . Alcohol use No  . Drug use: No  . Sexual activity: Not Asked   Other Topics Concern  . None   Social History Narrative  . None   Additional Social History:       Sleep: Says he did not sleep well last night  Appetite:  Good  Current Medications: Current Facility-Administered Medications  Medication Dose Route Frequency Provider Last Rate Last Dose  . acetaminophen (TYLENOL) tablet 650 mg  650 mg Oral Q6H PRN Laveda Abbe, NP      . alum & mag hydroxide-simeth (MAALOX/MYLANTA) 200-200-20 MG/5ML suspension 30 mL  30 mL Oral Q4H PRN Laveda Abbe, NP      .  hydrOXYzine (ATARAX/VISTARIL) tablet 25 mg  25 mg Oral TID PRN Laveda Abbe, NP      . magnesium hydroxide (MILK OF MAGNESIA) suspension 30 mL  30 mL Oral Daily PRN Laveda Abbe, NP      . mirtazapine (REMERON) tablet 15 mg  15 mg Oral QHS Georgiann Cocker, MD   15 mg at 02/16/17 2304  . sertraline (ZOLOFT) tablet 25 mg  25 mg Oral Daily Tomislav Micale, Delight Ovens, MD   25 mg at 02/17/17 0913   . traZODone (DESYREL) tablet 50 mg  50 mg Oral QHS PRN Laveda Abbe, NP        Lab Results: No results found for this or any previous visit (from the past 48 hour(s)).  Blood Alcohol level:  Lab Results  Component Value Date   ETH <10 02/14/2017    Metabolic Disorder Labs: No results found for: HGBA1C, MPG No results found for: PROLACTIN No results found for: CHOL, TRIG, HDL, CHOLHDL, VLDL, LDLCALC  Physical Findings: AIMS: Facial and Oral Movements Muscles of Facial Expression: None, normal Lips and Perioral Area: None, normal Jaw: None, normal Tongue: None, normal,Extremity Movements Upper (arms, wrists, hands, fingers): None, normal Lower (legs, knees, ankles, toes): None, normal, Trunk Movements Neck, shoulders, hips: None, normal, Overall Severity Severity of abnormal movements (highest score from questions above): None, normal Incapacitation due to abnormal movements: None, normal Patient's awareness of abnormal movements (rate only patient's report): No Awareness, Dental Status Current problems with teeth and/or dentures?: No Does patient usually wear dentures?: No  CIWA:    COWS:     Musculoskeletal: Strength & Muscle Tone: within normal limits Gait & Station: normal Patient leans: N/A  Psychiatric Specialty Exam: Physical Exam  Constitutional: He is oriented to person, place, and time. He appears well-developed and well-nourished.  HENT:  Head: Normocephalic and atraumatic.  Respiratory: Effort normal.  Neurological: He is alert and oriented to person, place, and time.  Skin: Skin is warm and dry.  Psychiatric:  As above    ROS  Blood pressure 107/62, pulse 76, temperature 97.8 F (36.6 C), temperature source Oral, resp. rate 16, height 6' (1.829 m), weight 64.9 kg (143 lb), SpO2 100 %.Body mass index is 19.39 kg/m.  General Appearance: Neatly dressed, pleasant, engaging well and cooperative. Appropriate behavior. Not in any distress. Good  relatedness. Not internally stimulated   Eye Contact:  Good  Speech:  Spontaneous, normal prosody. Normal tone and rate.   Volume:  Normal  Mood:  Feeling better each day.   Affect:  Appropriate and Full Range  Thought Process:  Goal Directed and Linear  Orientation:  Full (Time, Place, and Person)  Thought Content:  No delusional theme. No preoccupation with violent thoughts. No negative ruminations. No obsession.  No hallucination in any modality.   Suicidal Thoughts:  No  Homicidal Thoughts:  No  Memory:  Immediate;   Good Recent;   Good Remote;   Good  Judgement:  Better  Insight:  Partial  Psychomotor Activity:  Normal  Concentration:  Concentration: Good and Attention Span: Good  Recall:  Good  Fund of Knowledge:  Good  Language:  Good  Akathisia:  Negative  Handed:    AIMS (if indicated):     Assets:  Communication Skills Desire for Improvement Housing Intimacy Resilience Social Support Talents/Skills Transportation Vocational/Educational  ADL's:  Intact  Cognition:  WNL  Sleep:  Number of Hours: 5.75     Treatment Plan Summary: Depression is  responding to treatment. Patient is tolerating his medications well. He is not expressing any violent thoughts. Hopeful discharge early next week.   Psychiatric: MDD Adjustment disorder SUD  Medical: SCD  Psychosocial:   PLAN: 1. Continue medications at current dose.  2. Continue to monitor mood, behavior and interaction with peers    Georgiann Cocker, MD 02/17/2017, 2:22 PMPatient ID: Nicolas Rogers, male   DOB: Sep 13, 1995, 21 y.o.   MRN: [id number]

## 2017-02-17 NOTE — [facility] Group Notes (Addendum)
The focus of this group is to educate the patient on the purpose and policies of crisis stabilization and provide a format to answer questions about their admission.  The group details unit policies and expectations of patients while admitted.  Patient did not attend 0900 nurse education orientation group this morning.  Patient stayed in room.  

## 2017-02-17 NOTE — Plan of Care (Signed)
Problem: Education: Goal: Ability to make informed decisions regarding treatment will improve Outcome: Progressing Nurse discussed depression/anxiety/coping skills with patient.    

## 2017-02-17 NOTE — Progress Notes (Signed)
Recreation Therapy Notes  Date: 02/17/17 Time: 0930 Location: [address] Hall Group Room  Group Topic: Stress Management  Goal Area(s) Addresses:  Patient will verbalize importance of using healthy stress management.  Patient will identify positive emotions associated with healthy stress management.   Intervention: Stress Management  Activity :  Meditation.  LRT introduced the concept of meditation to patients.  Patients were to listen and follow along as the meditation played to fully engage in the activity.  Education:  Stress Management, Discharge Planning.   Education Outcome: Acknowledges edcuation/In group clarification offered/Needs additional education  Clinical Observations/Feedback: Pt did not attend group.   Nicolas Rogers, LRT/CTRS         Nicolas Rogers A 02/17/2017 11:56 AM 

## 2017-02-18 NOTE — Progress Notes (Signed)
Data. Patient denies SI/HI/AVH. Verbally contracts for safety on the unit and to come to staff before acting of any self harm thoughts/feelings.  Patient is very guarded in his interactions with others. Eye contact is poor and he appears hesitant to interact and to answer questions. Responses to questions are minimal.He ahs kept to himself for most of the shift today. Affect is very flat and does not brighten..  Action. Emotional support and encouragement offered. Education provided on medication, indications and side effect. Q 15 minute checks done for safety. Response. Safety on the unit maintained through 15 minute checks.  Medications taken as prescribed. Did not attended groups. Remained calm and appropriate through out shift.

## 2017-02-18 NOTE — [facility] Group Notes (Signed)
[facility] Group Notes: (Clinical Social Work)   02/18/2017      Type of Therapy:  Group Therapy   Participation Level:  Did Not Attend despite MHT prompting   Ambrose Mantle, LCSW 02/18/2017, 4:56 PM

## 2017-02-18 NOTE — Progress Notes (Signed)
[facility] Group Notes:  (Nursing/MHT/Case Management/Adjunct)  Date:  02/18/2017  Time:  2045  Type of Therapy:  wrap up group  Participation Level:  Active  Participation Quality:  Appropriate, Attentive, Sharing and Supportive  Affect:  Appropriate and Depressed  Cognitive:  Appropriate  Insight:  Improving  Engagement in Group:  Engaged  Modes of Intervention:  Clarification, Education and Support  Summary of Progress/Problems: Pt reports sleeping a lot today and attributes that to new medication. If patient could change any one thing in his life it would be his over thinking. Pt shares that his thinking too much feeds his anxiety which then leads to depression. Pt mentions also having PTSD. Pt is grateful for his family.   Marcille Buffy 02/18/2017, 11:41 PM

## 2017-02-18 NOTE — Progress Notes (Signed)
[facility] MD Progress Note  02/18/2017 12:48 PM Nicolas Rogers  MRN:  [id number] Subjective:   21 y.o  AAM single, lives with his mother. Works as a Special educational needs teacher. Background history of Mood disorder, SUD and SCD Presented to the ER via the police. Had called suicide hotline himself and expressed suicidal ideation. Overdosed on five pills of percocet before calling suicide hotline. Sent goodbye messages electronically to friends and family memebers . Routine labs show impaired hepatic and blood counts in keeping with chronic hemolytic disease.  UDS negative though he admits to regular use of THC.Negative for alcohol.   Chart reviewed today. Patient discussed at team today.  Nursing staff reports that patient has been appropriate on the unit. Patient has been interacting well with peers. No behavioral issues. Patient has not voiced any suicidal thoughts. Patient has not been observed to be internally stimulated. Patient has been adherent with treatment recommendations. Patient has been tolerating their medication well.   Seen today. Remains in good spirits. No side effects from his medications. Mood has been improving. No violent thoughts towards self or others. Looking forward to discharge soon. . Encouraged.   Principal Problem: MDD (major depressive disorder), recurrent severe, without psychosis (HCC) Diagnosis:   Patient Active Problem List   Diagnosis Date Noted  . MDD (major depressive disorder), recurrent severe, without psychosis (HCC) [F33.2] 02/15/2017  . Major depressive disorder, single episode with psychotic features (HCC) [F32.3] 02/14/2017  . MDD (major depressive episode), single episode, severe, no psychosis (HCC) [F32.2] 02/14/2017  . Anxiety, generalized [F41.1] 09/01/2016  . Visual hallucinations [R44.1] 09/01/2016  . Auditory hallucinations [R44.0] 09/01/2016  . Hb-SS disease with acute chest syndrome (HCC) [D57.01] 08/29/2016  . CAP (community acquired pneumonia) [J18.9]  08/29/2016  . Hb-SS disease with crisis (HCC) [D57.00] 08/23/2016  . Vitamin D deficiency [E55.9] 03/12/2014  . Need for immunization against influenza [Z23] 03/12/2014  . Hb-SS disease without crisis [facility]) [D57.1] 01/28/2013   Total Time spent with patient: 20 minutes  Past Psychiatric History: As in H&P  Past Medical History:  Past Medical History:  Diagnosis Date  . Cholecystolithiasis   . Schizophrenia (HCC)   . Sickle cell anemia (HCC)     Past Surgical History:  Procedure Laterality Date  . ADENOIDECTOMY    . TONSILLECTOMY     Family History: History reviewed. No pertinent family history. Family Psychiatric  History: As in H&P Social History:  History  Alcohol Use No     History  Drug Use No    Social History   Social History  . Marital status: Single    Spouse name: N/A  . Number of children: N/A  . Years of education: N/A   Social History Main Topics  . Smoking status: Never Smoker  . Smokeless tobacco: Never Used  . Alcohol use No  . Drug use: No  . Sexual activity: Not Asked   Other Topics Concern  . None   Social History Narrative  . None   Additional Social History:       Sleep: Says he did not sleep well last night  Appetite:  Good  Current Medications: Current Facility-Administered Medications  Medication Dose Route Frequency Provider Last Rate Last Dose  . acetaminophen (TYLENOL) tablet 650 mg  650 mg Oral Q6H PRN Laveda Abbe, NP      . alum & mag hydroxide-simeth (MAALOX/MYLANTA) 200-200-20 MG/5ML suspension 30 mL  30 mL Oral Q4H PRN Laveda Abbe, NP      .  hydrOXYzine (ATARAX/VISTARIL) tablet 25 mg  25 mg Oral TID PRN Laveda Abbe, NP      . magnesium hydroxide (MILK OF MAGNESIA) suspension 30 mL  30 mL Oral Daily PRN Laveda Abbe, NP      . mirtazapine (REMERON) tablet 15 mg  15 mg Oral QHS Georgiann Cocker, MD   15 mg at 02/17/17 2253  . sertraline (ZOLOFT) tablet 25 mg  25 mg Oral Daily  Weldon Nouri, Delight Ovens, MD   25 mg at 02/18/17 0836  . traZODone (DESYREL) tablet 50 mg  50 mg Oral QHS PRN Laveda Abbe, NP        Lab Results: No results found for this or any previous visit (from the past 48 hour(s)).  Blood Alcohol level:  Lab Results  Component Value Date   ETH <10 02/14/2017    Metabolic Disorder Labs: No results found for: HGBA1C, MPG No results found for: PROLACTIN No results found for: CHOL, TRIG, HDL, CHOLHDL, VLDL, LDLCALC  Physical Findings: AIMS: Facial and Oral Movements Muscles of Facial Expression: None, normal Lips and Perioral Area: None, normal Jaw: None, normal Tongue: None, normal,Extremity Movements Upper (arms, wrists, hands, fingers): None, normal Lower (legs, knees, ankles, toes): None, normal, Trunk Movements Neck, shoulders, hips: None, normal, Overall Severity Severity of abnormal movements (highest score from questions above): None, normal Incapacitation due to abnormal movements: None, normal Patient's awareness of abnormal movements (rate only patient's report): No Awareness, Dental Status Current problems with teeth and/or dentures?: No Does patient usually wear dentures?: No  CIWA:  CIWA-Ar Total: 1 COWS:  COWS Total Score: 1  Musculoskeletal: Strength & Muscle Tone: within normal limits Gait & Station: normal Patient leans: N/A  Psychiatric Specialty Exam: Physical Exam  Constitutional: He is oriented to person, place, and time. He appears well-developed and well-nourished.  HENT:  Head: Normocephalic and atraumatic.  Respiratory: Effort normal.  Neurological: He is alert and oriented to person, place, and time.  Skin: Skin is warm and dry.  Psychiatric:  As above    ROS  Blood pressure 100/70, pulse (!) 103, temperature 97.9 F (36.6 C), temperature source Oral, resp. rate 18, height 6' (1.829 m), weight 64.9 kg (143 lb), SpO2 100 %.Body mass index is 19.39 kg/m.  General Appearance: Neatly dressed,  pleasant, engaging well and cooperative. Appropriate behavior. Not in any distress. Good relatedness. Not internally stimulated   Eye Contact:  Good  Speech:  Spontaneous, normal prosody. Normal tone and rate.   Volume:  Normal  Mood:  Feeling better each day.   Affect:  Appropriate and Full Range  Thought Process:  Goal Directed and Linear  Orientation:  Full (Time, Place, and Person)  Thought Content:  No delusional theme. No preoccupation with violent thoughts. No negative ruminations. No obsession.  No hallucination in any modality.   Suicidal Thoughts:  No  Homicidal Thoughts:  No  Memory:  Immediate;   Good Recent;   Good Remote;   Good  Judgement:  Better  Insight:  Partial  Psychomotor Activity:  Normal  Concentration:  Concentration: Good and Attention Span: Good  Recall:  Good  Fund of Knowledge:  Good  Language:  Good  Akathisia:  Negative  Handed:    AIMS (if indicated):     Assets:  Communication Skills Desire for Improvement Housing Intimacy Resilience Social Support Talents/Skills Transportation Vocational/Educational  ADL's:  Intact  Cognition:  WNL  Sleep:  Number of Hours: 6.75     Treatment  Plan Summary: Depression is responding to treatment. Patient is tolerating his medications well. He is not a danger to self or others. Hopeful discharge on Monday.   Psychiatric: MDD Adjustment disorder SUD  Medical: SCD  Psychosocial:   PLAN: 1. Continue medications at current dose.  2. Continue to monitor mood, behavior and interaction with peers    Georgiann Cocker, MD 02/18/2017, 12:48 PMPatient ID: Nicolas Rogers, male   DOB: Feb 10, 1996, 21 y.o.   MRN: [id number] Patient ID: Nicolas Rogers, male   DOB: [birth year], 21 y.o.   MRN: [id number]

## 2017-02-19 NOTE — Progress Notes (Signed)
D.  Pt presents with flat affect but pleasant demeanor.  Pt denies complaints at this time. Pt was positive for evening wrap up group, observed interacting more with peers on the unit this evening.   Pt denies SI/HI/AVH at this time.  A.  Support and encouragement offered, medications given as ordered.  R.  Pt remains safe on the unit, will continue to monitor.

## 2017-02-19 NOTE — Progress Notes (Signed)
Patient did attend the evening speaker AA meeting.  

## 2017-02-19 NOTE — [facility] Group Notes (Signed)
[facility] LCSW Group Therapy Note  Date/Time:    02/19/2017   1:15-2:15PM  Type of Therapy and Topic:  Group Therapy:  Healthy Self Image and Positive Change  Participation Level:  Minimal   Description of Group:  In this group, patients compared and contrasted their current "I am...." statements to the visions they identified as desirable for their lives.  Patients discussed their tendency toward cognitive distortions, and how they can go about making positive changes in their cognitions that will positively impact their behaviors.  Many expressions of similarities and mutual support were provided among group members.  Facilitator played a motivational 3-minute speech and a discussion was held regarding reactions.  Patients were left with the task of thinking about what "I am...." statements they can start using in their lives immediately.  Therapeutic Goals: 1. Patient will state their current self-perception as expressed in an "I Am" statement 2. Patient will contrast this with their desired vision for their lives 3. Patient will discuss cognitive distortions and how these affect their ongoing "I Am" thoughts 4. Patient will verbalize statements that challenge their cognitive distortions  Summary of Patient Progress:  The patient expressed initially "I am tired", then by the end of group was able to state "I am at peace" and explain why he was able to make this change.   Therapeutic Modalities Cognitive Behavioral Therapy Motivational Interviewing  Ambrose Mantle, LCSW 02/19/2017 8:21 AM

## 2017-02-19 NOTE — [facility] Group Notes (Signed)
[facility] Group Notes:  (Nursing/MHT/Case Management/Adjunct)  Date:  02/19/2017  Time:  6:55 PM  Type of Therapy:  Nurse Education  Participation Level:  Did Not Attend   Summary of Progress/Problems:  Discussed healthy coping skills and how to change negative coping skills.   Almira Bar 02/19/2017, 6:55 PM

## 2017-02-19 NOTE — Progress Notes (Signed)
[facility] MD Progress Note  02/19/2017 4:50 PM Murry Diaz  MRN:  [id number] Subjective:   21 y.o  AAM single, lives with his mother. Works as a Special educational needs teacher. Background history of Mood disorder, SUD and SCD Presented to the ER via the police. Had called suicide hotline himself and expressed suicidal ideation. Overdosed on five pills of percocet before calling suicide hotline. Sent goodbye messages electronically to friends and family memebers . Routine labs show impaired hepatic and blood counts in keeping with chronic hemolytic disease.  UDS negative though he admits to regular use of THC.Negative for alcohol.   Chart reviewed today. Patient discussed at team today.  Staff reports that patient has been appropriate on the unit. Patient has been interacting well with peers. No behavioral issues. Patient has not voiced any suicidal thoughts. Patient has not been observed to be internally stimulated. Patient has been adherent with treatment recommendations. Patient has been tolerating their medication well.   Seen today. Doing well. No longer depressed. Able to enjoy activities. No negative ruminations. No suicidal or homicidal thoughts. futre oriented and looks forward to discharge tomorrow.   Principal Problem: MDD (major depressive disorder), recurrent severe, without psychosis (HCC) Diagnosis:   Patient Active Problem List   Diagnosis Date Noted  . MDD (major depressive disorder), recurrent severe, without psychosis (HCC) [F33.2] 02/15/2017  . Major depressive disorder, single episode with psychotic features (HCC) [F32.3] 02/14/2017  . MDD (major depressive episode), single episode, severe, no psychosis (HCC) [F32.2] 02/14/2017  . Anxiety, generalized [F41.1] 09/01/2016  . Visual hallucinations [R44.1] 09/01/2016  . Auditory hallucinations [R44.0] 09/01/2016  . Hb-SS disease with acute chest syndrome (HCC) [D57.01] 08/29/2016  . CAP (community acquired pneumonia) [J18.9] 08/29/2016  .  Hb-SS disease with crisis (HCC) [D57.00] 08/23/2016  . Vitamin D deficiency [E55.9] 03/12/2014  . Need for immunization against influenza [Z23] 03/12/2014  . Hb-SS disease without crisis [facility]) [D57.1] 01/28/2013   Total Time spent with patient: 20 minutes  Past Psychiatric History: As in H&P  Past Medical History:  Past Medical History:  Diagnosis Date  . Cholecystolithiasis   . Schizophrenia (HCC)   . Sickle cell anemia (HCC)     Past Surgical History:  Procedure Laterality Date  . ADENOIDECTOMY    . TONSILLECTOMY     Family History: History reviewed. No pertinent family history. Family Psychiatric  History: As in H&P Social History:  History  Alcohol Use No     History  Drug Use No    Social History   Social History  . Marital status: Single    Spouse name: N/A  . Number of children: N/A  . Years of education: N/A   Social History Main Topics  . Smoking status: Never Smoker  . Smokeless tobacco: Never Used  . Alcohol use No  . Drug use: No  . Sexual activity: Not Asked   Other Topics Concern  . None   Social History Narrative  . None   Additional Social History:       Sleep: Says he did not sleep well last night  Appetite:  Good  Current Medications: Current Facility-Administered Medications  Medication Dose Route Frequency Provider Last Rate Last Dose  . acetaminophen (TYLENOL) tablet 650 mg  650 mg Oral Q6H PRN Laveda Abbe, NP      . alum & mag hydroxide-simeth (MAALOX/MYLANTA) 200-200-20 MG/5ML suspension 30 mL  30 mL Oral Q4H PRN Laveda Abbe, NP      . hydrOXYzine (ATARAX/VISTARIL) tablet  25 mg  25 mg Oral TID PRN Laveda Abbe, NP      . magnesium hydroxide (MILK OF MAGNESIA) suspension 30 mL  30 mL Oral Daily PRN Laveda Abbe, NP      . mirtazapine (REMERON) tablet 15 mg  15 mg Oral QHS Izediuno, Delight Ovens, MD   15 mg at 02/18/17 2231  . sertraline (ZOLOFT) tablet 25 mg  25 mg Oral Daily Izediuno, Delight Ovens, MD   25 mg at 02/19/17 0742  . traZODone (DESYREL) tablet 50 mg  50 mg Oral QHS PRN Laveda Abbe, NP        Lab Results: No results found for this or any previous visit (from the past 48 hour(s)).  Blood Alcohol level:  Lab Results  Component Value Date   ETH <10 02/14/2017    Metabolic Disorder Labs: No results found for: HGBA1C, MPG No results found for: PROLACTIN No results found for: CHOL, TRIG, HDL, CHOLHDL, VLDL, LDLCALC  Physical Findings: AIMS: Facial and Oral Movements Muscles of Facial Expression: None, normal Lips and Perioral Area: None, normal Jaw: None, normal Tongue: None, normal,Extremity Movements Upper (arms, wrists, hands, fingers): None, normal Lower (legs, knees, ankles, toes): None, normal, Trunk Movements Neck, shoulders, hips: None, normal, Overall Severity Severity of abnormal movements (highest score from questions above): None, normal Incapacitation due to abnormal movements: None, normal Patient's awareness of abnormal movements (rate only patient's report): No Awareness, Dental Status Current problems with teeth and/or dentures?: No Does patient usually wear dentures?: No  CIWA:  CIWA-Ar Total: 1 COWS:  COWS Total Score: 1  Musculoskeletal: Strength & Muscle Tone: within normal limits Gait & Station: normal Patient leans: N/A  Psychiatric Specialty Exam: Physical Exam  Constitutional: He is oriented to person, place, and time. He appears well-developed and well-nourished.  HENT:  Head: Normocephalic and atraumatic.  Respiratory: Effort normal.  Neurological: He is alert and oriented to person, place, and time.  Skin: Skin is warm and dry.  Psychiatric:  As above    ROS  Blood pressure 122/78, pulse (!) 106, temperature 98.7 F (37.1 C), temperature source Oral, resp. rate 16, height 6' (1.829 m), weight 64.9 kg (143 lb), SpO2 100 %.Body mass index is 19.39 kg/m.  General Appearance: Neatly dressed, pleasant, engaging well  and cooperative. Appropriate behavior. Not in any distress. Good relatedness. Not internally stimulated   Eye Contact:  Good  Speech:  Spontaneous, normal prosody. Normal tone and rate.   Volume:  Normal  Mood:  Euthymic  Affect:  Full range and appropriate. Mobilizing positive affect appropriately.   Thought Process:  Goal Directed and Linear  Orientation:  Full (Time, Place, and Person)  Thought Content:  Future oriented. No delusional theme. No preoccupation with violent thoughts. No negative ruminations. No obsession.  No hallucination in any modality.   Suicidal Thoughts:  No  Homicidal Thoughts:  No  Memory:  Immediate;   Good Recent;   Good Remote;   Good  Judgement:  Good  Insight:  Good  Psychomotor Activity:  Normal  Concentration:  Concentration: Good and Attention Span: Good  Recall:  Good  Fund of Knowledge:  Good  Language:  Good  Akathisia:  Negative  Handed:    AIMS (if indicated):     Assets:  Communication Skills Desire for Improvement Housing Intimacy Resilience Social Support Talents/Skills Transportation Vocational/Educational  ADL's:  Intact  Cognition:  WNL  Sleep:  Number of Hours: 5.75     Treatment  Plan Summary: Depression is responding to treatment. Patient is tolerating his medications well. He is not a danger to self or others. Hopeful discharge tomorrow.   Psychiatric: MDD Adjustment disorder SUD  Medical: SCD  Psychosocial:   PLAN: 1. Continue medications at current dose.  2. Continue to monitor mood, behavior and interaction with peers    Georgiann Cocker, MD 02/19/2017, 4:50 PMPatient ID: Bebe Liter, male   DOB: 1995/12/28, 21 y.o.   MRN: [id number] Patient ID: Amarian Botero, male   DOB: Aug 20, 1995, 21 y.o.   MRN: [id number] Patient ID: Thornton Dohrmann, male   DOB: 06/11/1995, 21 y.o.   MRN: [id number]

## 2017-02-19 NOTE — Progress Notes (Signed)
Data. Patient denies SI/HI/AVH. Verbally contracts for safety on the unit and to come to staff before acting of any self harm thoughts/feelings.  Patient is less guarded in his interactions with peers this shift and he spentt much of his time in the dayroom with peers talking. Patient continues to be guarded with staff. Minimal responses to questions, no initiation, poor eye contact. On his self assessment patient reports 1/10 for depression, 0/10 for hopelessness and 4/10 for anxiety. His goal today is, "Myself." Action. Emotional support and encouragement offered. Education provided on medication, indications and side effect. Q 15 minute checks done for safety. Response. Safety on the unit maintained through 15 minute checks.  Medications taken as prescribed. Patient did not attended groups. Remained calm and appropriate through out shift.

## 2017-02-20 MED ORDER — HYDROXYZINE HCL 25 MG PO TABS: 25.0000 mg | ORAL_TABLET | Freq: Three times a day (TID) | ORAL | 0 refills | Status: DC | PRN

## 2017-02-20 MED ORDER — SERTRALINE HCL 25 MG PO TABS: 25.0000 mg | ORAL_TABLET | Freq: Every day | ORAL | 0 refills | Status: DC

## 2017-02-20 MED ORDER — MIRTAZAPINE 15 MG PO TABS: 15.0000 mg | ORAL_TABLET | Freq: Every day | ORAL | 0 refills | Status: DC

## 2017-02-20 MED ORDER — TRAZODONE HCL 50 MG PO TABS: 50.0000 mg | ORAL_TABLET | Freq: Every evening | ORAL | 0 refills | Status: DC | PRN

## 2017-02-20 NOTE — Discharge Summary (Signed)
Physician Discharge Summary Note  Patient:  Nicolas Rogers is an 21 y.o., male MRN:  [id number] DOB:  Mar 07, 1996 Patient phone:  [phone] (home)  Patient address:   [address] [place] [address],   Total Time spent with patient: Greater than 30 minutes  Date of Admission:  02/14/2017 Date of Discharge: 02-20-17  Reason for Admission: Suicidal ideations.   Principal Problem: MDD (major depressive disorder), recurrent severe, without psychosis [facility])  Discharge Diagnoses: Patient Active Problem List   Diagnosis Date Noted  . MDD (major depressive disorder), recurrent severe, without psychosis (HCC) [F33.2] 02/15/2017    Priority: High  . Major depressive disorder, single episode with psychotic features (HCC) [F32.3] 02/14/2017  . MDD (major depressive episode), single episode, severe, no psychosis (HCC) [F32.2] 02/14/2017  . Anxiety, generalized [F41.1] 09/01/2016  . Visual hallucinations [R44.1] 09/01/2016  . Auditory hallucinations [R44.0] 09/01/2016  . Hb-SS disease with acute chest syndrome (HCC) [D57.01] 08/29/2016  . CAP (community acquired pneumonia) [J18.9] 08/29/2016  . Hb-SS disease with crisis (HCC) [D57.00] 08/23/2016  . Vitamin D deficiency [E55.9] 03/12/2014  . Need for immunization against influenza [Z23] 03/12/2014  . Hb-SS disease without crisis [facility]) [D57.1] 01/28/2013   Past Psychiatric History: Major depressive disorder with psychotic features.  Past Medical History:  Past Medical History:  Diagnosis Date  . Cholecystolithiasis   . Schizophrenia (HCC)   . Sickle cell anemia (HCC)     Past Surgical History:  Procedure Laterality Date  . ADENOIDECTOMY    . TONSILLECTOMY     Family History: History reviewed. No pertinent family history.  Family Psychiatric  History: See H&P.  Social History:  History  Alcohol Use No     History  Drug Use No    Social History   Social History  . Marital status: Single    Spouse name: N/A  .  Number of children: N/A  . Years of education: N/A   Social History Main Topics  . Smoking status: Never Smoker  . Smokeless tobacco: Never Used  . Alcohol use No  . Drug use: No  . Sexual activity: Not Asked   Other Topics Concern  . None   Social History Narrative  . None   Hospital Course: (Per Md's SRA): Nicolas Rogers is a 21y.o AAM single, lives with his mother. Works as a Special educational needs teacher. Background history of Mood disorder,SUD and SCDPresented to the ER via the police. Had called suicide hotline himself and expressed suicidal ideation. Overdosed on five pills of percocet before calling suicide hotline. Sent goodbye messages electronically to friends and family memebers. Routine labs show impaired hepatic and blood counts in keeping with chronic hemolytic disease. UDS negative though he admits to regular use of THC.Negative for alcohol.   After evaluation of his presenting symptoms, Nicolas Rogers was medicated & discharged on; Hydroxyzine 25 mg prn for anxiety, Mirtazapine 15 mg for depression/insomnia, Sertraline 25 mg for depression & Trazodone 50 mg for insomnia. He was also enrolled in the group counseling sessions being offered & held on this unit. He learned coping skills. He presented other significant pre-existing medical issues (Sickle cell anemia), stable. Did not require treatment & did not go into Sickle cell crisis during the course of his hospitalization. He tolerated his treatment regimen without any adverse effects or reactions reported.  Nicolas Rogers is seen today by the attending psychiatrist. He reports that he is in good spirits. Not feeling depressed. Reports normal energy and interest. Has been maintaining normal biological functions. He is  able to think clearly. He is able to focus on task. His thoughts are not crowded or racing. No evidence of mania. No hallucination in any modality. He is not making any delusional statement. No passivity of will/thought. He is fully in touch  with reality. No thoughts of suicide. No thoughts of homicide. No violent thoughts. No overwhelming anxiety. No assess to weapons, no new stressors, good support from his girlfriend and his family.  The nursing staff reports that patient has been appropriate on the unit. Patient has been interacting well with peers. No behavioral issues. Patient has not voiced any suicidal thoughts. Patient has not been observed to be internally stimulated. Patient has been adherent with treatment recommendations. Patient has been tolerating their medication well.   Nicolas Rogers's response to his treatment regimen was discussed at treatment team meeting this morning. The team members feel that patient is back to his baseline level of function. The team agrees with plan to discharge patient today to continue mental health care on an outpatient basis as noted below. He is provided with all the necessary information needed to make this appointment without problems. He left [facility] with all personal belongings in no apparent distress. Transportation per mother.  Physical Findings: AIMS: Facial and Oral Movements Muscles of Facial Expression: None, normal Lips and Perioral Area: None, normal Jaw: None, normal Tongue: None, normal,Extremity Movements Upper (arms, wrists, hands, fingers): None, normal Lower (legs, knees, ankles, toes): None, normal, Trunk Movements Neck, shoulders, hips: None, normal, Overall Severity Severity of abnormal movements (highest score from questions above): None, normal Incapacitation due to abnormal movements: None, normal Patient's awareness of abnormal movements (rate only patient's report): No Awareness, Dental Status Current problems with teeth and/or dentures?: No Does patient usually wear dentures?: No  CIWA:  CIWA-Ar Total: 1 COWS:  COWS Total Score: 1  Musculoskeletal: Strength & Muscle Tone: within normal limits Gait & Station: normal Patient leans: N/A  Psychiatric Specialty  Exam: Physical Exam  Constitutional: He appears well-developed.  HENT:  Head: Normocephalic.  Eyes: Pupils are equal, round, and reactive to light.  Neck: Normal range of motion.  Cardiovascular: Normal rate.   Respiratory: Effort normal.  GI: Soft.  Genitourinary:  Genitourinary Comments: Deferred  Musculoskeletal: Normal range of motion.  Neurological: He is alert.  Skin: Skin is warm.    Review of Systems  Constitutional: Negative.   HENT: Negative.   Eyes: Negative.   Cardiovascular: Negative.   Gastrointestinal: Negative.   Genitourinary: Negative.   Musculoskeletal: Negative.   Skin: Negative.   Neurological: Negative.   Endo/Heme/Allergies: Negative.   Psychiatric/Behavioral: Positive for depression (Stable) and hallucinations (Hx. auditory hallucinations). Negative for memory loss, substance abuse and suicidal ideas. The patient has insomnia (Stable). The patient is not nervous/anxious.     Blood pressure 117/75, pulse 89, temperature 97.8 F (36.6 C), temperature source Oral, resp. rate 14, height 6' (1.829 m), weight 64.9 kg (143 lb), SpO2 100 %.Body mass index is 19.39 kg/m.  See Md's SRA   Have you used any form of tobacco in the last 30 days? (Cigarettes, Smokeless Tobacco, Cigars, and/or Pipes): No  Has this patient used any form of tobacco in the last 30 days? (Cigarettes, Smokeless Tobacco, Cigars, and/or Pipes): N/A  Blood Alcohol level:  Lab Results  Component Value Date   ETH <10 02/14/2017   Metabolic Disorder Labs:  No results found for: HGBA1C, MPG No results found for: PROLACTIN No results found for: CHOL, TRIG, HDL, CHOLHDL, VLDL, LDLCALC  See Psychiatric Specialty Exam and Suicide Risk Assessment completed by Attending Physician prior to discharge.  Discharge destination:  Home  Is patient on multiple antipsychotic therapies at discharge:  No   Has Patient had three or more failed trials of antipsychotic monotherapy by history:   No  Recommended Plan for Multiple Antipsychotic Therapies: NA  Allergies as of 02/20/2017   No Known Allergies     Medication List    STOP taking these medications   folic acid 1 MG tablet Commonly known as:  FOLVITE   hydroxyurea 500 MG capsule Commonly known as:  HYDREA   ibuprofen 600 MG tablet Commonly known as:  ADVIL,MOTRIN   L-glutamine 5 g Pack Powder Packet Commonly known as:  ENDARI   oxyCODONE-acetaminophen 5-325 MG tablet Commonly known as:  PERCOCET/ROXICET   PARoxetine 20 MG tablet Commonly known as:  PAXIL     TAKE these medications     Indication  hydrOXYzine 25 MG tablet Commonly known as:  ATARAX/VISTARIL Take 1 tablet (25 mg total) by mouth 3 (three) times daily as needed for anxiety.  Indication:  Feeling Anxious   mirtazapine 15 MG tablet Commonly known as:  REMERON Take 1 tablet (15 mg total) by mouth at bedtime. For depression/insomnia  Indication:  Major Depressive Disorder, Insomnia   sertraline 25 MG tablet Commonly known as:  ZOLOFT Take 1 tablet (25 mg total) by mouth daily. For depression  Indication:  Major Depressive Disorder   traZODone 50 MG tablet Commonly known as:  DESYREL Take 1 tablet (50 mg total) by mouth at bedtime as needed for sleep (depression).  Indication:  Trouble Sleeping, Major Depressive Disorder      Follow-up Information    [facility] Follow up on 02/22/2017.   Specialty:  Behavioral Health Why:  Hospital follow-up appt with Kendell Bane at 9:00AM on Wednesday, 02/22/17. Thank you.  [address] [place] [address] [phone]          Follow-up recommendations: Activity:  As tolerated Diet: As recommended by your primary care doctor. Keep all scheduled follow-up appointments as recommended.   Comments: Patient is instructed prior to discharge to: Take all medications as prescribed by his/her mental healthcare provider. Report any adverse effects and or reactions from the  medicines to his/her outpatient provider promptly. Patient has been instructed & cautioned: To not engage in alcohol and or illegal drug use while on prescription medicines. In the event of worsening symptoms, patient is instructed to call the crisis hotline, 911 and or go to the nearest ED for appropriate evaluation and treatment of symptoms. To follow-up with his/her primary care provider for your other medical issues, concerns and or health care needs.   Signed: Sanjuana Kava, NP, PMHNP, FNP-BC 02/20/2017, 10:35 AM

## 2017-02-20 NOTE — Progress Notes (Signed)
D . Pt flat on approach, but does brighten at times during conversation.  Pt denies complaints at this time.  Pt did attend evening AA group, observed interacting more with peers on the unit.  Pt denies SI/HI/AVH at this time.  A.  Support and encouragement offered, medication given as ordered  R.  Pt remains safe on the unit, will continue to monitor.

## 2017-02-20 NOTE — Progress Notes (Signed)
  [facility] Adult Case Management Discharge Plan :  Will you be returning to the same living situation after discharge:  Yes,  home At discharge, do you have transportation home?: Yes,  mother Do you have the ability to pay for your medications: Yes,  medicare  Release of information consent forms completed and submitted to medical records by CSW.  Patient to Follow up at: Follow-up Information    [facility] Follow up on 02/22/2017.   Specialty:  Behavioral Health Why:  Hospital follow-up appt with Kendell Bane at 9:00AM on Wednesday, 02/22/17. Thank you.  Contact information: [address] [place] [address] [phone]           Next level of care provider has access to [facility] Link:no  Safety Planning and Suicide Prevention discussed: Yes,  SPE completed with pt's mother. SPI pamphlet and Mobile Crisis information provided to pt as well.   Have you used any form of tobacco in the last 30 days? (Cigarettes, Smokeless Tobacco, Cigars, and/or Pipes): No  Has patient been referred to the Quitline?: N/A patient is not a smoker  Patient has been referred for addiction treatment: Yes  [employer], LCSW 02/20/2017, 9:31 AM

## 2017-02-20 NOTE — Progress Notes (Signed)
Patient ID: Nicolas Rogers, male   DOB: 1996-04-20, 21 y.o.   MRN: [id number]  Discharge Note- Belongings returned to patient at time of discharge. Patient denies any pain or discomfort. Discharge instructions and medications were reviewed with patient as well as patient's mother with permission from patient. Patient verbalized understanding of both medications and discharge instructions. Patient discharged to lobby where his mother was waiting. Q15 minute safety checks maintained until time of discharge. No apparent distress upon discharge.

## 2017-02-20 NOTE — Tx Team (Signed)
Interdisciplinary Treatment and Diagnostic Plan Update  02/20/2017 Time of Session: [id number] Nicolas Rogers MRN: [id number]  Principal Diagnosis: Schizophrenia  Secondary Diagnoses: Principal Problem:   MDD (major depressive disorder), recurrent severe, without psychosis ([place]) Active Problems:   MDD (major depressive episode), single episode, severe, no psychosis ([place])   Current Medications:  Current Facility-Administered Medications  Medication Dose Route Frequency Provider Last Rate Last Dose  . acetaminophen (TYLENOL) tablet 650 mg  650 mg Oral Q6H PRN Ethelene Hal, NP      . alum & mag hydroxide-simeth (MAALOX/MYLANTA) 200-200-20 MG/5ML suspension 30 mL  30 mL Oral Q4H PRN Ethelene Hal, NP      . hydrOXYzine (ATARAX/VISTARIL) tablet 25 mg  25 mg Oral TID PRN Ethelene Hal, NP      . magnesium hydroxide (MILK OF MAGNESIA) suspension 30 mL  30 mL Oral Daily PRN Ethelene Hal, NP      . mirtazapine (REMERON) tablet 15 mg  15 mg Oral QHS Izediuno, Laruth Bouchard, MD   15 mg at 02/19/17 2230  . sertraline (ZOLOFT) tablet 25 mg  25 mg Oral Daily Izediuno, Laruth Bouchard, MD   25 mg at 02/20/17 0823  . traZODone (DESYREL) tablet 50 mg  50 mg Oral QHS PRN Ethelene Hal, NP       PTA Medications: Prescriptions Prior to Admission  Medication Sig Dispense Refill Last Dose  . folic acid (FOLVITE) 1 MG tablet Take 1 tablet (1 mg total) by mouth every morning. (Patient not taking: Reported on 02/14/2017) 30 tablet 11 Not Taking at Unknown time  . hydroxyurea (HYDREA) 500 MG capsule Take 2 capsules (1,000 mg total) by mouth every morning. May take with food to minimize GI side effects. (Patient not taking: Reported on 02/14/2017) 60 capsule 5 Not Taking at Unknown time  . ibuprofen (ADVIL,MOTRIN) 600 MG tablet Take 1 tablet (600 mg total) by mouth every 8 (eight) hours as needed. (Patient not taking: Reported on 12/21/2016) 30 tablet 2 Completed Course at Unknown time   . L-glutamine (ENDARI) 5 g PACK Powder Packet Take 10 g by mouth 2 (two) times daily. (Patient not taking: Reported on 12/21/2016) 120 packet 5 Not Taking at Unknown time  . oxyCODONE-acetaminophen (PERCOCET/ROXICET) 5-325 MG tablet Take 1 tablet by mouth every 6 (six) hours as needed for severe pain. 30 tablet 0 02/14/2017 at Unknown time  . PARoxetine (PAXIL) 20 MG tablet Take 1 tablet (20 mg total) by mouth daily. (Patient not taking: Reported on 12/21/2016) 30 tablet 1 Not Taking at Unknown time    Patient Stressors: Financial difficulties Marital or family conflict Substance abuse  Patient Strengths: Curator fund of knowledge Supportive family/friends  Treatment Modalities: Medication Management, Group therapy, Case management,  1 to 1 session with clinician, Psychoeducation, Recreational therapy.   Physician Treatment Plan for Primary Diagnosis: Schizophrenia Ability to identify changes in lifestyle to reduce recurrence of condition will improve Ability to disclose and discuss suicidal ideas Ability to demonstrate self-control will improve Ability to identify and develop effective coping behaviors will improve Compliance with prescribed medications will improve  Medication Management: Evaluate patient's response, side effects, and tolerance of medication regimen.  Therapeutic Interventions: 1 to 1 sessions, Unit Group sessions and Medication administration.  Evaluation of Outcomes: Met  Physician Treatment Plan for Secondary Diagnosis: Principal Problem:   MDD (major depressive disorder), recurrent severe, without psychosis ([place]) Active Problems:   MDD (major depressive episode), single episode, severe, no psychosis ([place])  Medication Management: Evaluate patient's response, side effects, and tolerance of medication regimen.  Therapeutic Interventions: 1 to 1 sessions, Unit Group sessions and Medication administration.  Evaluation of Outcomes:  Met   RN Treatment Plan for Primary Diagnosis: Schizophrenia Long Term Goal(s): Knowledge of disease and therapeutic regimen to maintain health will improve  Short Term Goals: Ability to remain free from injury will improve, Ability to demonstrate self-control and Ability to disclose and discuss suicidal ideas  Medication Management: RN will administer medications as ordered by provider, will assess and evaluate patient's response and provide education to patient for prescribed medication. RN will report any adverse and/or side effects to prescribing provider.  Therapeutic Interventions: 1 on 1 counseling sessions, Psychoeducation, Medication administration, Evaluate responses to treatment, Monitor vital signs and CBGs as ordered, Perform/monitor CIWA, COWS, AIMS and Fall Risk screenings as ordered, Perform wound care treatments as ordered.  Evaluation of Outcomes: Met   LCSW Treatment Plan for Primary Diagnosis:Schizophrenia Long Term Goal(s): Safe transition to appropriate next level of care at discharge, Engage patient in therapeutic group addressing interpersonal concerns.  Short Term Goals: Engage patient in aftercare planning with referrals and resources, Facilitate patient progression through stages of change regarding substance use diagnoses and concerns and Identify triggers associated with mental health/substance abuse issues  Therapeutic Interventions: Assess for all discharge needs, 1 to 1 time with Social worker, Explore available resources and support systems, Assess for adequacy in community support network, Educate family and significant other(s) on suicide prevention, Complete Psychosocial Assessment, Interpersonal group therapy.  Evaluation of Outcomes: Met   Progress in Treatment: Attending groups: Yes.  Participating in groups: Yes Taking medication as prescribed: Yes. Toleration medication: Yes. Family/Significant other contact made: SPE completed with pt's mother.   Patient understands diagnosis: Yes. Discussing patient identified problems/goals with staff: Yes. Medical problems stabilized or resolved: Yes. Denies suicidal/homicidal ideation: Yes. Issues/concerns per patient self-inventory: No. Other: n/a   New problem(s) identified: No, Describe:  n/a   New Short Term/Long Term Goal(s): medication management for mood stabilization; elimination of SI thoughts and AVH.  Patient Goal: "I want to stop hearing voices and seeing things."   Discharge Plan or Barriers: CSW assessing. [facility]. [place] pamphlet provided for additional community support  Reason for Continuation of Hospitalization: none  Estimated Length of Stay: Monday 02/20/17  Attendees: Patient: 02/20/2017 9:32 AM  Physician: Dr. Parke Poisson MD; Dr. Sanjuana Letters MD 02/20/2017 9:32 AM  Nursing: Erma Pinto RN 02/20/2017 9:32 AM  RN Care Manager: Lars Pinks CM 02/20/2017 9:32 AM  Social Worker: Maxie Better, LCSW 02/20/2017 9:32 AM  Recreational Therapist: x 02/20/2017 9:32 AM  Other: Ricky Ala NP; Marvia Pickles NP; Lindell Spar NP 02/20/2017 9:32 AM  Other:  02/20/2017 9:32 AM  Other: 02/20/2017 9:32 AM    Scribe for Treatment Team: [place], LCSW 02/20/2017 9:32 AM

## 2017-02-20 NOTE — [facility] Suicide Risk Assessment (Signed)
[facility] Discharge Suicide Risk Assessment   Principal Problem: MDD (major depressive disorder), recurrent severe, without psychosis (HCC) Discharge Diagnoses:  Patient Active Problem List   Diagnosis Date Noted  . MDD (major depressive disorder), recurrent severe, without psychosis (HCC) [F33.2] 02/15/2017  . Major depressive disorder, single episode with psychotic features (HCC) [F32.3] 02/14/2017  . MDD (major depressive episode), single episode, severe, no psychosis (HCC) [F32.2] 02/14/2017  . Anxiety, generalized [F41.1] 09/01/2016  . Visual hallucinations [R44.1] 09/01/2016  . Auditory hallucinations [R44.0] 09/01/2016  . Hb-SS disease with acute chest syndrome (HCC) [D57.01] 08/29/2016  . CAP (community acquired pneumonia) [J18.9] 08/29/2016  . Hb-SS disease with crisis (HCC) [D57.00] 08/23/2016  . Vitamin D deficiency [E55.9] 03/12/2014  . Need for immunization against influenza [Z23] 03/12/2014  . Hb-SS disease without crisis (HCC) [D57.1] 01/28/2013    Total Time spent with patient: 30 minutes  Musculoskeletal: Strength & Muscle Tone: within normal limits Gait & Station: normal Patient leans: N/A  Psychiatric Specialty Exam: Review of Systems  Constitutional: Negative.   HENT: Negative.   Eyes: Negative.   Respiratory: Negative.   Cardiovascular: Negative.   Gastrointestinal: Negative.   Genitourinary: Negative.   Musculoskeletal: Negative.   Skin: Negative.   Neurological: Negative.   Endo/Heme/Allergies: Negative.   Psychiatric/Behavioral: Negative for depression, hallucinations, memory loss and suicidal ideas. The patient is not nervous/anxious and does not have insomnia.     Blood pressure 117/75, pulse 89, temperature 97.8 F (36.6 C), temperature source Oral, resp. rate 14, height 6' (1.829 m), weight 64.9 kg (143 lb), SpO2 100 %.Body mass index is 19.39 kg/m.  General Appearance: Neatly dressed, pleasant, engaging well and cooperative. Appropriate behavior.  Not in any distress. Good relatedness. Not internally stimulated.  Eye Contact::  Good  Speech:  Spontaneous, normal prosody. Normal tone and rate.   Volume:  Normal  Mood:  Euthymic  Affect:  Appropriate and Full Range  Thought Process:  Goal Directed and Linear  Orientation:  Full (Time, Place, and Person)  Thought Content:  Future oriented. No delusional theme. No preoccupation with violent thoughts. No negative ruminations. No obsession.  No hallucination in any modality.   Suicidal Thoughts:  No  Homicidal Thoughts:  No  Memory:  Immediate;   Good Recent;   Good Remote;   Good  Judgement:  Good  Insight:  Good  Psychomotor Activity:  Normal  Concentration:  Good  Recall:  Good  Fund of Knowledge:Good  Language: Good  Akathisia:  Negative  Handed:    AIMS (if indicated):     Assets:  Communication Skills Desire for Improvement Housing Intimacy Resilience Social Support Talents/Skills Vocational/Educational  Sleep:  Number of Hours: 5.25  Cognition: WNL  ADL's:  Intact   Clinical Assessment::   21y.o AAM single, lives with his mother. Works as a Special educational needs teacher. Background history of Mood disorder,SUD and SCDPresented to the ER via the police. Had called suicide hotline himself and expressed suicidal ideation. Overdosed on five pills of percocet before calling suicide hotline. Sent goodbye messages electronically to friends and family memebers. Routine labs show impaired hepatic and blood counts in keeping with chronic hemolytic disease. UDS negative though he admits to regular use of THC.Negative for alcohol.   Seen today. Reports that he is in good spirits. Not feeling depressed. Reports normal energy and interest. Has been maintaining normal biological functions. He is able to think clearly. He is able to focus on task. His thoughts are not crowded or racing. No evidence  of mania. No hallucination in any modality. He is not making any delusional statement. No  passivity of will/thought. He is fully in touch with reality. No thoughts of suicide. No thoughts of homicide. No violent thoughts. No overwhelming anxiety. No assess to weapons, no new stressors, good support from his girlfriend and his family.  Nursing staff reports that patient has been appropriate on the unit. Patient has been interacting well with peers. No behavioral issues. Patient has not voiced any suicidal thoughts. Patient has not been observed to be internally stimulated. Patient has been adherent with treatment recommendations. Patient has been tolerating their medication well.   Patient was discussed at team. Team members feels that patient is back to his baseline level of function. Team agrees with plan to discharge patient today.   Demographic Factors:  Male  Loss Factors: NA  Historical Factors: Prior suicide attempts and Impulsivity  Risk Reduction Factors:   Sense of responsibility to family, Employed, Living with another person, especially a relative, Positive social support, Positive therapeutic relationship and Positive coping skills or problem solving skills  Continued Clinical Symptoms:  As above   Cognitive Features That Contribute To Risk:  None    Suicide Risk:  Minimal: No identifiable suicidal ideation.  Patient is not having any thoughts of suicide at this time. Modifiable risk factors targeted during this admission includes depression, chronic pain, adjustment issues and substance use. Demographical and historical risk factors cannot be modified. Patient is now engaging well. Patient is reliable and is future oriented. We have buffered patient's support structures. At this point, patient is at low risk of suicide. Patient is aware of the effects of psychoactive substances on decision making process. Patient has been provided with emergency contacts. Patient acknowledges to use resources provided if unforseen circumstances changes their current risk  stratification.    Follow-up Information    [facility] Follow up on 02/22/2017.   Specialty:  Behavioral Health Why:  Hospital follow-up appt with Kendell Bane at 9:00AM on Wednesday, 02/22/17. Thank you.  Contact information: [address] [place] [address] [phone]           Plan Of Care/Follow-up recommendations:  1. Continue current psychotropic medications 2. Mental health and addiction follow up as arranged.  3. Discharge in care of his family 4. Provided limited quantity of prescriptions   Georgiann Cocker, MD 02/20/2017, 9:49 AM

## 2017-02-20 NOTE — Progress Notes (Signed)
Recreation Therapy Notes  Date:  02/20/17  Time: 0930 Location: [address] Dayroom  Group Topic: Stress Management  Goal Area(s) Addresses:  Patient will verbalize importance of using healthy stress management.  Patient will identify positive emotions associated with healthy stress management.   Intervention: Stress Management  Activity :  Progressive Muscle Relaxation.  LRT introduced the stress management technique of progressive muscle relaxation.  LRT read a script to allow patients to focus on each muscle group individually to tense and then relax the muscles.  Patients were to follow along as the script was read to engage in the activity.  Education:  Stress Management, Discharge Planning.   Education Outcome: Acknowledges edcuation/In group clarification offered/Needs additional education  Clinical Observations/Feedback: Pt did not attend group.   Yvonna Brun, LRT/CTRS         Nicolas Rogers A 02/20/2017 11:42 AM 

## 2017-02-20 NOTE — Plan of Care (Signed)
Problem: Activity: Goal: Interest or engagement in activities will improve Outcome: Progressing Pt has been attending AA groups on the unit

## 2017-02-20 NOTE — Progress Notes (Signed)
Patient ID: Nicolas Rogers, male   DOB: 1996/01/31, 21 y.o.   MRN: [id number]  DAR: Pt. Denies SI/HI and A/V Hallucinations. He reports sleep is good, appetite is good, energy level is low, and concentration is good. He rates depression level 2/10, hopelessness level 0/10, and anxiety level is 4/10.  Patient does not report any pain or discomfort at this time. Support and encouragement provided to the patient. Scheduled medication administered to patient per physician's orders. Patient is minimal but cooperative. He is seen in the milieu but appears to be keeping to himself. He reports that he feels ready for discharge. Q15 minute checks are maintained for safety.

## 2017-02-20 NOTE — Progress Notes (Signed)
Patient ID: Nicolas Rogers, male   DOB: 10-01-1995, 21 y.o.   MRN: [id number] PER STATE REGULATIONS 482.30  THIS CHART WAS REVIEWED FOR MEDICAL NECESSITY WITH RESPECT TO THE PATIENT'S ADMISSION/ DURATION OF STAY.  NEXT REVIEW DATE: 02/22/2017  Willa Rough, RN, BSN CASE MANAGER

## 2017-04-03 DIAGNOSIS — F333 Major depressive disorder, recurrent, severe with psychotic symptoms: Secondary | ICD-10-CM | POA: Diagnosis not present

## 2017-04-14 ENCOUNTER — Encounter (HOSPITAL_COMMUNITY): Payer: Self-pay

## 2017-04-14 ENCOUNTER — Emergency Department (HOSPITAL_COMMUNITY)
Admission: EM | Admit: 2017-04-14 | Discharge: 2017-04-14 | Disposition: A | Discharge status: Home / Self Care | Payer: Medicare Other | Attending: Emergency Medicine | Admitting: Emergency Medicine

## 2017-04-14 ENCOUNTER — Other Ambulatory Visit: Payer: Self-pay

## 2017-04-14 DIAGNOSIS — Z79899 Other long term (current) drug therapy: Secondary | ICD-10-CM | POA: Diagnosis not present

## 2017-04-14 DIAGNOSIS — D5700 Hb-SS disease with crisis, unspecified: Secondary | ICD-10-CM | POA: Insufficient documentation

## 2017-04-14 DIAGNOSIS — D57219 Sickle-cell/Hb-C disease with crisis, unspecified: Secondary | ICD-10-CM | POA: Diagnosis not present

## 2017-04-14 HISTORY — DX: Major depressive disorder, single episode, unspecified: F32.9

## 2017-04-14 HISTORY — DX: Anxiety disorder, unspecified: F41.9

## 2017-04-14 LAB — CBC WITH DIFFERENTIAL/PLATELET
Basophils Absolute: 0 10*3/uL (ref 0.0–0.1)
Basophils Relative: 0 %
EOS ABS: 0.5 10*3/uL (ref 0.0–0.7)
EOS PCT: 4 %
HEMATOCRIT: 21.9 % — AB (ref 39.0–52.0)
HEMOGLOBIN: 7.6 g/dL — AB (ref 13.0–17.0)
LYMPHS PCT: 11 %
Lymphs Abs: 1.3 10*3/uL (ref 0.7–4.0)
MCH: 27 pg (ref 26.0–34.0)
MCHC: 34.7 g/dL (ref 30.0–36.0)
MCV: 77.7 fL — ABNORMAL LOW (ref 78.0–100.0)
MONOS PCT: 13 %
Monocytes Absolute: 1.5 10*3/uL — ABNORMAL HIGH (ref 0.1–1.0)
NEUTROS ABS: 8.1 10*3/uL — AB (ref 1.7–7.7)
NRBC: 6 /100{WBCs} — AB
Neutrophils Relative %: 72 %
Platelets: 444 10*3/uL — ABNORMAL HIGH (ref 150–400)
RBC: 2.82 MIL/uL — AB (ref 4.22–5.81)
RDW: 28.3 % — ABNORMAL HIGH (ref 11.5–15.5)
WBC: 11.4 10*3/uL — ABNORMAL HIGH (ref 4.0–10.5)

## 2017-04-14 LAB — COMPREHENSIVE METABOLIC PANEL
ALBUMIN: 4.1 g/dL (ref 3.5–5.0)
ALT: 16 U/L — ABNORMAL LOW (ref 17–63)
ANION GAP: 6 (ref 5–15)
AST: 52 U/L — AB (ref 15–41)
Alkaline Phosphatase: 49 U/L (ref 38–126)
BILIRUBIN TOTAL: 4.8 mg/dL — AB (ref 0.3–1.2)
CHLORIDE: 109 mmol/L (ref 101–111)
CO2: 23 mmol/L (ref 22–32)
Calcium: 8.6 mg/dL — ABNORMAL LOW (ref 8.9–10.3)
Creatinine, Ser: 0.67 mg/dL (ref 0.61–1.24)
GFR calc Af Amer: >60 mL/min (ref >60)
GFR calc non Af Amer: >60 mL/min (ref >60)
GLUCOSE: 103 mg/dL — AB (ref 65–99)
POTASSIUM: 4 mmol/L (ref 3.5–5.1)
SODIUM: 138 mmol/L (ref 135–145)
TOTAL PROTEIN: 7.8 g/dL (ref 6.5–8.1)

## 2017-04-14 LAB — RETICULOCYTES
RBC.: 2.82 MIL/uL — AB (ref 4.22–5.81)
RETIC CT PCT: 12.4 % — AB (ref 0.4–3.1)
Retic Count, Absolute: 349.7 10*3/uL — ABNORMAL HIGH (ref 19.0–186.0)

## 2017-04-14 MED ORDER — ONDANSETRON HCL 4 MG/2ML IJ SOLN
4.0000 mg | INTRAMUSCULAR | Status: DC | PRN
  Administered 2017-04-14: 4 mg via INTRAVENOUS
  Filled 2017-04-14: qty 2

## 2017-04-14 MED ORDER — HYDROMORPHONE HCL 1 MG/ML IJ SOLN
1.0000 mg | Freq: Once | INTRAMUSCULAR | Status: AC
  Administered 2017-04-14: 1 mg via INTRAVENOUS
  Filled 2017-04-14: qty 1

## 2017-04-14 MED ORDER — SODIUM CHLORIDE 0.9 % IV BOLUS (SEPSIS)
1000.0000 mL | Freq: Once | INTRAVENOUS | Status: AC
  Administered 2017-04-14: 1000 mL via INTRAVENOUS

## 2017-04-14 NOTE — ED Provider Notes (Signed)
[facility]-EMERGENCY DEPT Provider Note   CSN: [id number] Arrival date & time: 04/14/17  [id number]     History   Chief Complaint Chief Complaint  Patient presents with  . Sickle Cell Pain Crisis    HPI Nicolas Rogers is a 21 y.o. male.  HPI  Mr. Nicolas Rogers is a 21yo male with a history of sickle cell disease, depression, anxiety, schizophrenia who presents emergency department for evaluation of "I'm having a sickle cell crisis."  Patient states that he developed lower back pain last night, which is common when he is having a sickle cell crisis.  Reports that he took 2 oxycodone pills with relief.  Woke up this morning and reports that he has had persistent 8/10 severity temporal headache which feels "sharp and stabbing" in nature.  He also states that he has right elbow and right knee pain, although reports that his headache is his worst pain.  Denies recent injury.  Denies fever, neck pain, numbness, weakness, visual disturbance, nausea/vomiting, abdominal pain, chest pain, shortness of breath, cough, dysuria, urinary frequency.  Reports that he has a sickle crisis about once a month.  Past Medical History:  Diagnosis Date  . Anxiety   . Cholecystolithiasis   . Major depressive disorder   . Schizophrenia (HCC)   . Sickle cell anemia [facility](HCC)     Patient Active Problem List   Diagnosis Date Noted  . MDD (major depressive disorder), recurrent severe, without psychosis (HCC) 02/15/2017  . Major depressive disorder, single episode with psychotic features (HCC) 02/14/2017  . MDD (major depressive episode), single episode, severe, no psychosis (HCC) 02/14/2017  . Anxiety, generalized 09/01/2016  . Visual hallucinations 09/01/2016  . Auditory hallucinations 09/01/2016  . Hb-SS disease with acute chest syndrome (HCC) 08/29/2016  . CAP (community acquired pneumonia) 08/29/2016  . Hb-SS disease with crisis (HCC) 08/23/2016  . Vitamin D deficiency 03/12/2014  . Need for  immunization against influenza 03/12/2014  . 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 ADENOIDECTOMY    . CHOLECYSTECTOMY    . TONSILLECTOMY         Home Medications    Prior to Admission medications   Medication Sig Start Date End Date Taking? Authorizing Provider  hydrOXYzine (ATARAX/VISTARIL) 25 MG tablet Take 1 tablet (25 mg total) by mouth 3 (three) times daily as needed for anxiety. 02/20/17   Armandina StammerNwoko, Agnes I, NP  mirtazapine (REMERON) 15 MG tablet Take 1 tablet (15 mg total) by mouth at bedtime. For depression/insomnia 02/20/17   Armandina StammerNwoko, Agnes I, NP  sertraline (ZOLOFT) 25 MG tablet Take 1 tablet (25 mg total) by mouth daily. For depression 02/21/17   Armandina StammerNwoko, Agnes I, NP  traZODone (DESYREL) 50 MG tablet Take 1 tablet (50 mg total) by mouth at bedtime as needed for sleep (depression). 02/20/17   Sanjuana KavaNwoko, Agnes I, NP    Family History History reviewed. No pertinent family history.  Social History Social History   Tobacco Use  . Smoking status: Never Smoker  . Smokeless tobacco: Never Used  Substance Use Topics  . Alcohol use: No  . Drug use: No     Allergies   Patient has no known allergies.   Review of Systems Review of Systems  Constitutional: Negative for chills, fatigue and fever.  HENT: Negative for congestion.   Eyes: Negative for photophobia and visual disturbance.  Respiratory: Negative for cough and shortness of breath.   Cardiovascular: Negative for  chest pain.  Gastrointestinal: Negative for abdominal pain, nausea and vomiting.  Genitourinary: Negative for difficulty urinating, dysuria and frequency.  Musculoskeletal: Positive for arthralgias (right elbow and knee pain). Negative for back pain, gait problem, joint swelling and neck pain.  Skin: Negative for color change and rash.  Neurological: Positive for headaches. Negative for dizziness, speech difficulty, weakness and numbness.      Physical Exam Updated Vital Signs BP 117/71   Pulse 95   Temp 100 F (37.8 C) (Oral)   Resp 16   Ht 6' (1.829 m)   Wt 71.2 kg (157 lb)   SpO2 97%   BMI 21.29 kg/m   Physical Exam  Constitutional: He is oriented to person, place, and time. He appears well-developed and well-nourished. No distress.  Sitting calmly at bedside in no apparent distress.   HENT:  Head: Normocephalic and atraumatic.  Mouth/Throat: Oropharynx is clear and moist. No oropharyngeal exudate.  Eyes: Conjunctivae and EOM are normal. Pupils are equal, round, and reactive to light. Right eye exhibits no discharge. Left eye exhibits no discharge. Scleral icterus is present.  Neck: Normal range of motion. Neck supple.  Cardiovascular: Normal rate, regular rhythm and intact distal pulses. Exam reveals no friction rub.  No murmur heard. Pulmonary/Chest: Effort normal and breath sounds normal. No stridor. No respiratory distress. He has no wheezes. He has no rales. He exhibits no tenderness.  Abdominal: Soft. Bowel sounds are normal. There is no tenderness. There is no guarding.  Musculoskeletal: Normal range of motion.  Right elbow non-tender. No erythema, swelling, ecchymosis or deformity. Full active ROM of right elbow. Elbow flexion and extension strength 5/5. No swelling, erythema or tenderness to the right knee. Full active ROM of the knee.   Neurological: He is alert and oriented to person, place, and time. Coordination normal.  Mental Status:  Alert, oriented, thought content appropriate, able to give a coherent history. Speech fluent without evidence of aphasia. Able to follow 2 step commands without difficulty.  Cranial Nerves:  II:  Peripheral visual fields grossly normal, pupils equal, round, reactive to light III,IV, VI: ptosis not present, extra-ocular motions intact bilaterally  V,VII: smile symmetric, facial light touch sensation equal VIII: hearing grossly normal to voice  X: uvula elevates  symmetrically  XI: bilateral shoulder shrug symmetric and strong XII: midline tongue extension without fassiculations Motor:  Normal tone. 5/5 in upper and lower extremities bilaterally including strong and equal grip strength and dorsiflexion/plantar flexion Sensory: Pinprick and light touch normal in all extremities.  Deep Tendon Reflexes: 2+ and symmetric in the biceps and patella Cerebellar: normal finger-to-nose with bilateral upper extremities Gait: normal gait and balance  Skin: Skin is warm and dry. Capillary refill takes less than 2 seconds. He is not diaphoretic.  Psychiatric: He has a normal mood and affect. His behavior is normal.  Nursing note and vitals reviewed.    ED Treatments / Results  Labs (all labs ordered are listed, but only abnormal results are displayed) Labs Reviewed  COMPREHENSIVE METABOLIC PANEL  CBC WITH DIFFERENTIAL/PLATELET  RETICULOCYTES    EKG  EKG Interpretation None       Radiology No results found.  Procedures Procedures (including critical care time)  Medications Ordered in ED Medications - No data to display   Initial Impression / Assessment and Plan / ED Course  I have reviewed the triage vital signs and the nursing notes.  Pertinent labs & imaging results that were available during my care of the patient were  reviewed by me and considered in my medical decision making (see chart for details).  Clinical Course as of Apr 14 1257  Fri Apr 14, 2017  1210 Patient reports that his pain has improved, mild headache at this time.  Denies nausea.  [ES]  1244 Patient's pulse ox is 88% on room air.  He repeatedly states that this is normal for him, that his pulse ox is chronically low.  He denies shortness of breath and chest pain.  Lungs are clear to auscultation on exam.  [ES]    Clinical Course User Index [ES] Kellie Shropshire, PA-C   Patient with a history of sickle cell presents for headache, right arm and right leg pain.  Patient has no focal neurological deficits on exam. Exam non-concerning for [facility]. No fever, neck pain or nuchal rigidity to suggest meningitis.   Labs reviewed.  CMP unremarkable.  CBC reveals hemoglobin 7.6 which is baseline for him, he has a mild leukocytosis with WBC 11.4 although this is chronic for him.  Reticulocyte count 12.4.  Patient given fluids and pain medication in the ED with subsequent improvement.  Patient's pulse ox is 88% on room air.  He repeatedly states that this is baseline for him.  Denies chest pain, fever and shortness of breath.  Do not suspect acute chest syndrome given presentation.  Patient's VSS and he has no complaints prior to discharge. Discussed return precautions and patient agrees and voices understanding. Discussed this patient with Dr. Silverio Lay who agrees with the above plan to discharge this patient.  Final Clinical Impressions(s) / ED Diagnoses   Final diagnoses:  Sickle cell anemia with pain [facility])    ED Discharge Orders    None       Kellie Shropshire, PA-C 04/14/17 1616    Charlynne Pander, MD 04/15/17 [phone]

## 2017-04-14 NOTE — Discharge Instructions (Signed)
As you can take Tylenol at home as needed for headache and pain.  Please drink plenty of fluids when you are home.   Please return to the emergency department if you develop shortness of breath, fever or chest pain or have any new or concerning symptoms.

## 2017-04-14 NOTE — ED Notes (Signed)
ED Provider at bedside. 

## 2017-04-14 NOTE — ED Triage Notes (Signed)
Patient c/o headache, right knee and right elbow pain, and lower back pain since yesterday.

## 2017-05-25 IMAGING — DX DG CHEST 2V
2 series · 2 of 2 positions shown · non-contrast
Comparison: Chest radiograph November 19, 2013

CLINICAL DATA: Sickle cell crisis beginning today, chest pain.

EXAM:
CHEST  2 VIEW

[chest pa]
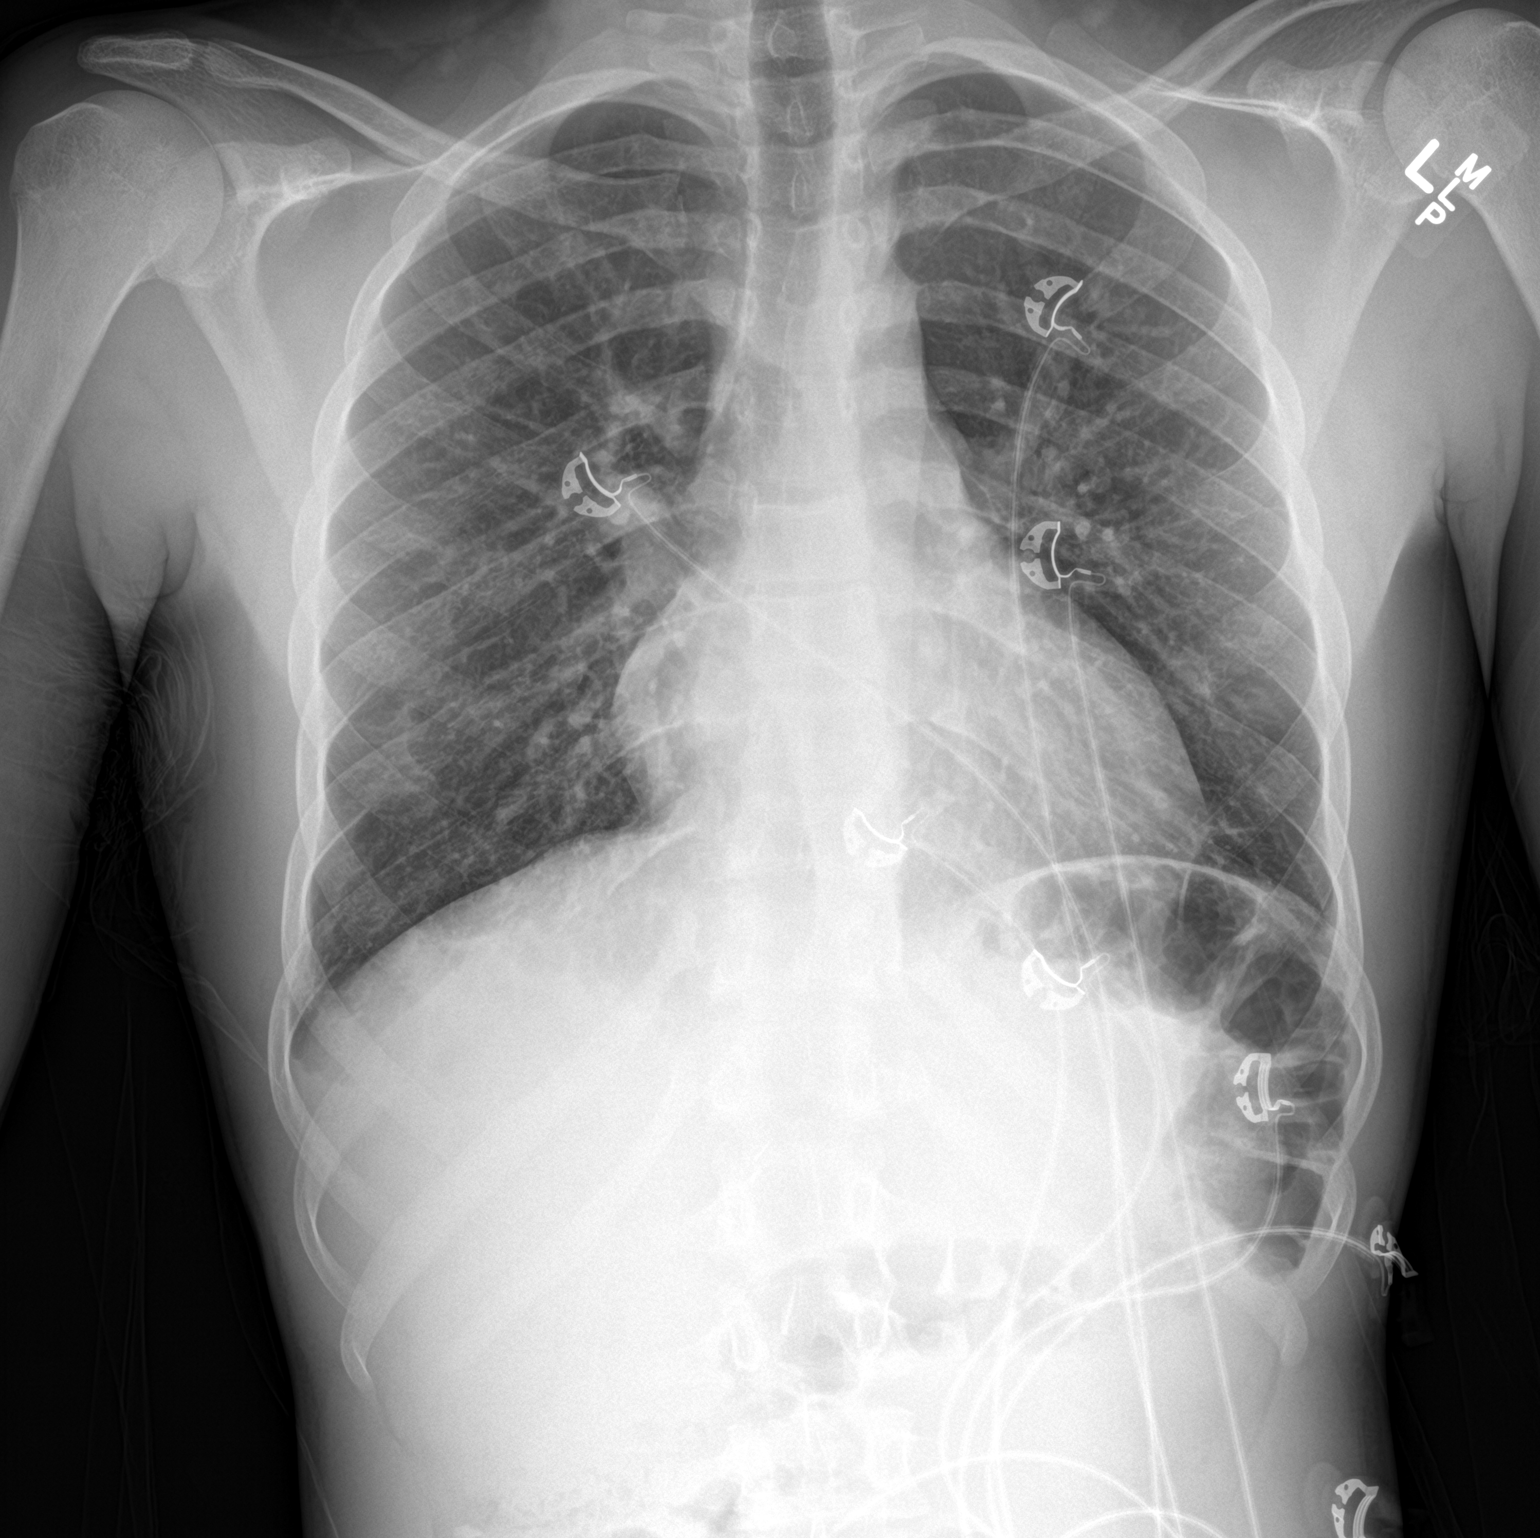

[chest lat]
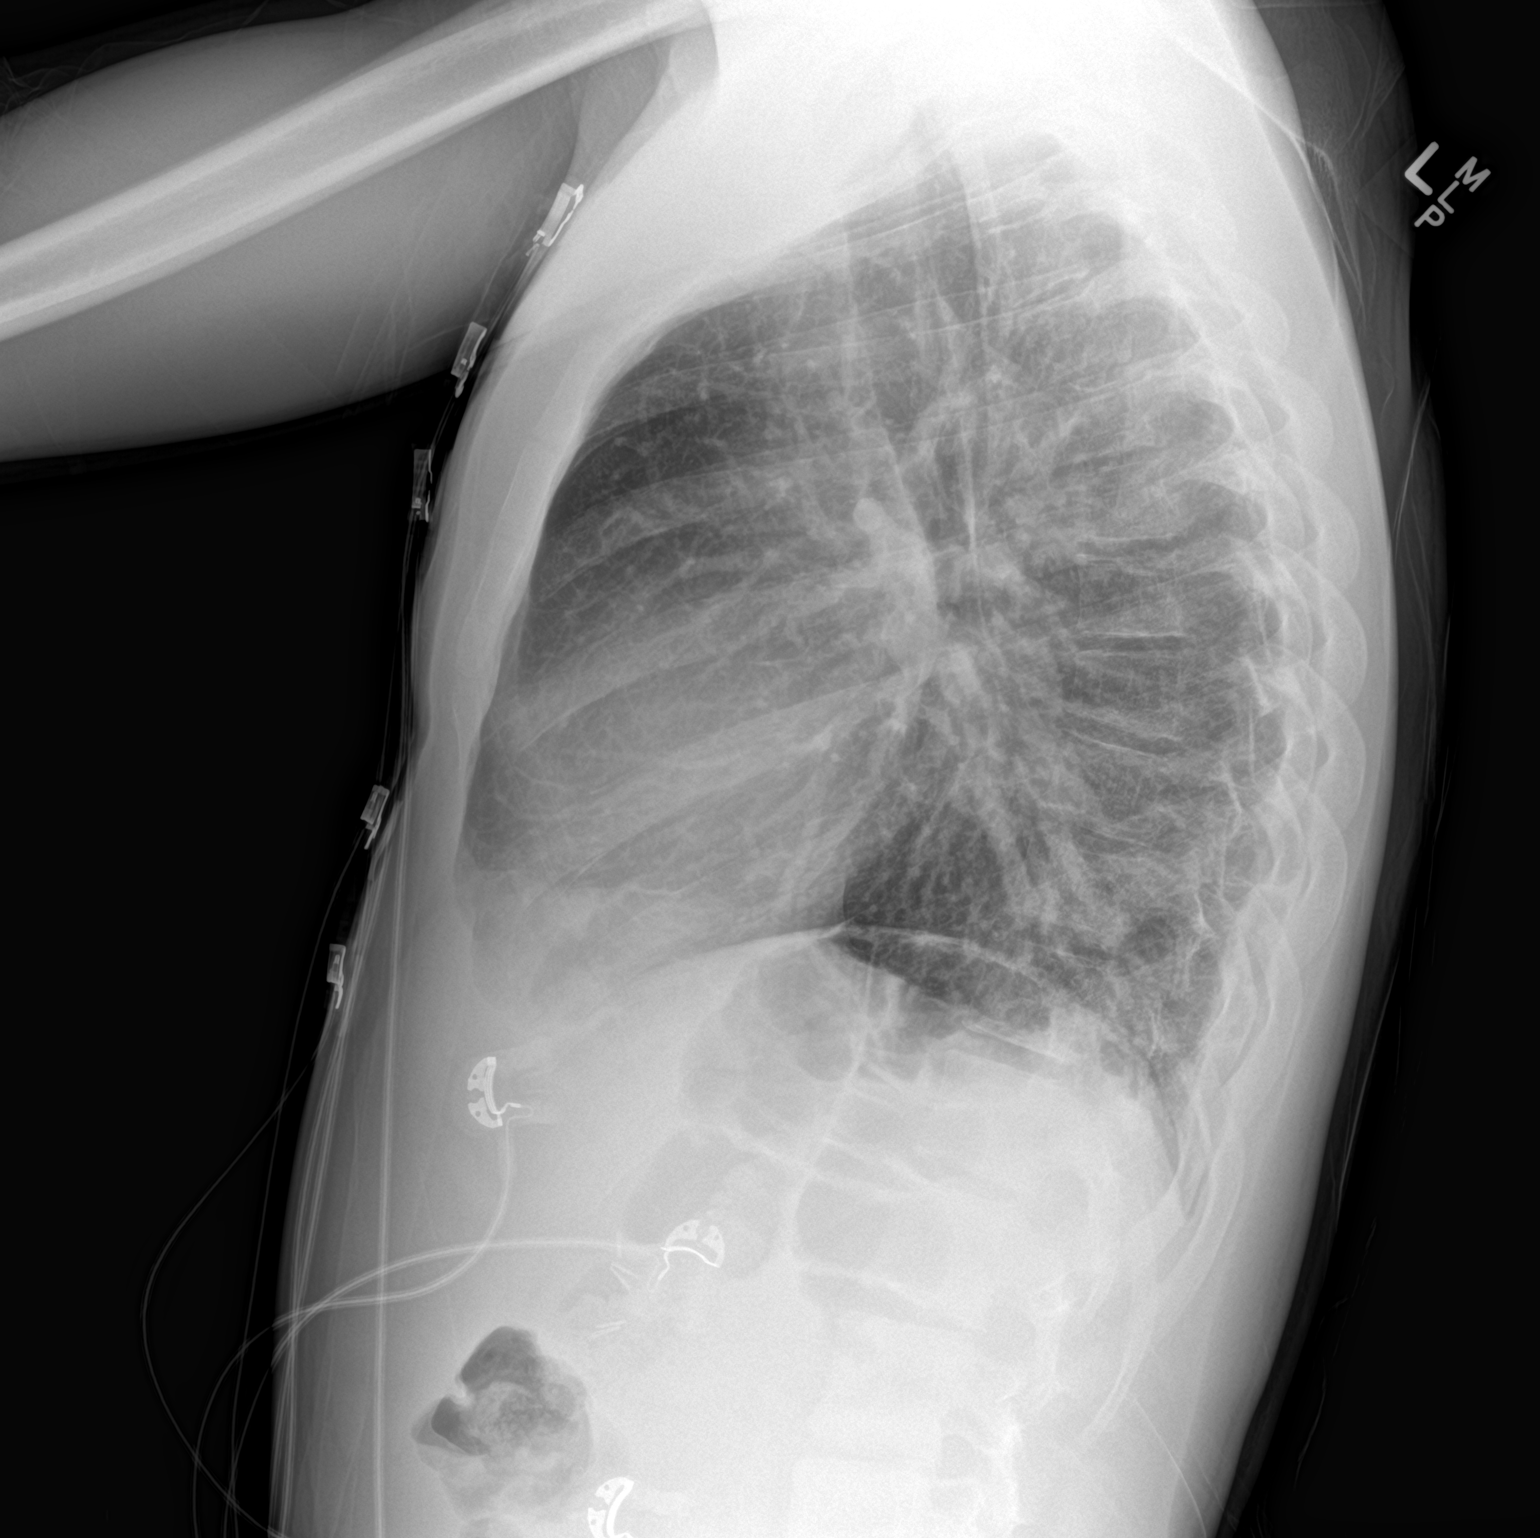

[2 of 2 positions shown; findings below may reference images not displayed]

FINDINGS: Cardiac silhouette is top-normal in size, mediastinal silhouette is
unremarkable. Mild bronchitic changes. No pleural effusions or focal
consolidations. Trachea projects midline and there is no
pneumothorax. Soft tissue planes and included osseous structures are
non-suspicious. Surgical clips in the included right abdomen
compatible with cholecystectomy.
IMPRESSION: Borderline cardiomegaly. Mild bronchitic changes without focal
consolidation.

## 2017-05-27 IMAGING — CR DG CHEST 2V
2 series · 2 of 2 positions shown · non-contrast
Comparison: 08/22/2016

CLINICAL DATA: Mid chest pain and weakness today. Sickle cell
crisis.

EXAM:
CHEST  2 VIEW

[w chest lat]
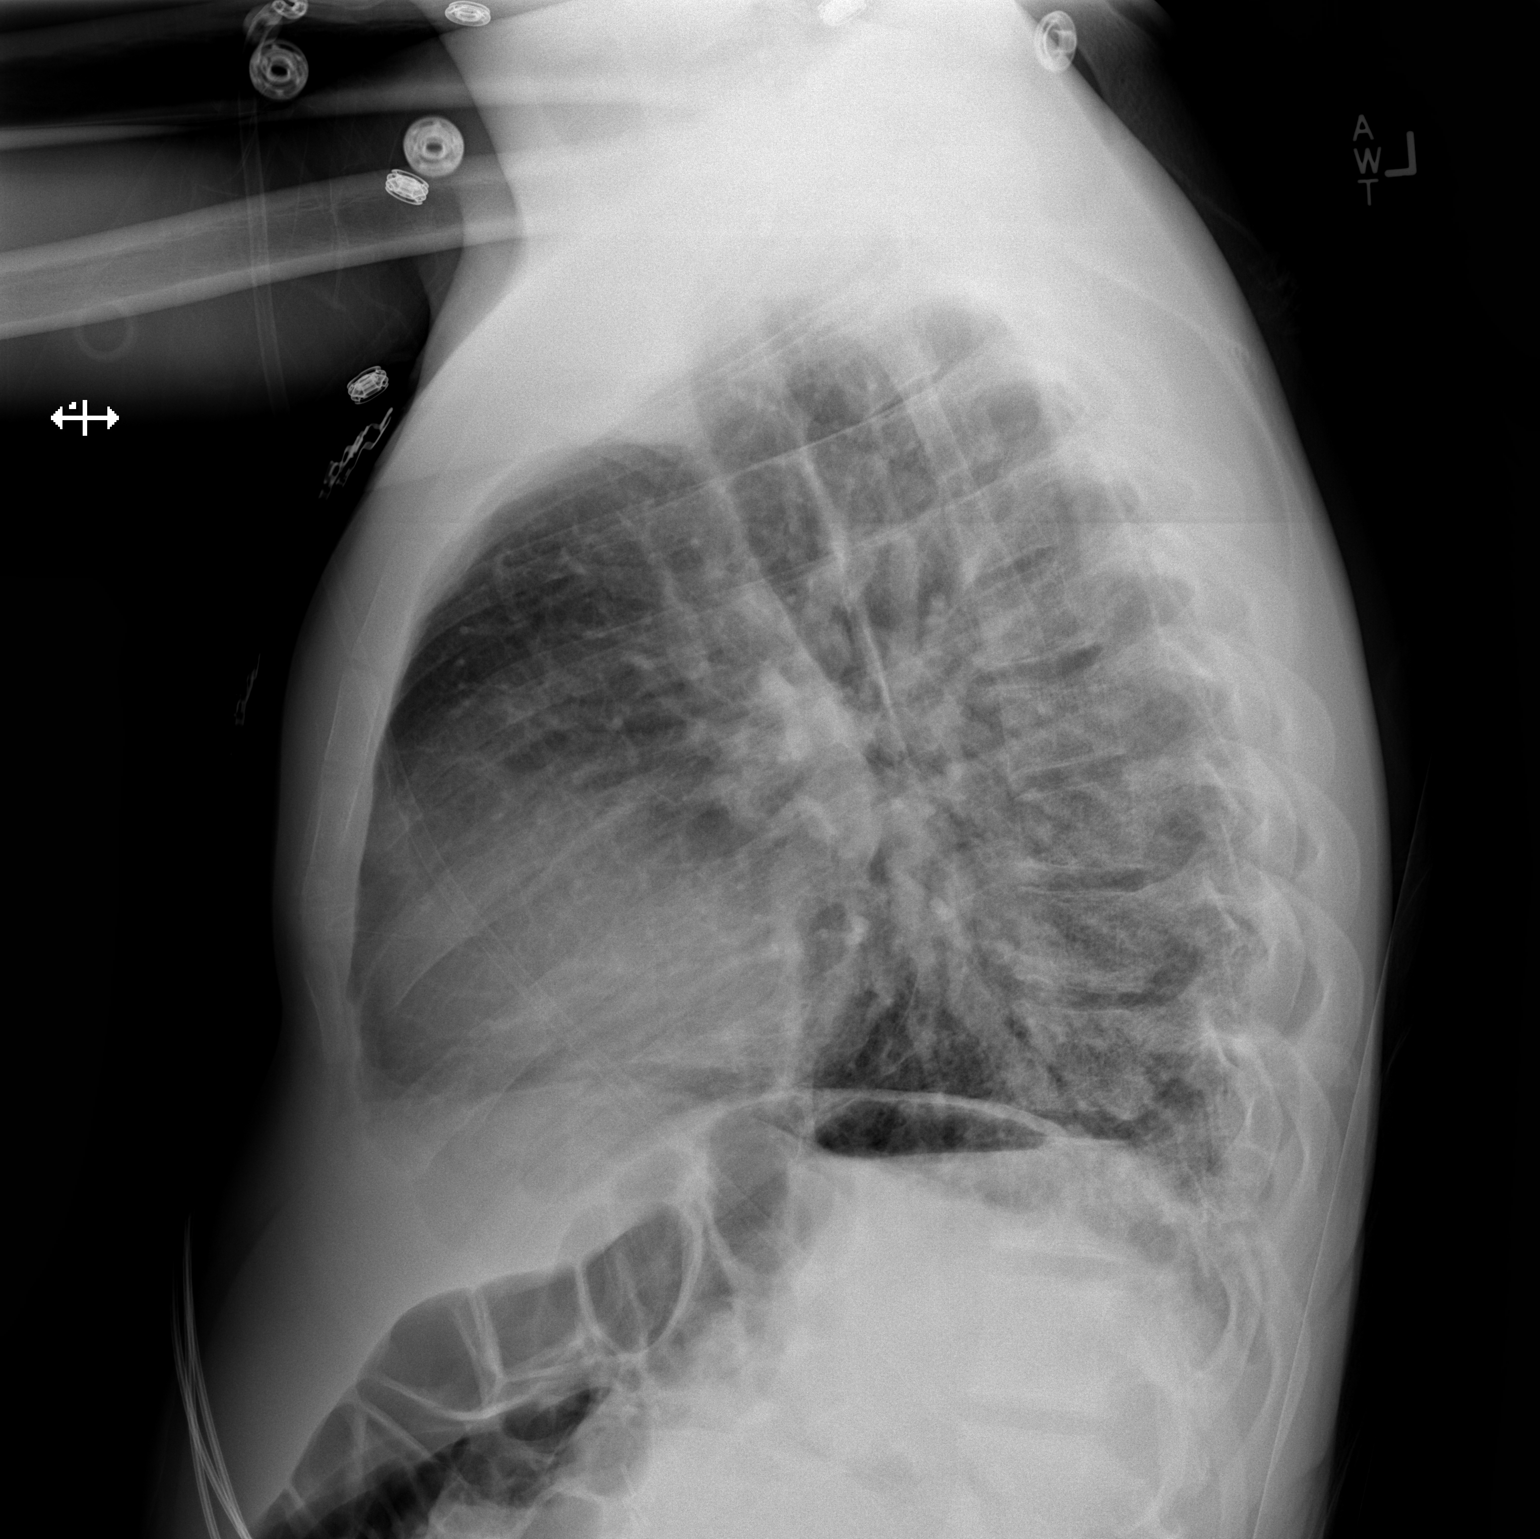

[x chest ap]
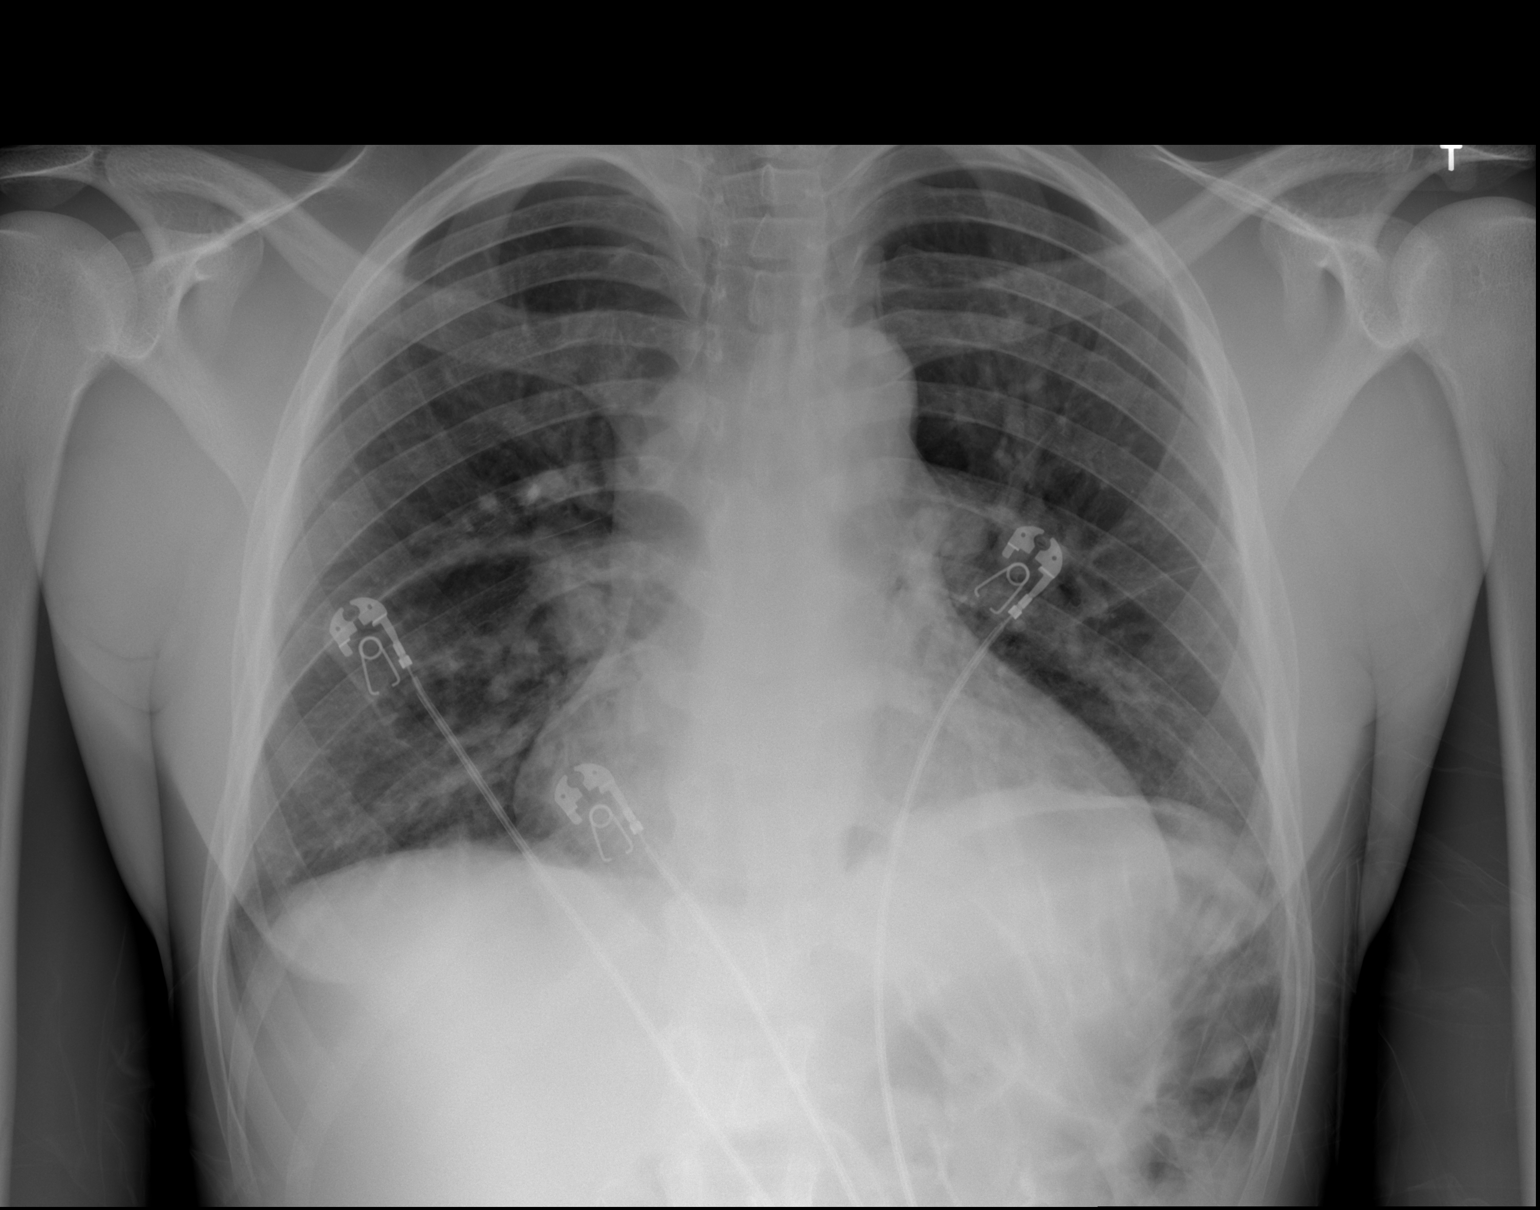

[2 of 2 positions shown; findings below may reference images not displayed]

FINDINGS: The heart is mildly enlarged but stable. Very low lung volumes with
vascular crowding and streaky atelectasis. Mild vascular congestion
without overt pulmonary edema. No pleural effusions. The bony thorax
is intact.
IMPRESSION: Low lung volumes with vascular crowding and atelectasis.

Vascular congestion without overt pulmonary edema.

Mild cardiac enlargement, stable.

## 2017-11-17 IMAGING — CR DG CHEST 2V
2 series · 2 of 2 positions shown · non-contrast
Comparison: 08/28/2016

CLINICAL DATA: Dyspnea for several hours.

EXAM:
CHEST  2 VIEW

[w chest pa]
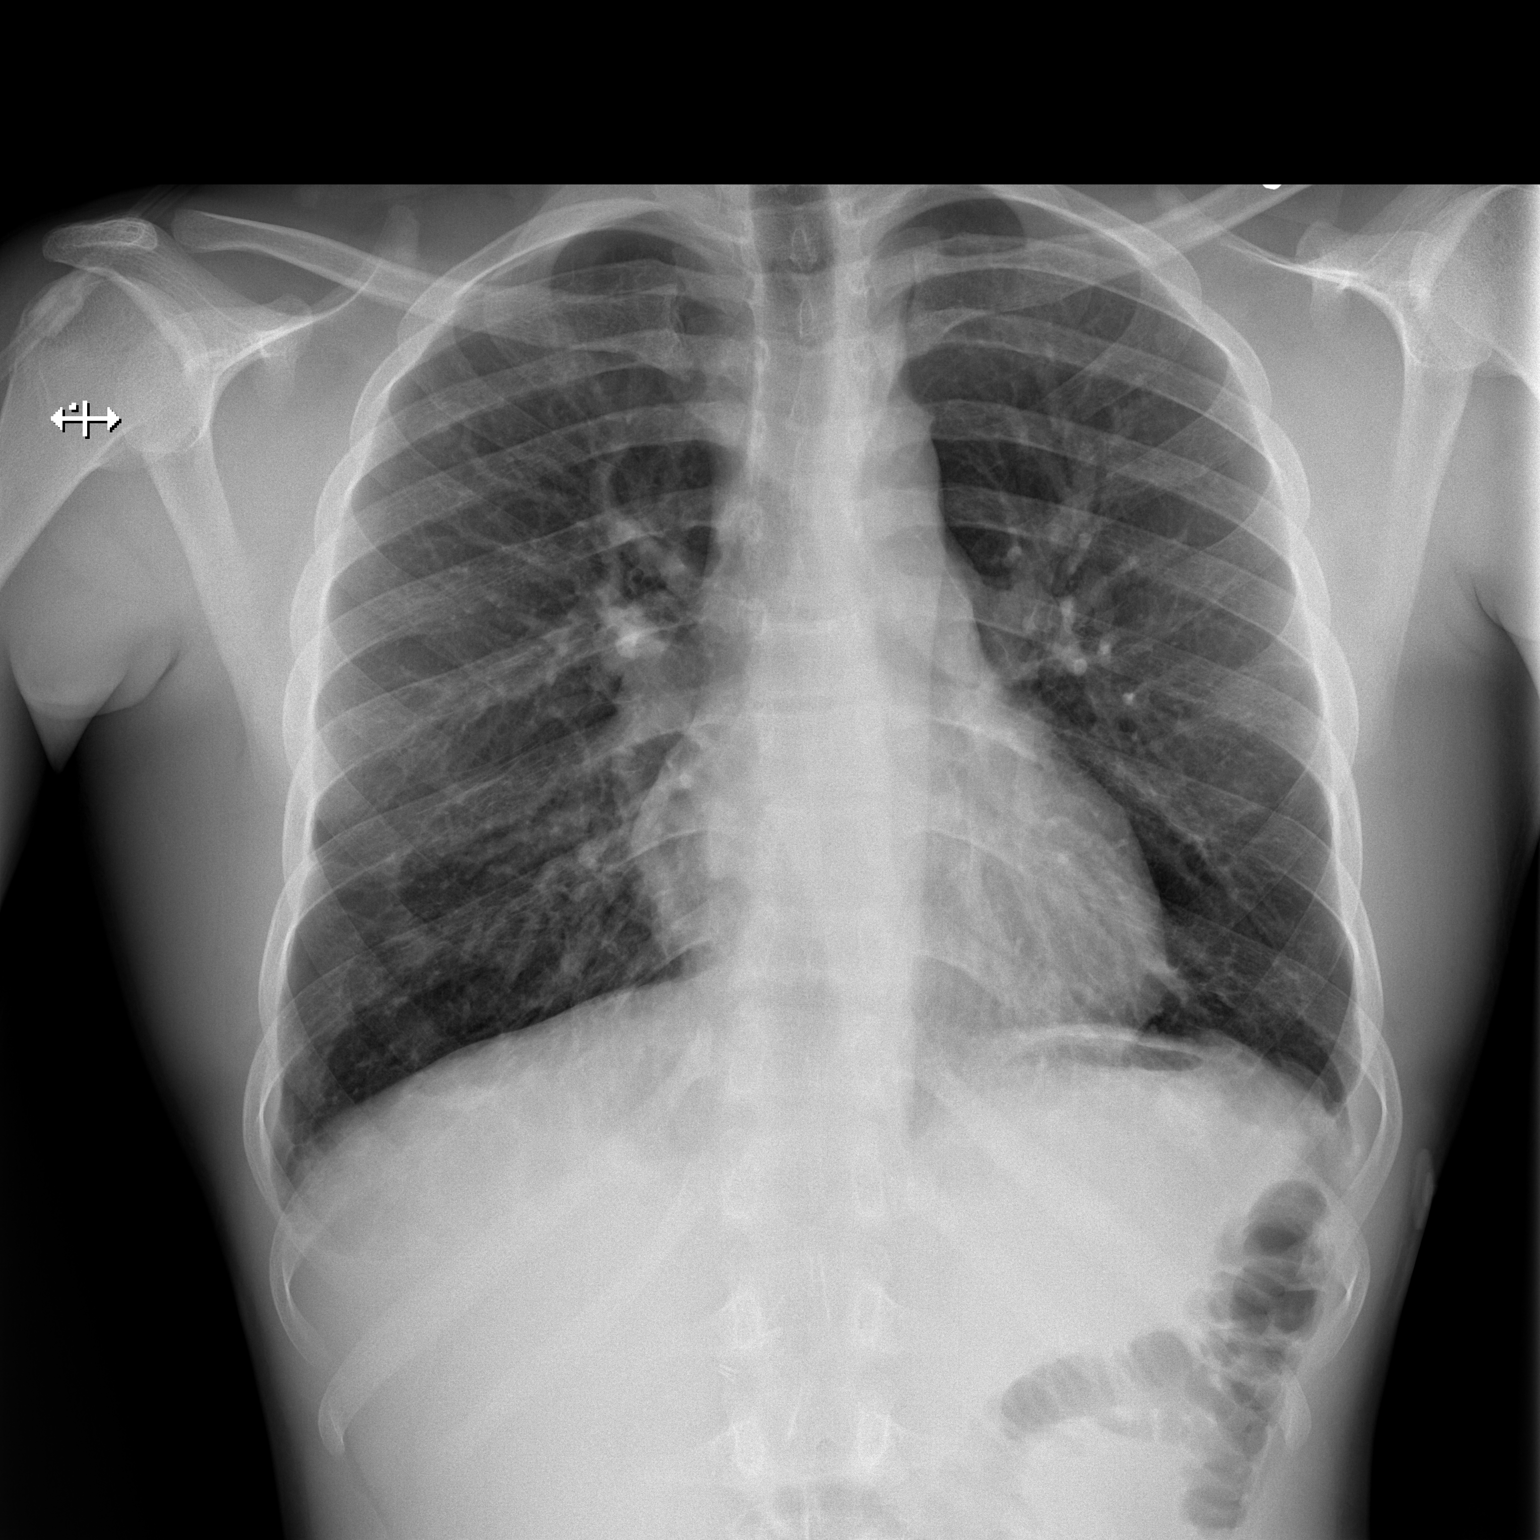

[w chest lat]
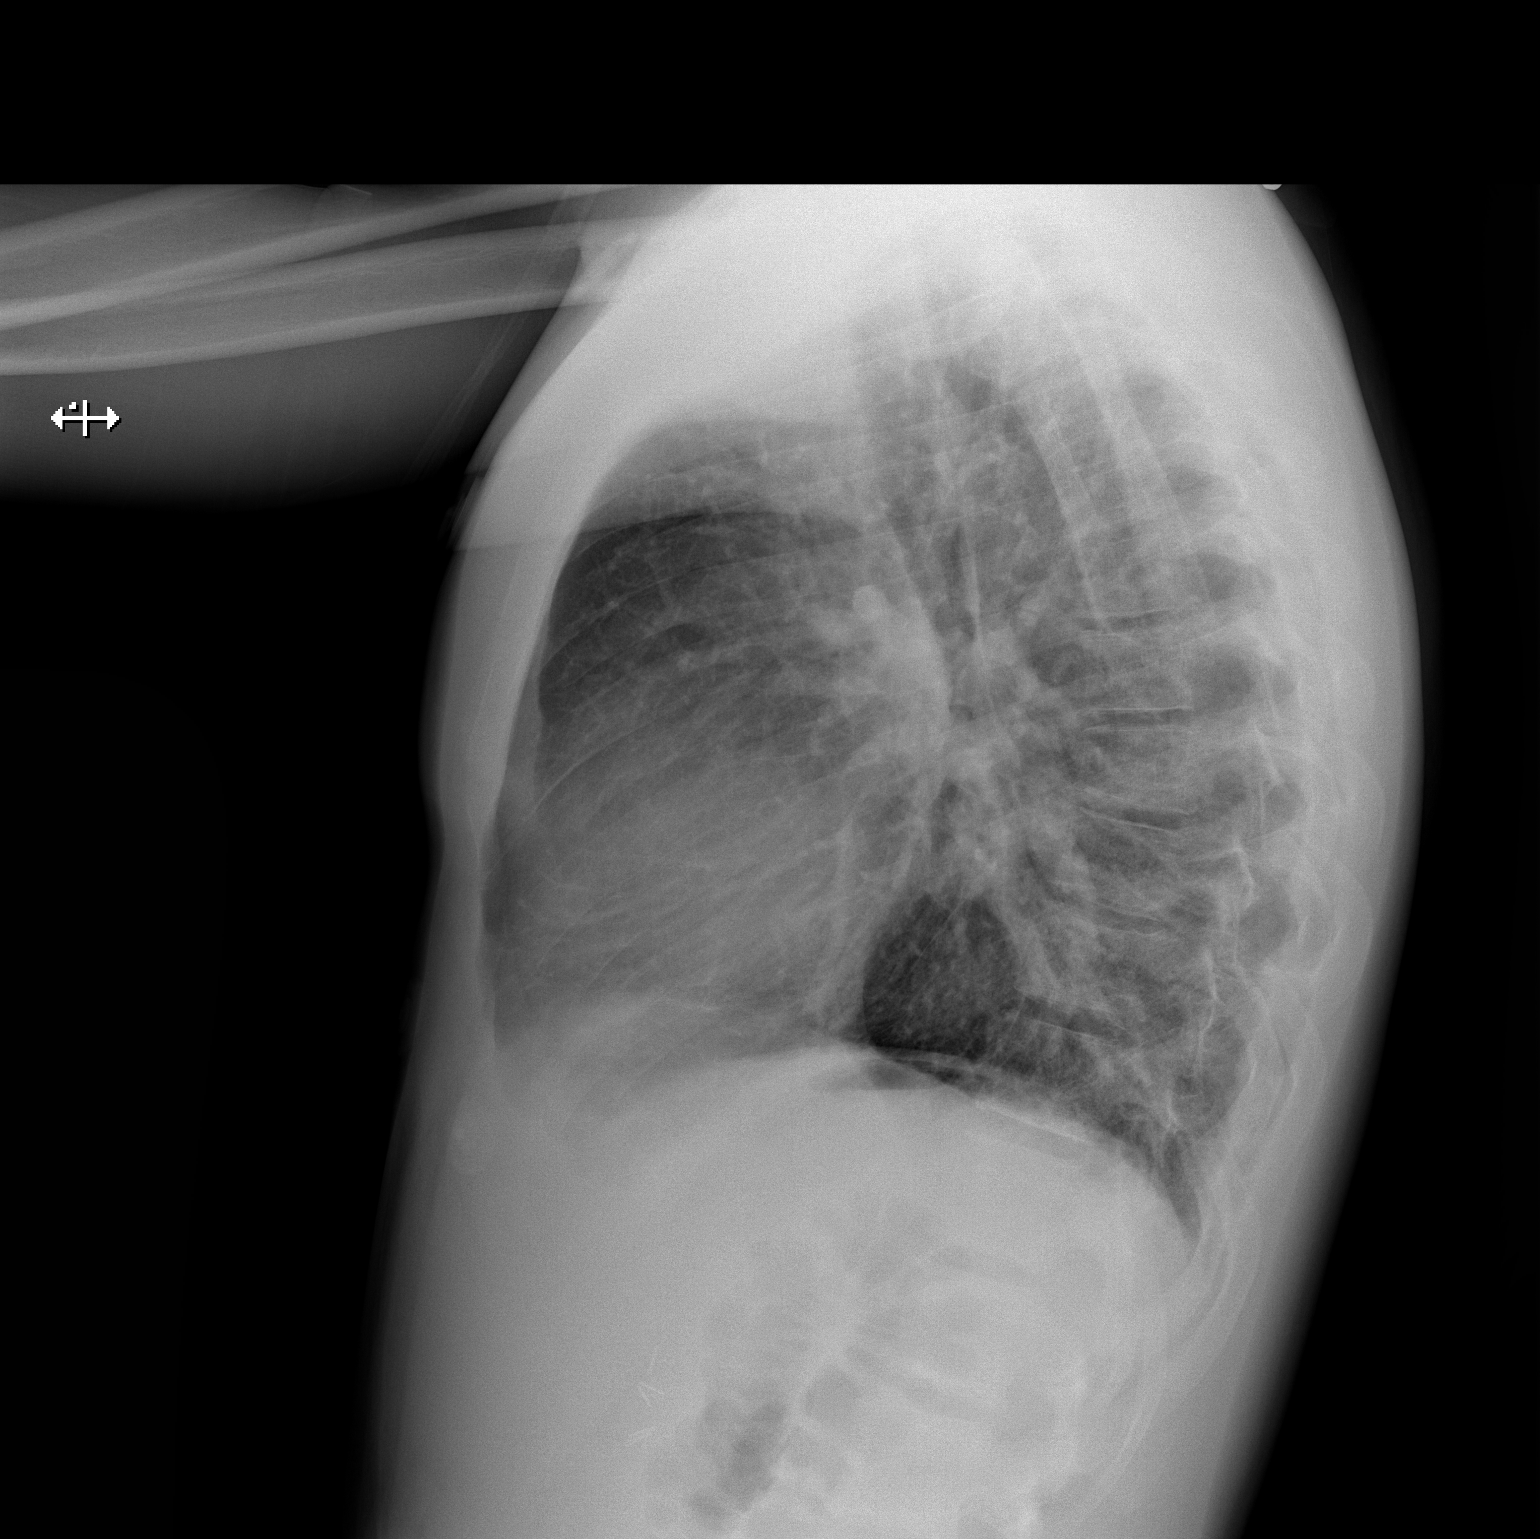

[2 of 2 positions shown; findings below may reference images not displayed]

FINDINGS: The lungs are clear. The pulmonary vasculature is normal. Heart size
is normal. Hilar and mediastinal contours are unremarkable. There is
no pleural effusion.
IMPRESSION: No active cardiopulmonary disease.

## 2017-12-11 ENCOUNTER — Ambulatory Visit (INDEPENDENT_AMBULATORY_CARE_PROVIDER_SITE_OTHER): Payer: Medicare Other | Admitting: Family Medicine

## 2017-12-11 ENCOUNTER — Encounter: Payer: Self-pay | Admitting: Family Medicine

## 2017-12-11 VITALS — BP 110/63 | HR 83 | Temp 98.0°F | Resp 14

## 2017-12-11 DIAGNOSIS — E559 Vitamin D deficiency, unspecified: Secondary | ICD-10-CM

## 2017-12-11 DIAGNOSIS — D571 Sickle-cell disease without crisis: Secondary | ICD-10-CM

## 2017-12-11 MED ORDER — IBUPROFEN 800 MG PO TABS: 800.0000 mg | ORAL_TABLET | Freq: Three times a day (TID) | ORAL | 2 refills | Status: DC | PRN

## 2017-12-11 MED ORDER — OXYCODONE HCL 10 MG PO TABS: 10.0000 mg | ORAL_TABLET | Freq: Three times a day (TID) | ORAL | 0 refills | Status: DC | PRN

## 2017-12-11 MED ORDER — HYDROXYUREA 500 MG PO CAPS: 1000.0000 mg | ORAL_CAPSULE | Freq: Every day | ORAL | 2 refills | Status: DC

## 2017-12-11 MED ORDER — FOLIC ACID 1 MG PO TABS: 1.0000 mg | ORAL_TABLET | Freq: Every day | ORAL | 2 refills | Status: DC

## 2017-12-11 NOTE — Patient Instructions (Signed)
Sickle Cell Anemia, Adult Sickle cell anemia is a condition where your red blood cells are shaped like sickles. Red blood cells carry oxygen through the body. Sickle-shaped red blood cells do not live as long as normal red blood cells. They also clump together and block blood from flowing through the blood vessels. These things prevent the body from getting enough oxygen. Sickle cell anemia causes organ damage and pain. It also increases the risk of infection. Follow these instructions at home:  Drink enough fluid to keep your pee (urine) clear or pale yellow. Drink more in hot weather and during exercise.  Do not smoke. Smoking lowers oxygen levels in the blood.  Only take over-the-counter or prescription medicines as told by your doctor.  Take antibiotic medicines as told by your doctor. Make sure you finish them even if you start to feel better.  Take supplements as told by your doctor.  Consider wearing a medical alert bracelet. This tells anyone caring for you in an emergency of your condition.  When traveling, keep your medical information, doctors' names, and the medicines you take with you at all times.  If you have a fever, do not take fever medicines right away. This could cover up a problem. Tell your doctor.  Keep all follow-up visits with your doctor. Sickle cell anemia requires regular medical care. Contact a doctor if: You have a fever. Get help right away if:  You feel dizzy or faint.  You have new belly (abdominal) pain, especially on the left side near the stomach area.  You have a lasting, often uncomfortable and painful erection of the penis (priapism). If it is not treated right away, you will become unable to have sex (impotence).  You have numbness in your arms or legs or you have a hard time moving them.  You have a hard time talking.  You have a fever or lasting symptoms for more than 2-3 days.  You have a fever and your symptoms suddenly get  worse.  You have signs or symptoms of infection. These include: ? Chills. ? Being more tired than normal (lethargy). ? Irritability. ? Poor eating. ? Throwing up (vomiting).  You have pain that is not helped with medicine.  You have shortness of breath.  You have pain in your chest.  You are coughing up pus-like or bloody mucus.  You have a stiff neck.  Your feet or hands swell or have pain.  Your belly looks bloated.  Your joints hurt. This information is not intended to replace advice given to you by your health care provider. Make sure you discuss any questions you have with your health care provider. Document Released: 02/13/2013 Document Revised: 10/01/2015 Document Reviewed: 12/05/2012 Elsevier Interactive Patient Education  2017 Elsevier Inc. Hydroxyurea capsules What is this medicine? HYDROXYUREA (hye drox ee yoor EE a) is a chemotherapy drug. This medicine is used to treat certain types of leukemias and head and neck cancer. It is also used to control the painful crises of sickle cell anemia. This medicine may be used for other purposes; ask your health care provider or pharmacist if you have questions. COMMON BRAND NAME(S): Droxia, Hydrea What should I tell my health care provider before I take this medicine? They need to know if you have any of these conditions: -gout or high levels of uric acid in the blood -HIV or AIDS -kidney disease or on hemodialysis -leg wounds or ulcers -low blood counts, like low white cell, platelet, or red cell  counts -prior or current interferon therapy -recent or ongoing radiation therapy -scheduled to receive a vaccine -an unusual or allergic reaction to hydroxyurea, other medicines, foods, dyes, or preservatives -pregnant or trying to get pregnant -breast-feeding How should I use this medicine? Take this medicine by mouth with a glass of water. Follow the directions on the prescription label. Take your medicine at regular  intervals. Do not take it more often than directed. Do not stop taking except on your doctor's advice. People who are not taking this medicine should not be exposed to it. Wash your hands before and after handling your bottle or medicine. Caregivers should wear disposable gloves if they must touch the bottle or medicine. Clean up any medicine powder that spills with a damp disposable towel and throw the towel away in a closed container, such as a plastic bag. A special MedGuide will be given to you by the pharmacist with each prescription and refill of Droxyia. Be sure to read this information carefully each time. Talk to your pediatrician regarding the use of this medicine in children. Special care may be needed. Overdosage: If you think you have taken too much of this medicine contact a poison control center or emergency room at once. NOTE: This medicine is only for you. Do not share this medicine with others. What if I miss a dose? If you miss a dose, take it as soon as you can. If it is almost time for your next dose, take only that dose. Do not take double or extra doses. What may interact with this medicine? This medicine may also interact with the following medications: -didanosine -stavudine -live virus vaccines This list may not describe all possible interactions. Give your health care provider a list of all the medicines, herbs, non-prescription drugs, or dietary supplements you use. Also tell them if you smoke, drink alcohol, or use illegal drugs. Some items may interact with your medicine. What should I watch for while using this medicine? This drug may make you feel generally unwell. This is not uncommon, as chemotherapy can affect healthy cells as well as cancer cells. Report any side effects. Continue your course of treatment even though you feel ill unless your doctor tells you to stop. You will receive regular blood tests during your treatment. Call your doctor or health care  professional for advice if you get a fever, chills or sore throat, or other symptoms of a cold or flu. Do not treat yourself. This drug decreases your body's ability to fight infections. Try to avoid being around people who are sick. This medicine may increase your risk to bruise or bleed. Call your doctor or health care professional if you notice any unusual bleeding. Talk to your doctor about your risk of cancer. You may be more at risk for certain types of cancers if you take this medicine. Keep out of the sun. If you cannot avoid being in the sun, wear protective clothing and use sunscreen. Do not use sun lamps or tanning beds/booths. Do not become pregnant while taking this medicine or for at least 6 months after stopping it. Women should inform their doctor if they wish to become pregnant or think they might be pregnant. Men should not father a child while taking this medicine and for at least a year after stopping it. There is a potential for serious side effects to an unborn child. Talk to your health care professional or pharmacist for more information. Do not breast-feed an infant while taking this  medicine. This may interfere with the ability to have or father a child. You should talk with your doctor or health care professional if you are concerned about your fertility. What side effects may I notice from receiving this medicine? Side effects that you should report to your doctor or health care professional as soon as possible: -allergic reactions like skin rash, itching or hives, swelling of the face, lips, or tongue -breathing problems -burning, redness or pain at the site of any radiation therapy -low blood counts - this medicine may decrease the number of white blood cells, red blood cells and platelets. You may be at increased risk for infections and bleeding. -signs of decreased platelets or bleeding - bruising, pinpoint red spots on the skin, black, tarry stools, blood in the  urine -signs of decreased red blood cells - unusually weak or tired, fainting spells, lightheadedness -signs of infection - fever or chills, cough, sore throat, pain or difficulty passing urine -signs and symptoms of bleeding such as bloody or black, tarry stools; red or dark-brown urine; spitting up blood or brown material that looks like coffee grounds; red spots on the skin; unusual bruising or bleeding from the eye, gums, or nose -skin ulcers Side effects that usually do not require medical attention (report to your doctor or health care professional if they continue or are bothersome): -constipation -diarrhea -loss of appetite -mouth sores -nausea This list may not describe all possible side effects. Call your doctor for medical advice about side effects. You may report side effects to FDA at 1-800-FDA-1088. Where should I keep my medicine? Keep out of the reach of children. See product for storage instructions. Each product may have different instructions. Keep tightly closed. Throw away any unused medicine after the expiration date. NOTE: This sheet is a summary. It may not cover all possible information. If you have questions about this medicine, talk to your doctor, pharmacist, or health care provider.  2018 Elsevier/Gold Standard (2016-04-28 11:43:13)

## 2017-12-11 NOTE — Progress Notes (Signed)
Subjective   Nicolas Rogers 22 y.o. male  [id number]  [id number]  August 10, 1995 22 y.o.     Chief Complaint  Patient presents with  . Sickle Cell Anemia  . Medication Refill    all meds    Patient presents for follow up on sickle cell anemia. Patient states that he needs refills on his medication. Patient states that he ran out of all his medications since January. Patient states that after being released from [facility], he was sent to live with his father in [place], [place] and was not able to follow up. Hx of high flow priapism. Patient states that he has an erection every day that lasts about 1 hour. Patient states that episodes are becoming painful.  Patient states that his pain is 0/10 today.    Past Medical History:  Diagnosis Date  . Anxiety   . Cholecystolithiasis   . Major depressive disorder   . Schizophrenia (HCC)   . Sickle cell anemia (HCC)     Social History   Socioeconomic History  . Marital status: Single    Spouse name: Not on file  . Number of children: Not on file  . Years of education: Not on file  . Highest education level: Not on file  Occupational History  . Not on file  Social Needs  . Financial resource strain: Not on file  . Food insecurity:    Worry: Not on file    Inability: Not on file  . Transportation needs:    Medical: Not on file    Non-medical: Not on file  Tobacco Use  . Smoking status: Never Smoker  . Smokeless tobacco: Never Used  Substance and Sexual Activity  . Alcohol use: No  . Drug use: No  . Sexual activity: Not on file  Lifestyle  . Physical activity:    Days per week: Not on file    Minutes per session: Not on file  . Stress: Not on file  Relationships  . Social connections:    Talks on phone: Not on file    Gets together: Not on file    Attends religious service: Not on file    Active member of club or organization: Not on file    Attends meetings of clubs or organizations: Not on file    Relationship status: Not  on file  . Intimate partner violence:    Fear of current or ex partner: Not on file    Emotionally abused: Not on file    Physically abused: Not on file    Forced sexual activity: Not on file  Other Topics Concern  . Not on file  Social History Narrative  . Not on file      Review of Systems  Constitutional: Negative.   HENT: Negative.   Eyes: Negative.   Respiratory: Negative.   Cardiovascular: Negative.   Gastrointestinal: Negative.   Genitourinary: Negative.        Priapism   Musculoskeletal: Negative.   Skin: Negative.   Neurological: Negative.   Psychiatric/Behavioral: Negative.     Objective   Physical Exam  Constitutional: He is oriented to person, place, and time. He appears well-developed and well-nourished.  HENT:  Head: Atraumatic.  Right Ear: External ear normal.  Eyes: Pupils are equal, round, and reactive to light. Conjunctivae and EOM are normal.  Neck: Normal range of motion. Neck supple. No tracheal deviation present. No thyromegaly present.  Cardiovascular: Normal rate, regular rhythm, normal heart sounds and intact distal  pulses. Exam reveals no friction rub.  No murmur heard. Pulmonary/Chest: Effort normal and breath sounds normal. No stridor. No respiratory distress. He has no wheezes.  Abdominal: Soft. Bowel sounds are normal. He exhibits no distension and no mass. There is no tenderness.  Musculoskeletal: Normal range of motion.  Lymphadenopathy:    He has no cervical adenopathy.  Neurological: He is alert and oriented to person, place, and time.  Skin: Skin is warm and dry.  Psychiatric: He has a normal mood and affect. His behavior is normal. Judgment and thought content normal.  Nursing note and vitals reviewed.   BP 110/63 (BP Location: Right Arm, Patient Position: Sitting, Cuff Size: Normal)   Pulse 83   Temp 98 F (36.7 C) (Oral)   Resp 14   SpO2 92%   Assessment   Encounter Diagnoses  Name Primary?  Marland Kitchen. Hb-SS disease without  crisis (HCC) Yes  . Vitamin D deficiency      Plan  1. Hb-SS disease without crisis (HCC)  - Ambulatory referral to Ophthalmology - CBC With Differential - Comprehensive metabolic panel - VITAMIN D 25 Hydroxy (Vit-D Deficiency, Fractures) - Ferritin - Oxycodone HCl 10 MG TABS; Take 1 tablet (10 mg total) by mouth every 8 (eight) hours as needed (for pain).  Dispense: 45 tablet; Refill: 0 - hydroxyurea (HYDREA) 500 MG capsule; Take 2 capsules (1,000 mg total) by mouth daily. May take with food to minimize GI side effects.  Dispense: 60 capsule; Refill: 2 - folic acid (FOLVITE) 1 MG tablet; Take 1 tablet (1 mg total) by mouth daily.  Dispense: 30 tablet; Refill: 2 - ibuprofen (ADVIL,MOTRIN) 800 MG tablet; Take 1 tablet (800 mg total) by mouth every 8 (eight) hours as needed for moderate pain.  Dispense: 90 tablet; Refill: 2  2. Vitamin D deficiency  - VITAMIN D 25 Hydroxy (Vit-D Deficiency, Fractures)   Return to care as scheduled and prn. Patient verbalized understanding and agreed with plan of care.   1. Sickle cell disease - Continue Hydrea 1000mg  daily. We discussed the need for good hydration, monitoring of hydration status, avoidance of heat, cold, stress, and infection triggers. We discussed the risks and benefits of Hydrea, including bone marrow suppression, the possibility of GI upset, skin ulcers, hair thinning, and teratogenicity. The patient was reminded of the need to seek medical attention of any symptoms of bleeding, anemia, or infection. Continue folic acid 1 mg daily to prevent aplastic bone marrow crises.   2. Pulmonary evaluation - Patient denies severe recurrent wheezes, shortness of breath with exercise, or persistent cough. If these symptoms develop, pulmonary function tests with spirometry will be ordered, and if abnormal, plan on referral to Pulmonology for further evaluation.  3. Cardiac - Routine screening for pulmonary hypertension is not recommended.  4. Eye -  High risk of proliferative retinopathy. Annual eye exam with retinal exam recommended to patient. Referred.   5. Immunization status - She declines vaccines today. Yearly influenza vaccination is recommended, as well as being up to date with Meningococcal and Pneumococcal vaccines.   6. Acute and chronic painful episodes - We agreed on (Oxycodone 10 mg Q8H PRN and ibuprofen 800mg  Q8H PRN plan) . We discussed that pt is to receive Schedule II prescriptions only from us. Pt is also aware that the prescription history is available to us online through the [facility]. Controlled substance agreement signed today. We reminded (Pt) that all patients receiving Schedule II narcotics must be seen for follow within one  month of prescription being requested. We reviewed the terms of our pain agreement, including the need to keep medicines in a safe locked location away from children or pets, and the need to report excess sedation or constipation, measures to avoid constipation, and policies related to early refills and stolen prescriptions. According to the [place] [facility] program, we have reviewed details related to analgesia, adverse effects, aberrant behaviors.  7. Iron overload from chronic transfusion.  None noted.   8. Vitamin D deficiency - Drisdol 50,000 units weekly. Patient encouraged to take as prescribed.   The above recommendations are taken from the NIH Evidence-Based Management of Sickle Cell Disease: Expert Panel Report, 16109.   Ms. Andr L. Riley Lam, FNP-BC [facility] [address] [place], [place] [address] [phone]  This note has been created with Dragon speech recognition software and smart phrase technology. Any transcriptional errors are unintentional.

## 2017-12-12 LAB — CBC WITH DIFFERENTIAL
Basophils Absolute: 0 10*3/uL (ref 0.0–0.2)
Basos: 0 %
EOS (ABSOLUTE): 0.8 10*3/uL — ABNORMAL HIGH (ref 0.0–0.4)
Eos: 9 %
Hematocrit: 25.3 % — ABNORMAL LOW (ref 37.5–51.0)
Hemoglobin: 8.1 g/dL — ABNORMAL LOW (ref 13.0–17.7)
Immature Grans (Abs): 0 10*3/uL (ref 0.0–0.1)
Immature Granulocytes: 0 %
Lymphocytes Absolute: 3.7 10*3/uL — ABNORMAL HIGH (ref 0.7–3.1)
Lymphs: 39 %
MCH: 23.3 pg — ABNORMAL LOW (ref 26.6–33.0)
MCHC: 32 g/dL (ref 31.5–35.7)
MCV: 73 fL — ABNORMAL LOW (ref 79–97)
Monocytes Absolute: 0.9 10*3/uL (ref 0.1–0.9)
Monocytes: 9 %
NRBC: 3 % — ABNORMAL HIGH (ref 0–0)
Neutrophils Absolute: 4 10*3/uL (ref 1.4–7.0)
Neutrophils: 43 %
RBC: 3.47 x10E6/uL — ABNORMAL LOW (ref 4.14–5.80)
RDW: 25.3 % — ABNORMAL HIGH (ref 12.3–15.4)
WBC: 9.5 10*3/uL (ref 3.4–10.8)

## 2017-12-12 LAB — COMPREHENSIVE METABOLIC PANEL
ALT: 18 IU/L (ref 0–44)
AST: 40 IU/L (ref 0–40)
Albumin/Globulin Ratio: 1.2 (ref 1.2–2.2)
Albumin: 4.2 g/dL (ref 3.5–5.5)
Alkaline Phosphatase: 55 IU/L (ref 39–117)
BUN/Creatinine Ratio: 6 — ABNORMAL LOW (ref 9–20)
BUN: 5 mg/dL — ABNORMAL LOW (ref 6–20)
Bilirubin Total: 5.7 mg/dL — ABNORMAL HIGH (ref 0.0–1.2)
CO2: 20 mmol/L (ref 20–29)
Calcium: 9.3 mg/dL (ref 8.7–10.2)
Chloride: 102 mmol/L (ref 96–106)
Creatinine, Ser: 0.83 mg/dL (ref 0.76–1.27)
GFR calc Af Amer: 142 mL/min/{1.73_m2} (ref >59)
GFR calc non Af Amer: 123 mL/min/{1.73_m2} (ref >59)
Globulin, Total: 3.4 g/dL (ref 1.5–4.5)
Glucose: 88 mg/dL (ref 65–99)
Potassium: 4.9 mmol/L (ref 3.5–5.2)
Sodium: 135 mmol/L (ref 134–144)
Total Protein: 7.6 g/dL (ref 6.0–8.5)

## 2017-12-12 LAB — VITAMIN D 25 HYDROXY (VIT D DEFICIENCY, FRACTURES): Vit D, 25-Hydroxy: 14.9 ng/mL — ABNORMAL LOW (ref 30.0–100.0)

## 2017-12-12 LAB — FERRITIN: Ferritin: 32 ng/mL (ref 30–400)

## 2017-12-13 ENCOUNTER — Other Ambulatory Visit: Payer: Self-pay | Admitting: Family Medicine

## 2017-12-13 MED ORDER — VITAMIN D (ERGOCALCIFEROL) 1.25 MG (50000 UNIT) PO CAPS
50000.0000 [IU] | ORAL_CAPSULE | ORAL | 0 refills | Status: AC
Start: 2017-12-13 — End: 2018-02-07

## 2017-12-13 NOTE — Progress Notes (Signed)
Vitamin D low. Will send supplement.

## 2017-12-13 NOTE — Progress Notes (Signed)
Vitamin D sent

## 2017-12-14 ENCOUNTER — Telehealth: Payer: Self-pay

## 2017-12-14 NOTE — Telephone Encounter (Signed)
-----   Message from Mike GipAndre Douglas, FNP sent at 12/13/2017  4:46 PM EDT ----- Vitamin D low. Will send supplement.

## 2017-12-14 NOTE — Telephone Encounter (Signed)
Called and left message that vitamin D level is low and we have sent in vitamin D rx for once weekly to take as directed. Asked if questions to call back. Thanks!

## 2018-01-15 ENCOUNTER — Ambulatory Visit: Payer: Self-pay | Admitting: Family Medicine

## 2018-02-01 ENCOUNTER — Ambulatory Visit: Payer: Self-pay | Admitting: Family Medicine

## 2018-02-13 ENCOUNTER — Telehealth: Payer: Self-pay

## 2018-02-13 NOTE — Telephone Encounter (Signed)
Called, no answer. Left a message to remind patient of appointment on 02/15/2018. Thanks!  

## 2018-02-15 ENCOUNTER — Ambulatory Visit (INDEPENDENT_AMBULATORY_CARE_PROVIDER_SITE_OTHER): Payer: Medicare Other | Admitting: Family Medicine

## 2018-02-15 ENCOUNTER — Encounter: Payer: Self-pay | Admitting: Family Medicine

## 2018-02-15 VITALS — BP 109/68 | HR 76 | Temp 97.9°F | Resp 14 | Ht 71.0 in | Wt 158.0 lb

## 2018-02-15 DIAGNOSIS — Z23 Encounter for immunization: Secondary | ICD-10-CM | POA: Diagnosis not present

## 2018-02-15 DIAGNOSIS — E559 Vitamin D deficiency, unspecified: Secondary | ICD-10-CM | POA: Diagnosis not present

## 2018-02-15 DIAGNOSIS — D571 Sickle-cell disease without crisis: Secondary | ICD-10-CM

## 2018-02-15 MED ORDER — FOLIC ACID 1 MG PO TABS: 1.0000 mg | ORAL_TABLET | Freq: Every day | ORAL | 2 refills | Status: DC

## 2018-02-15 MED ORDER — IBUPROFEN 800 MG PO TABS: 800.0000 mg | ORAL_TABLET | Freq: Three times a day (TID) | ORAL | 2 refills | Status: DC | PRN

## 2018-02-15 MED ORDER — HYDROXYUREA 500 MG PO CAPS: 1000.0000 mg | ORAL_CAPSULE | Freq: Every day | ORAL | 2 refills | Status: DC

## 2018-02-15 NOTE — Patient Instructions (Signed)
Sickle Cell Anemia, Adult °Sickle cell anemia is a condition where your red blood cells are shaped like sickles. Red blood cells carry oxygen through the body. Sickle-shaped red blood cells do not live as long as normal red blood cells. They also clump together and block blood from flowing through the blood vessels. These things prevent the body from getting enough oxygen. Sickle cell anemia causes organ damage and pain. It also increases the risk of infection. °Follow these instructions at home: °· Drink enough fluid to keep your pee (urine) clear or pale yellow. Drink more in hot weather and during exercise. °· Do not smoke. Smoking lowers oxygen levels in the blood. °· Only take over-the-counter or prescription medicines as told by your doctor. °· Take antibiotic medicines as told by your doctor. Make sure you finish them even if you start to feel better. °· Take supplements as told by your doctor. °· Consider wearing a medical alert bracelet. This tells anyone caring for you in an emergency of your condition. °· When traveling, keep your medical information, doctors' names, and the medicines you take with you at all times. °· If you have a fever, do not take fever medicines right away. This could cover up a problem. Tell your doctor. °· Keep all follow-up visits with your doctor. Sickle cell anemia requires regular medical care. °Contact a doctor if: °You have a fever. °Get help right away if: °· You feel dizzy or faint. °· You have new belly (abdominal) pain, especially on the left side near the stomach area. °· You have a lasting, often uncomfortable and painful erection of the penis (priapism). If it is not treated right away, you will become unable to have sex (impotence). °· You have numbness in your arms or legs or you have a hard time moving them. °· You have a hard time talking. °· You have a fever or lasting symptoms for more than 2-3 days. °· You have a fever and your symptoms suddenly get  worse. °· You have signs or symptoms of infection. These include: °? Chills. °? Being more tired than normal (lethargy). °? Irritability. °? Poor eating. °? Throwing up (vomiting). °· You have pain that is not helped with medicine. °· You have shortness of breath. °· You have pain in your chest. °· You are coughing up pus-like or bloody mucus. °· You have a stiff neck. °· Your feet or hands swell or have pain. °· Your belly looks bloated. °· Your joints hurt. °This information is not intended to replace advice given to you by your health care provider. Make sure you discuss any questions you have with your health care provider. °Document Released: 02/13/2013 Document Revised: 10/01/2015 Document Reviewed: 12/05/2012 °Elsevier Interactive Patient Education © 2017 Elsevier Inc. ° °

## 2018-02-15 NOTE — Progress Notes (Signed)
[facility] INTERNAL MEDICINE AND SICKLE CELL CARE  SICKLE CELL ANEMIA FOLLOW UP VISIT PROVIDER: Mike Gip, FNP    Subjective:   Nicolas Rogers  is a 22 y.o.  male who  has a past medical history of Anxiety, Cholecystolithiasis, Major depressive disorder, Schizophrenia (HCC), and Sickle cell anemia (HCC). presents for a follow up for Sickle Cell Anemia. his last hospitalization was 04/2017. he has had less than 6  hospitalizations in the past 12 months.  Pain regimen includes: Ibuprofen and oxycodone 10 mg prn Hydrea Therapy: Yes Medication compliance: Yes  Pain today is 0/10.  Patient reports adequate hydration   Review of Systems  Constitutional: Negative.   HENT: Negative.   Eyes: Negative.   Respiratory: Negative.   Cardiovascular: Negative.   Gastrointestinal: Negative.   Genitourinary: Negative.   Musculoskeletal: Negative.   Skin: Negative.   Neurological: Negative.   Psychiatric/Behavioral: Negative.     Objective:   Objective  BP 109/68 (BP Location: Right Arm, Patient Position: Sitting, Cuff Size: Normal)   Pulse 76   Temp 97.9 F (36.6 C) (Oral)   Resp 14   Ht 5\' 11"  (1.803 m)   Wt 158 lb (71.7 kg)   SpO2 91%   BMI 22.04 kg/m    Physical Exam  Constitutional: He is oriented to person, place, and time. He appears well-developed and well-nourished. No distress.  HENT:  Head: Normocephalic and atraumatic.  Eyes: Pupils are equal, round, and reactive to light. Conjunctivae and EOM are normal.  Neck: Normal range of motion.  Cardiovascular: Normal rate, regular rhythm, normal heart sounds and intact distal pulses.  Pulmonary/Chest: Effort normal and breath sounds normal. No respiratory distress.  Abdominal: Soft. Bowel sounds are normal. He exhibits no distension.  Musculoskeletal: Normal range of motion.  Neurological: He is alert and oriented to person, place, and time.  Skin: Skin is warm and dry.  Psychiatric: He has a normal mood  and affect. His behavior is normal. Thought content normal.  Nursing note and vitals reviewed.    Assessment/Plan:   Assessment   Encounter Diagnoses  Name Primary?  Marland Kitchen Hb-SS disease without crisis (HCC) Yes  . Vitamin D deficiency      Plan  1. Hb-SS disease without crisis [facility]) Pending labs. Will adjust medications accordingly.   - Urinalysis, Routine w reflex microscopic - CBC with Differential - Comprehensive metabolic panel - VITAMIN D 25 Hydroxy (Vit-D Deficiency, Fractures) - hydroxyurea (HYDREA) 500 MG capsule; Take 2 capsules (1,000 mg total) by mouth daily. May take with food to minimize GI side effects.  Dispense: 60 capsule; Refill: 2 - folic acid (FOLVITE) 1 MG tablet; Take 1 tablet (1 mg total) by mouth daily.  Dispense: 30 tablet; Refill: 2 - ibuprofen (ADVIL,MOTRIN) 800 MG tablet; Take 1 tablet (800 mg total) by mouth every 8 (eight) hours as needed for moderate pain.  Dispense: 90 tablet; Refill: 2 - [id number]+OXYCODONE+CRT-UNBUND  2. Vitamin D deficiency - VITAMIN D 25 Hydroxy (Vit-D Deficiency, Fractures)   Return to care as scheduled and prn. Patient verbalized understanding and agreed with plan of care.   1. Sickle cell disease - Continue Hydrea   We discussed the need for good hydration, monitoring of hydration status, avoidance of heat, cold, stress, and infection triggers. We discussed the risks and benefits of Hydrea, including bone marrow suppression, the possibility of GI upset, skin ulcers, hair thinning, and teratogenicity. The patient was reminded of the need to seek medical attention of any  symptoms of bleeding, anemia, or infection. Continue folic acid 1 mg daily to prevent aplastic bone marrow crises.   2. Pulmonary evaluation - Patient denies severe recurrent wheezes, shortness of breath with exercise, or persistent cough. If these symptoms develop, pulmonary function tests with spirometry will be ordered, and if abnormal, plan on referral to  Pulmonology for further evaluation.  3. Cardiac - Routine screening for pulmonary hypertension is not recommended.  4. Eye - High risk of proliferative retinopathy. Annual eye exam with retinal exam recommended to patient.  5. Immunization status -  Yearly influenza vaccination is recommended, as well as being up to date with Meningococcal and Pneumococcal vaccines.   6. Acute and chronic painful episodes - We discussed that pt is to receive Schedule II prescriptions only from [place]. Pt is also aware that the prescription history is available to [place] online through the [facility] CSRS. Controlled substance agreement signed. We reminded Oluwatimilehin Balfour that all patients receiving Schedule II narcotics must be seen for follow within one month of prescription being requested. We reviewed the terms of our pain agreement, including the need to keep medicines in a safe locked location away from children or pets, and the need to report excess sedation or constipation, measures to avoid constipation, and policies related to early refills and stolen prescriptions. According to the [place] [facility] program, we have reviewed details related to analgesia, adverse effects, aberrant behaviors.  7. Iron overload from chronic transfusion.  Not applicable at this time.  If this occurs will use Exjade for management.   8. Vitamin D deficiency - Drisdol 50,000 units weekly. Patient encouraged to take as prescribed.   The above recommendations are taken from the NIH Evidence-Based Management of Sickle Cell Disease: Expert Panel Report, 16109.   Ms. Andr L. Riley Lam, FNP-BC [facility] [address] [place], [place] [address] [phone]  This note has been created with Dragon speech recognition software and smart phrase technology. Any transcriptional errors are unintentional.

## 2018-02-16 LAB — CBC WITH DIFFERENTIAL/PLATELET
Basophils Absolute: 0.1 10*3/uL (ref 0.0–0.2)
Basos: 1 %
EOS (ABSOLUTE): 0.5 10*3/uL — ABNORMAL HIGH (ref 0.0–0.4)
Eos: 4 %
Hematocrit: 21 % — ABNORMAL LOW (ref 37.5–51.0)
Hemoglobin: 7.2 g/dL — ABNORMAL LOW (ref 13.0–17.7)
Immature Grans (Abs): 0 10*3/uL (ref 0.0–0.1)
Immature Granulocytes: 0 %
Lymphocytes Absolute: 6.1 10*3/uL — ABNORMAL HIGH (ref 0.7–3.1)
Lymphs: 50 %
MCH: 25.3 pg — ABNORMAL LOW (ref 26.6–33.0)
MCHC: 34.3 g/dL (ref 31.5–35.7)
MCV: 74 fL — ABNORMAL LOW (ref 79–97)
Monocytes Absolute: 1.2 10*3/uL — ABNORMAL HIGH (ref 0.1–0.9)
Monocytes: 9 %
NRBC: 2 % — ABNORMAL HIGH (ref 0–0)
Neutrophils Absolute: 4.5 10*3/uL (ref 1.4–7.0)
Neutrophils: 36 %
Platelets: 489 10*3/uL — ABNORMAL HIGH (ref 150–450)
RBC: 2.85 x10E6/uL — ABNORMAL LOW (ref 4.14–5.80)
RDW: 25.7 % — ABNORMAL HIGH (ref 12.3–15.4)
WBC: 12.5 10*3/uL — ABNORMAL HIGH (ref 3.4–10.8)

## 2018-02-16 LAB — COMPREHENSIVE METABOLIC PANEL
ALT: 15 IU/L (ref 0–44)
AST: 45 IU/L — ABNORMAL HIGH (ref 0–40)
Albumin/Globulin Ratio: 1.3 (ref 1.2–2.2)
Albumin: 4.3 g/dL (ref 3.5–5.5)
Alkaline Phosphatase: 69 IU/L (ref 39–117)
BUN/Creatinine Ratio: 6 — ABNORMAL LOW (ref 9–20)
BUN: 5 mg/dL — ABNORMAL LOW (ref 6–20)
Bilirubin Total: 6.2 mg/dL — ABNORMAL HIGH (ref 0.0–1.2)
CO2: 23 mmol/L (ref 20–29)
Calcium: 9.3 mg/dL (ref 8.7–10.2)
Chloride: 103 mmol/L (ref 96–106)
Creatinine, Ser: 0.84 mg/dL (ref 0.76–1.27)
GFR calc Af Amer: 142 mL/min/{1.73_m2} (ref >59)
GFR calc non Af Amer: 123 mL/min/{1.73_m2} (ref >59)
Globulin, Total: 3.2 g/dL (ref 1.5–4.5)
Glucose: 79 mg/dL (ref 65–99)
Potassium: 4.6 mmol/L (ref 3.5–5.2)
Sodium: 137 mmol/L (ref 134–144)
Total Protein: 7.5 g/dL (ref 6.0–8.5)

## 2018-02-16 LAB — URINALYSIS, ROUTINE W REFLEX MICROSCOPIC
Bilirubin, UA: NEGATIVE
Glucose, UA: NEGATIVE
Ketones, UA: NEGATIVE
Leukocytes, UA: NEGATIVE
Nitrite, UA: NEGATIVE
Protein, UA: NEGATIVE
RBC, UA: NEGATIVE
Specific Gravity, UA: 1.013 (ref 1.005–1.030)
Urobilinogen, Ur: 0.2 mg/dL (ref 0.2–1.0)
pH, UA: 6 (ref 5.0–7.5)

## 2018-02-16 LAB — 737588 9+OXYCODONE+CRT-UNBUND
Amphetamine Scrn, Ur: NEGATIVE ng/mL
BARBITURATE SCREEN URINE: NEGATIVE ng/mL
BENZODIAZEPINE SCREEN, URINE: NEGATIVE ng/mL
CANNABINOIDS UR QL SCN: NEGATIVE ng/mL
Cocaine (Metab) Scrn, Ur: NEGATIVE ng/mL
Creatinine(Crt), U: 154.4 mg/dL (ref 20.0–300.0)
Methadone Screen, Urine: NEGATIVE ng/mL
OXYCODONE+OXYMORPHONE UR QL SCN: NEGATIVE ng/mL
Opiate Scrn, Ur: NEGATIVE ng/mL
Ph of Urine: 5.9 (ref 4.5–8.9)
Phencyclidine Qn, Ur: NEGATIVE ng/mL
Propoxyphene Scrn, Ur: NEGATIVE ng/mL

## 2018-02-16 LAB — VITAMIN D 25 HYDROXY (VIT D DEFICIENCY, FRACTURES): Vit D, 25-Hydroxy: 11.3 ng/mL — ABNORMAL LOW (ref 30.0–100.0)

## 2018-03-04 MED ORDER — VITAMIN D (ERGOCALCIFEROL) 1.25 MG (50000 UNIT) PO CAPS: 50000.0000 [IU] | ORAL_CAPSULE | ORAL | 5 refills | Status: AC

## 2018-03-04 NOTE — Addendum Note (Signed)
Addended by: Reatha Harps on: 03/04/2018 06:53 AM   Modules accepted: Orders

## 2018-04-18 ENCOUNTER — Ambulatory Visit: Payer: Self-pay | Admitting: Family Medicine

## 2018-04-27 ENCOUNTER — Ambulatory Visit: Payer: Self-pay | Admitting: Family Medicine

## 2018-09-28 ENCOUNTER — Other Ambulatory Visit: Payer: Self-pay

## 2018-09-28 ENCOUNTER — Emergency Department (HOSPITAL_COMMUNITY)
Admission: EM | Admit: 2018-09-28 | Discharge: 2018-09-28 | Disposition: A | Discharge status: Home / Self Care | Payer: Medicare Other | Attending: Emergency Medicine | Admitting: Emergency Medicine

## 2018-09-28 ENCOUNTER — Encounter (HOSPITAL_COMMUNITY): Payer: Self-pay

## 2018-09-28 DIAGNOSIS — R1013 Epigastric pain: Secondary | ICD-10-CM

## 2018-09-28 DIAGNOSIS — Z79899 Other long term (current) drug therapy: Secondary | ICD-10-CM | POA: Insufficient documentation

## 2018-09-28 LAB — CBC
HCT: 21.9 % — ABNORMAL LOW (ref 39.0–52.0)
Hemoglobin: 7.4 g/dL — ABNORMAL LOW (ref 13.0–17.0)
MCH: 24.9 pg — ABNORMAL LOW (ref 26.0–34.0)
MCHC: 33.8 g/dL (ref 30.0–36.0)
MCV: 73.7 fL — ABNORMAL LOW (ref 80.0–100.0)
Platelets: 500 10*3/uL — ABNORMAL HIGH (ref 150–400)
RBC: 2.97 MIL/uL — ABNORMAL LOW (ref 4.22–5.81)
RDW: 29.4 % — ABNORMAL HIGH (ref 11.5–15.5)
WBC: 13.3 10*3/uL — ABNORMAL HIGH (ref 4.0–10.5)
nRBC: 1.1 % — ABNORMAL HIGH (ref 0.0–0.2)

## 2018-09-28 LAB — COMPREHENSIVE METABOLIC PANEL
ALT: 22 U/L (ref 0–44)
AST: 53 U/L — ABNORMAL HIGH (ref 15–41)
Albumin: 4.1 g/dL (ref 3.5–5.0)
Alkaline Phosphatase: 48 U/L (ref 38–126)
Anion gap: 5 (ref 5–15)
BUN: 6 mg/dL (ref 6–20)
CO2: 24 mmol/L (ref 22–32)
Calcium: 8.6 mg/dL — ABNORMAL LOW (ref 8.9–10.3)
Chloride: 109 mmol/L (ref 98–111)
Creatinine, Ser: 0.7 mg/dL (ref 0.61–1.24)
GFR calc Af Amer: >60 mL/min (ref >60)
GFR calc non Af Amer: >60 mL/min (ref >60)
Glucose, Bld: 98 mg/dL (ref 70–99)
Potassium: 4.6 mmol/L (ref 3.5–5.1)
Sodium: 138 mmol/L (ref 135–145)
Total Bilirubin: 3.5 mg/dL — ABNORMAL HIGH (ref 0.3–1.2)
Total Protein: 7.8 g/dL (ref 6.5–8.1)

## 2018-09-28 LAB — LIPASE, BLOOD: Lipase: 30 U/L (ref 11–51)

## 2018-09-28 MED ORDER — SODIUM CHLORIDE 0.9% FLUSH: 3.0000 mL | Freq: Once | INTRAVENOUS | Status: DC

## 2018-09-28 MED ORDER — OMEPRAZOLE 20 MG PO CPDR: 20.0000 mg | DELAYED_RELEASE_CAPSULE | Freq: Every day | ORAL | 0 refills | Status: DC

## 2018-09-28 MED ORDER — LIDOCAINE VISCOUS HCL 2 % MT SOLN
15.0000 mL | Freq: Once | OROMUCOSAL | Status: AC
  Administered 2018-09-28: 15 mL via ORAL
  Filled 2018-09-28: qty 15

## 2018-09-28 MED ORDER — FAMOTIDINE 20 MG PO TABS
20.0000 mg | ORAL_TABLET | Freq: Once | ORAL | Status: AC
  Administered 2018-09-28: 20 mg via ORAL
  Filled 2018-09-28: qty 1

## 2018-09-28 MED ORDER — ALUM & MAG HYDROXIDE-SIMETH 200-200-20 MG/5ML PO SUSP
30.0000 mL | Freq: Once | ORAL | Status: AC
  Administered 2018-09-28: 05:00:00 30 mL via ORAL
  Filled 2018-09-28: qty 30

## 2018-09-28 NOTE — ED Provider Notes (Signed)
[facility]-EMERGENCY DEPT Provider Note   CSN: [id number] Arrival date & time: 09/28/18  0201    History   Chief Complaint Chief Complaint  Patient presents with   Abdominal Pain    HPI Nicolas Rogers is a 23 y.o. male with a history of major depressive disorder, sickle cell anemia, schizophrenia, cholelithiasis s/p cholecystectomy, anxiety, intentional drug overdose who presents to the emergency department with a chief complaint of epigastric pain.  Patient endorses intermittent episodes of epigastric pain for the last week.  He characterizes the pain as sharp and nonradiating.  States that the episodes will last for approximately 5 minutes before resolving spontaneously.  He thinks the episodes may be worse after eating, but is not certain.  He denies increased burping or belching, nausea, diarrhea, constipation, dysuria, hematuria, chest pain, shortness of breath, cough, melena, or hematochezia.  Reports that he did have one episode of nonbloody, nonbilious vomiting several days ago.  He has been eating and drinking without difficulty.  Patient reports that he is concerned that he may have a tapeworm after researching his symptoms on the Internet.  He reports that he ate a cold cut sandwich with ham, bologna, and [place] from [employer] 1 week ago before his symptoms began.  He denies recent travel outside of the country or to third world countries.  No known history of undercooked meats.  States he is a never smoker.  Reports he drinks alcohol "socially", but does not elaborate.  He denies any other IV or recreational drug use.  He has not tried anything for his symptoms.  He denies recent NSAID use.  Surgical history includes cholecystectomy     The history is provided by the patient. No language interpreter was used.    Past Medical History:  Diagnosis Date   Anxiety    Cholecystolithiasis    Major depressive disorder    Schizophrenia (HCC)    Sickle cell  anemia (HCC)     Patient Active Problem List   Diagnosis Date Noted   MDD (major depressive disorder), recurrent severe, without psychosis (HCC) 02/15/2017   Major depressive disorder, single episode with psychotic features (HCC) 02/14/2017   MDD (major depressive episode), single episode, severe, no psychosis (HCC) 02/14/2017   Anxiety, generalized 09/01/2016   Visual hallucinations 09/01/2016   Auditory hallucinations 09/01/2016   Hb-SS disease with acute chest syndrome (HCC) 08/29/2016   CAP (community acquired pneumonia) 08/29/2016   Hb-SS disease with crisis (HCC) 08/23/2016   Vitamin D deficiency 03/12/2014   Need for immunization against influenza 03/12/2014   Hb-SS disease without crisis (HCC) 01/28/2013    Past Surgical History:  Procedure Laterality Date   ADENOIDECTOMY     CHOLECYSTECTOMY     TONSILLECTOMY          Home Medications    Prior to Admission medications   Medication Sig Start Date End Date Taking? Authorizing Provider  folic acid (FOLVITE) 1 MG tablet Take 1 tablet (1 mg total) by mouth daily. 02/15/18   Mike Gip, FNP  hydroxyurea (HYDREA) 500 MG capsule Take 2 capsules (1,000 mg total) by mouth daily. May take with food to minimize GI side effects. 02/15/18   Mike Gip, FNP  hydrOXYzine (ATARAX/VISTARIL) 25 MG tablet Take 1 tablet (25 mg total) by mouth 3 (three) times daily as needed for anxiety. Patient taking differently: Take 50 mg by mouth daily.  02/20/17   Armandina Stammer I, NP  ibuprofen (ADVIL,MOTRIN) 800 MG tablet Take 1 tablet (800  mg total) by mouth every 8 (eight) hours as needed for moderate pain. 02/15/18   Mike Gipouglas, Andre, FNP  mirtazapine (REMERON) 15 MG tablet Take 1 tablet (15 mg total) by mouth at bedtime. For depression/insomnia 02/20/17   Armandina StammerNwoko, Agnes I, NP  omeprazole (PRILOSEC) 20 MG capsule Take 1 capsule (20 mg total) by mouth daily for 14 days. 09/28/18 10/12/18  Gustava Berland A, PA-C  Oxycodone HCl 10 MG  TABS Take 1 tablet (10 mg total) by mouth every 8 (eight) hours as needed (for pain). 12/11/17   Mike Gipouglas, Andre, FNP  sertraline (ZOLOFT) 25 MG tablet Take 1 tablet (25 mg total) by mouth daily. For depression Patient not taking: Reported on 02/15/2018 02/21/17   Armandina StammerNwoko, Agnes I, NP  sertraline (ZOLOFT) 50 MG tablet Take 50 mg by mouth daily.    [provider]  traZODone (DESYREL) 50 MG tablet Take 1 tablet (50 mg total) by mouth at bedtime as needed for sleep (depression). 02/20/17   Sanjuana KavaNwoko, Agnes I, NP    Family History History reviewed. No pertinent family history.  Social History Social History   Tobacco Use   Smoking status: Never Smoker   Smokeless tobacco: Never Used  Substance Use Topics   Alcohol use: No   Drug use: No     Allergies   Patient has no known allergies.   Review of Systems Review of Systems  Constitutional: Negative for appetite change, chills and fever.  HENT: Negative for congestion, sinus pressure, sinus pain and sore throat.   Respiratory: Negative for cough, shortness of breath and wheezing.   Cardiovascular: Negative for chest pain, palpitations and leg swelling.  Gastrointestinal: Positive for abdominal pain and vomiting (resolved). Negative for diarrhea and nausea.  Genitourinary: Negative for decreased urine volume, dysuria, flank pain, hematuria, penile pain, scrotal swelling, testicular pain and urgency.  Musculoskeletal: Negative for back pain.  Skin: Negative for rash.  Allergic/Immunologic: Negative for immunocompromised state.  Neurological: Negative for dizziness, weakness, numbness and headaches.  Psychiatric/Behavioral: Negative for confusion.   Physical Exam Updated Vital Signs BP 118/83 (BP Location: Left Arm)    Pulse 68    Temp 98.3 F (36.8 C) (Oral)    Resp 16    SpO2 100%   Physical Exam Vitals signs and nursing note reviewed.  Constitutional:      General: He is not in acute distress.    Appearance: He is  well-developed. He is not ill-appearing, toxic-appearing or diaphoretic.  HENT:     Head: Normocephalic.  Eyes:     Conjunctiva/sclera: Conjunctivae normal.  Neck:     Musculoskeletal: Neck supple.  Cardiovascular:     Rate and Rhythm: Normal rate and regular rhythm.     Pulses: Normal pulses.     Heart sounds: Normal heart sounds. No murmur. No friction rub. No gallop.   Pulmonary:     Effort: Pulmonary effort is normal. No respiratory distress.     Breath sounds: No stridor. No wheezing, rhonchi or rales.  Chest:     Chest wall: No tenderness.  Abdominal:     General: Bowel sounds are normal. There is no distension.     Palpations: Abdomen is soft. There is no mass.     Tenderness: There is abdominal tenderness. There is no right CVA tenderness, left CVA tenderness, guarding or rebound.     Hernia: No hernia is present.     Comments: Tender to palpation in the epigastric region without rebound or guarding.  Right upper  quadrant and left upper quadrant are unremarkable.  Negative Murphy sign.  Abdomen is soft, nondistended.  No CVA tenderness bilaterally.  Normoactive bowel sounds.  Lower abdomen is unremarkable.  Skin:    General: Skin is warm and dry.  Neurological:     Mental Status: He is alert.  Psychiatric:        Behavior: Behavior normal.      ED Treatments / Results  Labs (all labs ordered are listed, but only abnormal results are displayed) Labs Reviewed  COMPREHENSIVE METABOLIC PANEL - Abnormal; Notable for the following components:      Result Value   Calcium 8.6 (*)    AST 53 (*)    Total Bilirubin 3.5 (*)    All other components within normal limits  CBC - Abnormal; Notable for the following components:   WBC 13.3 (*)    RBC 2.97 (*)    Hemoglobin 7.4 (*)    HCT 21.9 (*)    MCV 73.7 (*)    MCH 24.9 (*)    RDW 29.4 (*)    Platelets 500 (*)    nRBC 1.1 (*)    All other components within normal limits  LIPASE, BLOOD    EKG None  Radiology No  results found.  Procedures Procedures (including critical care time)  Medications Ordered in ED Medications  sodium chloride flush (NS) 0.9 % injection 3 mL (3 mLs Intravenous Not Given 09/28/18 0408)  famotidine (PEPCID) tablet 20 mg (20 mg Oral Given 09/28/18 0437)  alum & mag hydroxide-simeth (MAALOX/MYLANTA) 200-200-20 MG/5ML suspension 30 mL (30 mLs Oral Given 09/28/18 0436)    And  lidocaine (XYLOCAINE) 2 % viscous mouth solution 15 mL (15 mLs Oral Given 09/28/18 0436)     Initial Impression / Assessment and Plan / ED Course  I have reviewed the triage vital signs and the nursing notes.  Pertinent labs & imaging results that were available during my care of the patient were reviewed by me and considered in my medical decision making (see chart for details).        23 year old male with a history of major depressive disorder, sickle cell anemia, schizophrenia, cholelithiasis s/p cholecystectomy, anxiety, intentional drug overdose resenting with intermittent episodes of epigastric pain for the last week that will last for approximately 5 minutes before resolving.  He did have one episode of vomiting several days ago, which has since resolved.  No constitutional symptoms.  He presents to the ER today because he is concerned he has a tapeworm and after researching his symptoms online.  He is concerned that the tapeworm came from a [employer] sandwich that he ate with cold cuts, ham, bologna, and [place] 1 week ago.  Labs are notable for AST of 53 and ALT of 22, concerning for chronic alcohol use.  Total bilirubin is elevated, but expected and sickle cell anemia.  He has a leukocytosis of 13.3, minimally changed from his baseline.  Hemoglobin appears at his baseline at 7.4 and improved from previous.  Differential diagnosis includes peptic ulcer disease, pancreatitis, postoperative complication of cholecystitis, gastritis, GERD, and splenic infarct given his history of sickle cell anemia.  Given  reassuring labs and episodic nature of pain, I suspect the patient may be having gastritis, possibly due to alcohol use.  He was given Pepcid and a GI cocktail in the ER and reports significant improvement in his symptoms.  We will discharge him with the same.  Advised him to follow-up with his primary care provider  if his symptoms persist.  He was also given strict return precautions to the ER.  At this time, he is hemodynamically stable and in no acute distress.  He is pain-free.  Safe for discharge home with outpatient follow-up.  Final Clinical Impressions(s) / ED Diagnoses   Final diagnoses:  Epigastric pain    ED Discharge Orders         Ordered    omeprazole (PRILOSEC) 20 MG capsule  Daily     09/28/18 0508           Barkley Boards, PA-C 09/28/18 0747    Palumbo, April, MD 09/29/18 0349

## 2018-09-28 NOTE — Discharge Instructions (Signed)
Thank you for allowing me to care for you today in the Emergency Department.   Take 1 tablet of omeprazole daily for the next 14 days.  You can also try taking Maalox, which is available over-the-counter, if you continue to have episodes of pain.  There are some foods and substances that can cause worsening irritation to the lining of the stomach.  These include taking NSAIDs, medications like ibuprofen or Aleve, without food, alcohol, and other food choices that are also attached along with your discharge instructions.  If your symptoms do not improve, please follow-up with your sickle cell team for reevaluation.  Return to the emergency department if you develop black or bloody stools, persistent vomiting, high fever with abdominal pain, or other new, concerning symptoms.

## 2018-09-28 NOTE — ED Triage Notes (Signed)
Pt reports LUQ abdominal pain x1 week. He states that it started after eating a [employer] sandwich. He is concerned about a tapeworm. He states that he has vomited one time. Denies diarrhea. A&Ox4.

## 2018-12-04 ENCOUNTER — Ambulatory Visit: Payer: Self-pay | Admitting: Family Medicine

## 2018-12-04 ENCOUNTER — Ambulatory Visit (INDEPENDENT_AMBULATORY_CARE_PROVIDER_SITE_OTHER): Payer: Worker's Compensation | Admitting: Family Medicine

## 2018-12-04 ENCOUNTER — Other Ambulatory Visit: Payer: Self-pay

## 2018-12-04 ENCOUNTER — Encounter: Payer: Self-pay | Admitting: Family Medicine

## 2018-12-04 VITALS — BP 106/69 | HR 83 | Temp 98.5°F | Resp 14 | Ht 71.0 in | Wt 156.0 lb

## 2018-12-04 DIAGNOSIS — G894 Chronic pain syndrome: Secondary | ICD-10-CM

## 2018-12-04 DIAGNOSIS — M87051 Idiopathic aseptic necrosis of right femur: Secondary | ICD-10-CM | POA: Diagnosis not present

## 2018-12-04 DIAGNOSIS — E559 Vitamin D deficiency, unspecified: Secondary | ICD-10-CM

## 2018-12-04 DIAGNOSIS — D5701 Hb-SS disease with acute chest syndrome: Secondary | ICD-10-CM | POA: Diagnosis not present

## 2018-12-04 DIAGNOSIS — D571 Sickle-cell disease without crisis: Secondary | ICD-10-CM

## 2018-12-04 LAB — POCT URINALYSIS DIPSTICK
Bilirubin, UA: NEGATIVE
Glucose, UA: NEGATIVE
Ketones, UA: NEGATIVE
Leukocytes, UA: NEGATIVE
Nitrite, UA: NEGATIVE
Protein, UA: NEGATIVE
Spec Grav, UA: 1.02 (ref 1.010–1.025)
Urobilinogen, UA: 2 E.U./dL — AB
pH, UA: 6 (ref 5.0–8.0)

## 2018-12-04 MED ORDER — OXYCODONE HCL 10 MG PO TABS: 10.0000 mg | ORAL_TABLET | Freq: Three times a day (TID) | ORAL | 0 refills | Status: DC | PRN

## 2018-12-04 MED ORDER — FOLIC ACID 1 MG PO TABS: 1.0000 mg | ORAL_TABLET | Freq: Every day | ORAL | 2 refills | Status: DC

## 2018-12-04 MED ORDER — IBUPROFEN 800 MG PO TABS: 800.0000 mg | ORAL_TABLET | Freq: Three times a day (TID) | ORAL | 2 refills | Status: DC | PRN

## 2018-12-04 MED ORDER — HYDROXYUREA 500 MG PO CAPS: 1000.0000 mg | ORAL_CAPSULE | Freq: Every day | ORAL | 2 refills | Status: DC

## 2018-12-04 NOTE — Patient Instructions (Signed)
Discussed the importance of drinking 64 ounces of water daily. The Importance of Water. To help prevent pain crises, it is important to drink plenty of water throughout the day. This is because dehydration of red blood cells may lead to the sickling process.    Sickle Cell Anemia, Adult  Sickle cell anemia is a condition where your red blood cells are shaped like sickles. Red blood cells carry oxygen through the body. Sickle-shaped cells do not live as long as normal red blood cells. They also clump together and block blood from flowing through the blood vessels. This prevents the body from getting enough oxygen. Sickle cell anemia causes organ damage and pain. It also increases the risk of infection. Follow these instructions at home: Medicines  Take over-the-counter and prescription medicines only as told by your doctor.  If you were prescribed an antibiotic medicine, take it as told by your doctor. Do not stop taking the antibiotic even if you start to feel better.  If you develop a fever, do not take medicines to lower the fever right away. Tell your doctor about the fever. Managing pain, stiffness, and swelling  Try these methods to help with pain: ? Use a heating pad. ? Take a warm bath. ? Distract yourself, such as by watching TV. Eating and drinking  Drink enough fluid to keep your pee (urine) clear or pale yellow. Drink more in hot weather and during exercise.  Limit or avoid alcohol.  Eat a healthy diet. Eat plenty of fruits, vegetables, whole grains, and lean protein.  Take vitamins and supplements as told by your doctor. Traveling  When traveling, keep these with you: ? Your medical information. ? The names of your doctors. ? Your medicines.  If you need to take an airplane, talk to your doctor first. Activity  Rest often.  Avoid exercises that make your heart beat much faster, such as jogging. General instructions  Do not use products that have nicotine or  tobacco, such as cigarettes and e-cigarettes. If you need help quitting, ask your doctor.  Consider wearing a medical alert bracelet.  Avoid being in high places (high altitudes), such as mountains.  Avoid very hot or cold temperatures.  Avoid places where the temperature changes a lot.  Keep all follow-up visits as told by your doctor. This is important. Contact a doctor if:  A joint hurts.  Your feet or hands hurt or swell.  You feel tired (fatigued). Get help right away if:  You have symptoms of infection. These include: ? Fever. ? Chills. ? Being very tired. ? Irritability. ? Poor eating. ? Throwing up (vomiting).  You feel dizzy or faint.  You have new stomach pain, especially on the left side.  You have a an erection (priapism) that lasts more than 4 hours.  You have numbness in your arms or legs.  You have a hard time moving your arms or legs.  You have trouble talking.  You have pain that does not go away when you take medicine.  You are short of breath.  You are breathing fast.  You have a long-term cough.  You have pain in your chest.  You have a bad headache.  You have a stiff neck.  Your stomach looks bloated even though you did not eat much.  Your skin is pale.  You suddenly cannot see well. Summary  Sickle cell anemia is a condition where your red blood cells are shaped like sickles.  Follow your doctor's advice   on ways to manage pain, food to eat, activities to do, and steps to take for safe travel.  Get medical help right away if you have any signs of infection, such as a fever. This information is not intended to replace advice given to you by your health care provider. Make sure you discuss any questions you have with your health care provider. Document Released: 02/13/2013 Document Revised: 08/17/2018 Document Reviewed: 05/31/2016 Elsevier Patient Education  2020 [employer]  

## 2018-12-04 NOTE — Progress Notes (Signed)
Established Patient Office Visit  Subjective:  Patient ID: Nicolas Rogers, male    DOB: Dec 25, 1995  Age: 23 y.o. MRN: [id number]  CC:  Chief Complaint  Patient presents with  . Sickle Cell Anemia    HPI Nicolas Rogers, a 23 year old male with a medical history significant for sickle cell disease, anxiety, and schizoaffective disorder presents for a 4457-month follow-up of sickle cell disease.  Patient states that he has been doing well over the past several months.  He is not had pain in 4 months.  He typically takes ibuprofen 800 mg for pain, if it is ineffective he takes oxycodone 10 mg.  Patient states that his previous prescription for oxycodone has expired.  He only uses pain medication to prevent sickle cell pain crisis. He consistently takes hydroxyurea and folic acid.  Patient does not have a hematologist at present.  He states that it is been greater than 2 years since previous eye exam.  Patient says that he is up-to-date on vaccinations.  He is currently without pain.  Patient is afebrile.  Patient denies headache, shortness of breath, chest pain, dysuria, nausea, vomiting, or diarrhea.  Patient also denies recent travel, sick contacts, or exposure to COVID-19.  Patient has a history of anxiety and schizoaffective disorder.  Patient states that he has been off of medications for greater than 1 year.  He does not have follow-up scheduled with psychiatry.  He states that anxiety was episodic.  Past Medical History:  Diagnosis Date  . Anxiety   . Cholecystolithiasis   . Major depressive disorder   . Schizophrenia (HCC)   . Sickle cell anemia (HCC)     Past Surgical History:  Procedure Laterality Date  . ADENOIDECTOMY    . CHOLECYSTECTOMY    . TONSILLECTOMY      History reviewed. No pertinent family history.  Social History   Socioeconomic History  . Marital status: Single    Spouse name: Not on file  . Number of children: Not on file  . Years of education: Not on file   . Highest education level: Not on file  Occupational History  . Not on file  Social Needs  . Financial resource strain: Not on file  . Food insecurity    Worry: Not on file    Inability: Not on file  . Transportation needs    Medical: Not on file    Non-medical: Not on file  Tobacco Use  . Smoking status: Never Smoker  . Smokeless tobacco: Never Used  Substance and Sexual Activity  . Alcohol use: No  . Drug use: No  . Sexual activity: Not on file  Lifestyle  . Physical activity    Days per week: Not on file    Minutes per session: Not on file  . Stress: Not on file  Relationships  . Social Musicianconnections    Talks on phone: Not on file    Gets together: Not on file    Attends religious service: Not on file    Active member of club or organization: Not on file    Attends meetings of clubs or organizations: Not on file    Relationship status: Not on file  . Intimate partner violence    Fear of current or ex partner: Not on file    Emotionally abused: Not on file    Physically abused: Not on file    Forced sexual activity: Not on file  Other Topics Concern  . Not on file  Social History Narrative  . Not on file    Outpatient Medications Prior to Visit  Medication Sig Dispense Refill  . folic acid (FOLVITE) 1 MG tablet Take 1 tablet (1 mg total) by mouth daily. 30 tablet 2  . hydroxyurea (HYDREA) 500 MG capsule Take 2 capsules (1,000 mg total) by mouth daily. May take with food to minimize GI side effects. 60 capsule 2  . ibuprofen (ADVIL,MOTRIN) 800 MG tablet Take 1 tablet (800 mg total) by mouth every 8 (eight) hours as needed for moderate pain. 90 tablet 2  . Oxycodone HCl 10 MG TABS Take 1 tablet (10 mg total) by mouth every 8 (eight) hours as needed (for pain). 45 tablet 0  . hydrOXYzine (ATARAX/VISTARIL) 25 MG tablet Take 1 tablet (25 mg total) by mouth 3 (three) times daily as needed for anxiety. (Patient not taking: Reported on 12/04/2018) 60 tablet 0  . mirtazapine  (REMERON) 15 MG tablet Take 1 tablet (15 mg total) by mouth at bedtime. For depression/insomnia (Patient not taking: Reported on 12/04/2018) 30 tablet 0  . omeprazole (PRILOSEC) 20 MG capsule Take 1 capsule (20 mg total) by mouth daily for 14 days. 14 capsule 0  . sertraline (ZOLOFT) 25 MG tablet Take 1 tablet (25 mg total) by mouth daily. For depression (Patient not taking: Reported on 02/15/2018) 30 tablet 0  . sertraline (ZOLOFT) 50 MG tablet Take 50 mg by mouth daily.    . traZODone (DESYREL) 50 MG tablet Take 1 tablet (50 mg total) by mouth at bedtime as needed for sleep (depression). (Patient not taking: Reported on 12/04/2018) 30 tablet 0   No facility-administered medications prior to visit.     No Known Allergies  ROS Review of Systems    Objective:    Physical Exam  BP 106/69 (BP Location: Left Arm, Patient Position: Sitting, Cuff Size: Normal)   Pulse 83   Temp 98.5 F (36.9 C) (Oral)   Resp 14   Ht 5\' 11"  (1.803 m)   Wt 156 lb (70.8 kg)   SpO2 91%   BMI 21.76 kg/m  Wt Readings from Last 3 Encounters:  12/04/18 156 lb (70.8 kg)  02/15/18 158 lb (71.7 kg)  04/14/17 157 lb (71.2 kg)     There are no preventive care reminders to display for this patient.  There are no preventive care reminders to display for this patient.  No results found for: TSH Lab Results  Component Value Date   WBC 13.3 (H) 09/28/2018   HGB 7.4 (L) 09/28/2018   HCT 21.9 (L) 09/28/2018   MCV 73.7 (L) 09/28/2018   PLT 500 (H) 09/28/2018   Lab Results  Component Value Date   NA 138 09/28/2018   K 4.6 09/28/2018   CO2 24 09/28/2018   GLUCOSE 98 09/28/2018   BUN 6 09/28/2018   CREATININE 0.70 09/28/2018   BILITOT 3.5 (H) 09/28/2018   ALKPHOS 48 09/28/2018   AST 53 (H) 09/28/2018   ALT 22 09/28/2018   PROT 7.8 09/28/2018   ALBUMIN 4.1 09/28/2018   CALCIUM 8.6 (L) 09/28/2018   ANIONGAP 5 09/28/2018   No results found for: CHOL No results found for: HDL No results found for:  LDLCALC No results found for: TRIG No results found for: CHOLHDL No results found for: [id number]    Assessment & Plan:   Problem List Items Addressed This Visit      Respiratory   Hb-SS disease with acute chest syndrome (HCC) - Primary  Relevant Medications   folic acid (FOLVITE) 1 MG tablet   Other Relevant Orders   Urinalysis Dipstick (Completed)   CBC with Differential   Comprehensive metabolic panel     Musculoskeletal and Integument   Avascular necrosis of right femoral head (HCC)     Other   Vitamin D deficiency   Relevant Orders   Vitamin D, 25-hydroxy   Hb-SS disease without crisis (HCC)   Relevant Medications   folic acid (FOLVITE) 1 MG tablet   hydroxyurea (HYDREA) 500 MG capsule   ibuprofen (ADVIL) 800 MG tablet   Oxycodone HCl 10 MG TABS    Other Visit Diagnoses    Chronic pain syndrome       Relevant Medications   ibuprofen (ADVIL) 800 MG tablet   Oxycodone HCl 10 MG TABS   Other Relevant Orders   [id number] 11+Oxyco+Alc+Crt-Bund      Meds ordered this encounter  Medications  . folic acid (FOLVITE) 1 MG tablet    Sig: Take 1 tablet (1 mg total) by mouth daily.    Dispense:  30 tablet    Refill:  2  . hydroxyurea (HYDREA) 500 MG capsule    Sig: Take 2 capsules (1,000 mg total) by mouth daily. May take with food to minimize GI side effects.    Dispense:  60 capsule    Refill:  2  . ibuprofen (ADVIL) 800 MG tablet    Sig: Take 1 tablet (800 mg total) by mouth every 8 (eight) hours as needed for moderate pain.    Dispense:  90 tablet    Refill:  2    Order Specific Question:   Supervising Provider    Answer:   Quentin AngstJEGEDE, OLUGBEMIGA E [id number]]  . Oxycodone HCl 10 MG TABS    Sig: Take 1 tablet (10 mg total) by mouth every 8 (eight) hours as needed (for pain).    Dispense:  30 tablet    Refill:  0    Order Specific Question:   Supervising Provider    Answer:   Jeanann LewandowskyJEGEDE, OLUGBEMIGA E [id number]]   1. Hb-SS disease with acute chest syndrome (HCC) Sickle cell  disease -continue hydroxyurea daily Will check CBC for absolute neutrophil count and platelets. Will also check reticulocyte count. Will consider increasing dosage. We discussed the need for good hydration, monitoring of hydration status, avoidance of heat, cold, stress, and infection triggers. We discussed the risks and benefits of Hydrea, including bone marrow suppression, the possibility of GI upset, skin ulcers, hair thinning, and teratogenicity. Reminded Ms. Margo AyeHall of the needed to use contraception while on hydroxyurea due to teratogenicity. The patient was reminded of the need to seek medical attention of any symptoms of bleeding, anemia, or infection.  Milligrams daily for bone marrow support  Pulmonary evaluation - Patient denies severe recurrent wheezes, shortness of breath with exercise, or persistent cough. If these symptoms develop, pulmonary function tests with spirometry will be ordered, and if abnormal, plan on referral to Pulmonology for further evaluation.  Cardiac - Routine screening for pulmonary hypertension is not recommended.  Eye - High risk of proliferative retinopathy. Annual eye exam with retinal exam recommended to patient. Last eye examination 6 months ago  Immunization status -up-to-date  Acute and chronic painful episodes -oxycodone 10 mg every 8 hours as needed for moderate to severe chronic pain.  For mild to moderate pain, patient will use ibuprofen 800 mg every 8 hours with food.  Pain agreement previously signed in clinic.  Does not warrant  repeating.  PDMP reviewed prior to prescribing opiate medication.  No inconsistencies noted.   - Urinalysis Dipstick - CBC with Differential - Comprehensive metabolic panel  2. Hb-SS disease without crisis ([place]) - folic acid (FOLVITE) 1 MG tablet; Take 1 tablet (1 mg total) by mouth daily.  Dispense: 30 tablet; Refill: 2 - hydroxyurea (HYDREA) 500 MG capsule; Take 2 capsules (1,000 mg total) by mouth daily. May take with food to  minimize GI side effects.  Dispense: 60 capsule; Refill: 2 - ibuprofen (ADVIL) 800 MG tablet; Take 1 tablet (800 mg total) by mouth every 8 (eight) hours as needed for moderate pain.  Dispense: 90 tablet; Refill: 2 - Oxycodone HCl 10 MG TABS; Take 1 tablet (10 mg total) by mouth every 8 (eight) hours as needed (for pain).  Dispense: 30 tablet; Refill: 0  3. Avascular necrosis of right femoral head ([place]) Patient has a remote history of avascular necrosis.  Patient is without pain today.  4. Vitamin D deficiency - Vitamin D, 25-hydroxy  5. Chronic pain syndrome Continue oxycodone 10 mg every 8 hours as needed for moderate to severe pain. Ibuprofen 800 mg every 8 hours with food for mild to moderate pain.  - [id number] 11+Oxyco+Alc+Crt-Bund Follow.-up:  Follow-up for labs in 3 months Referral sent to ophthalmology Follow-up in clinic in 6 months  [place], MSN, FNP-C Patient [facility] [address] [place], [place] [address] [phone]

## 2018-12-28 ENCOUNTER — Encounter (HOSPITAL_COMMUNITY): Payer: Self-pay

## 2019-02-12 ENCOUNTER — Other Ambulatory Visit: Payer: Self-pay

## 2019-03-12 ENCOUNTER — Other Ambulatory Visit: Payer: Self-pay

## 2019-03-12 ENCOUNTER — Ambulatory Visit (INDEPENDENT_AMBULATORY_CARE_PROVIDER_SITE_OTHER): Payer: Worker's Compensation | Admitting: Family Medicine

## 2019-03-12 ENCOUNTER — Encounter: Payer: Self-pay | Admitting: Family Medicine

## 2019-03-12 VITALS — BP 103/62 | HR 69 | Temp 98.3°F | Resp 14 | Ht 71.0 in | Wt 158.0 lb

## 2019-03-12 DIAGNOSIS — D571 Sickle-cell disease without crisis: Secondary | ICD-10-CM | POA: Diagnosis not present

## 2019-03-12 DIAGNOSIS — D5701 Hb-SS disease with acute chest syndrome: Secondary | ICD-10-CM | POA: Diagnosis not present

## 2019-03-12 DIAGNOSIS — Z23 Encounter for immunization: Secondary | ICD-10-CM

## 2019-03-12 LAB — POCT URINALYSIS DIPSTICK
Bilirubin, UA: NEGATIVE
Blood, UA: NEGATIVE
Glucose, UA: NEGATIVE
Ketones, UA: NEGATIVE
Leukocytes, UA: NEGATIVE
Nitrite, UA: NEGATIVE
Protein, UA: NEGATIVE
Spec Grav, UA: 1.02 (ref 1.010–1.025)
Urobilinogen, UA: 4 E.U./dL — AB
pH, UA: 7 (ref 5.0–8.0)

## 2019-03-12 NOTE — Progress Notes (Signed)
Established Patient Office Visit  Subjective:  Patient ID: Nicolas Rogers, male    DOB: Mar 19, 1996  Age: 23 y.o. MRN: [id number]  CC:  Chief Complaint  Patient presents with  . Sickle Cell Anemia    HPI Nicolas Rogers, a 23 year old male with a medical history significant for sickle cell disease, generalized anxiety, and schizoaffective disorder presents for 18-month follow-up of sickle cell disease.  Patient does not have any new complaints.  Patient states that he continues to do well.  He has not had any visual or auditory hallucinations and has not followed by psychiatry for schizoaffective disorder.  Also, all antipsychotic medications have been discontinued over the past several years.  Patient states that it has been greater than 1 year since he had a pain crisis.  He typically controls pain with over-the-counter ibuprofen and Tylenol.  Patient says that he has not had to use oxycodone in quite some time.  Patient was previously referred to hematology, but did not establish care.  Patient is requesting influenza vaccination today.  Otherwise, he is up-to-date with immunizations.  He is currently afebrile.  Patient denies headache, shortness of breath, dizziness, paresthesias, dysuria, nausea, vomiting, or diarrhea.  Patient also denies recent travel, sick contacts, or exposure to COVID-19.  Past Medical History:  Diagnosis Date  . Anxiety   . Cholecystolithiasis   . Major depressive disorder   . Schizophrenia ([place])   . Sickle cell anemia (HCC)     Past Surgical History:  Procedure Laterality Date  . ADENOIDECTOMY    . CHOLECYSTECTOMY    . TONSILLECTOMY      History reviewed. No pertinent family history.  Social History   Socioeconomic History  . Marital status: Single    Spouse name: Not on file  . Number of children: Not on file  . Years of education: Not on file  . Highest education level: Not on file  Occupational History  . Not on file  Social Needs  . Financial  resource strain: Not on file  . Food insecurity    Worry: Not on file    Inability: Not on file  . Transportation needs    Medical: Not on file    Non-medical: Not on file  Tobacco Use  . Smoking status: Never Smoker  . Smokeless tobacco: Never Used  Substance and Sexual Activity  . Alcohol use: No  . Drug use: No  . Sexual activity: Not on file  Lifestyle  . Physical activity    Days per week: Not on file    Minutes per session: Not on file  . Stress: Not on file  Relationships  . Social Herbalist on phone: Not on file    Gets together: Not on file    Attends religious service: Not on file    Active member of club or organization: Not on file    Attends meetings of clubs or organizations: Not on file    Relationship status: Not on file  . Intimate partner violence    Fear of current or ex partner: Not on file    Emotionally abused: Not on file    Physically abused: Not on file    Forced sexual activity: Not on file  Other Topics Concern  . Not on file  Social History Narrative  . Not on file    Outpatient Medications Prior to Visit  Medication Sig Dispense Refill  . folic acid (FOLVITE) 1 MG tablet Take 1 tablet (1  mg total) by mouth daily. 30 tablet 2  . hydroxyurea (HYDREA) 500 MG capsule Take 2 capsules (1,000 mg total) by mouth daily. May take with food to minimize GI side effects. 60 capsule 2  . ibuprofen (ADVIL) 800 MG tablet Take 1 tablet (800 mg total) by mouth every 8 (eight) hours as needed for moderate pain. 90 tablet 2  . Oxycodone HCl 10 MG TABS Take 1 tablet (10 mg total) by mouth every 8 (eight) hours as needed (for pain). 30 tablet 0   No facility-administered medications prior to visit.     No Known Allergies  ROS Review of Systems  Constitutional: Negative.   HENT: Negative.   Eyes: Negative.   Respiratory: Negative.   Cardiovascular: Negative.   Gastrointestinal: Negative.   Endocrine: Negative.   Genitourinary: Negative.    Musculoskeletal: Negative.   Skin: Negative.   Allergic/Immunologic: Negative.   Neurological: Negative.   Hematological: Negative.   Psychiatric/Behavioral: Negative.       Objective:    Physical Exam  BP 103/62 (BP Location: Right Arm, Patient Position: Sitting, Cuff Size: Normal)   Pulse 69   Temp 98.3 F (36.8 C) (Oral)   Resp 14   Ht 5\' 11"  (1.803 m)   Wt 158 lb (71.7 kg)   SpO2 94%   BMI 22.04 kg/m  Wt Readings from Last 3 Encounters:  03/12/19 158 lb (71.7 kg)  12/04/18 156 lb (70.8 kg)  02/15/18 158 lb (71.7 kg)   BP 103/62 (BP Location: Right Arm, Patient Position: Sitting, Cuff Size: Normal)   Pulse 69   Temp 98.3 F (36.8 C) (Oral)   Resp 14   Ht 5\' 11"  (1.803 m)   Wt 158 lb (71.7 kg)   SpO2 94%   BMI 22.04 kg/m   General Appearance:    Alert, cooperative, no distress, appears stated age  Head:    Normocephalic, without obvious abnormality, atraumatic  Eyes:    PERRL, conjunctiva/corneas clear, EOM's intact, fundi    benign, both eyes       Ears:    Normal TM's and external ear canals, both ears  Nose:   Nares normal, septum midline, mucosa normal, no drainage   or sinus tenderness  Throat:   Lips, mucosa, and tongue normal; teeth and gums normal  Neck:   Supple, symmetrical, trachea midline, no adenopathy;       thyroid:  No enlargement/tenderness/nodules; no carotid   bruit or JVD  Back:     Symmetric, no curvature, ROM normal, no CVA tenderness  Lungs:     Clear to auscultation bilaterally, respirations unlabored  Chest wall:    No tenderness or deformity  Heart:    Regular rate and rhythm, S1 and S2 normal, no murmur, rub   or gallop  Abdomen:     Soft, non-tender, bowel sounds active all four quadrants,    no masses, no organomegaly  Extremities:   Extremities normal, atraumatic, no cyanosis or edema  Pulses:   2+ and symmetric all extremities  Skin:   Skin color, texture, turgor normal, no rashes or lesions  Lymph nodes:   Cervical,  supraclavicular, and axillary nodes normal  Neurologic:   CNII-XII intact. Normal strength, sensation and reflexes      throughout    There are no preventive care reminders to display for this patient.  There are no preventive care reminders to display for this patient.  No results found for: TSH Lab Results  Component Value Date  WBC 13.3 (H) 09/28/2018   HGB 7.4 (L) 09/28/2018   HCT 21.9 (L) 09/28/2018   MCV 73.7 (L) 09/28/2018   PLT 500 (H) 09/28/2018   Lab Results  Component Value Date   NA 138 09/28/2018   K 4.6 09/28/2018   CO2 24 09/28/2018   GLUCOSE 98 09/28/2018   BUN 6 09/28/2018   CREATININE 0.70 09/28/2018   BILITOT 3.5 (H) 09/28/2018   ALKPHOS 48 09/28/2018   AST 53 (H) 09/28/2018   ALT 22 09/28/2018   PROT 7.8 09/28/2018   ALBUMIN 4.1 09/28/2018   CALCIUM 8.6 (L) 09/28/2018   ANIONGAP 5 09/28/2018   No results found for: CHOL No results found for: HDL No results found for: LDLCALC No results found for: TRIG No results found for: CHOLHDL No results found for: IEPP2R    Assessment & Plan:   Problem List Items Addressed This Visit      Respiratory   Hb-SS disease with acute chest syndrome (HCC) - Primary     Musculoskeletal and Integument   Avascular necrosis of right femoral head (HCC)     Other   Hb-SS disease without crisis (HCC)   Relevant Orders   Urinalysis Dipstick (Completed)   Vitamin D, 25-hydroxy   Comprehensive metabolic panel   CBC with Differential   Reticulocytes   Ambulatory referral to Ophthalmology       Hb-SS disease without crisis (HCC) Sickle cell disease - Continue Hydrea. We discussed the need for good hydration, monitoring of hydration status, avoidance of heat, cold, stress, and infection triggers. We discussed the risks and benefits of Hydrea, including bone marrow suppression, the possibility of GI upset, skin ulcers, hair thinning, and teratogenicity. The patient was reminded of the need to seek medical  attention of any symptoms of bleeding, anemia, or infection. Continue folic acid 1 mg daily to prevent aplastic bone marrow crises.   Pulmonary evaluation - Patient denies severe recurrent wheezes, shortness of breath with exercise, or persistent cough. If these symptoms develop, pulmonary function tests with spirometry will be ordered, and if abnormal, plan on referral to Pulmonology for further evaluation.  Cardiac - Routine screening for pulmonary hypertension is not recommended.  Eye - High risk of proliferative retinopathy. Annual eye exam with retinal exam recommended to patient.   Immunization status -  patient received influenza vaccination on today.  He is up-to-date on all other vaccinations.   Daily - Urinalysis Dipstick - Vitamin D, 25-hydroxy - Comprehensive metabolic panel - CBC with Differential - Reticulocytes - Ambulatory referral to Ophthalmology   Follow-up: Return in about 6 months (around 09/09/2019) for sickle cell anemia.    Nolon Nations  APRN, MSN, FNP-C [facility] [address] [place], [place] [address] [phone]

## 2019-03-12 NOTE — Patient Instructions (Signed)
Sickle Cell Anemia, Adult ° °Sickle cell anemia is a condition where your red blood cells are shaped like sickles. Red blood cells carry oxygen through the body. Sickle-shaped cells do not live as long as normal red blood cells. They also clump together and block blood from flowing through the blood vessels. This prevents the body from getting enough oxygen. Sickle cell anemia causes organ damage and pain. It also increases the risk of infection. °Follow these instructions at home: °Medicines °· Take over-the-counter and prescription medicines only as told by your doctor. °· If you were prescribed an antibiotic medicine, take it as told by your doctor. Do not stop taking the antibiotic even if you start to feel better. °· If you develop a fever, do not take medicines to lower the fever right away. Tell your doctor about the fever. °Managing pain, stiffness, and swelling °· Try these methods to help with pain: °? Use a heating pad. °? Take a warm bath. °? Distract yourself, such as by watching TV. °Eating and drinking °· Drink enough fluid to keep your pee (urine) clear or pale yellow. Drink more in hot weather and during exercise. °· Limit or avoid alcohol. °· Eat a healthy diet. Eat plenty of fruits, vegetables, whole grains, and lean protein. °· Take vitamins and supplements as told by your doctor. °Traveling °· When traveling, keep these with you: °? Your medical information. °? The names of your doctors. °? Your medicines. °· If you need to take an airplane, talk to your doctor first. °Activity °· Rest often. °· Avoid exercises that make your heart beat much faster, such as jogging. °General instructions °· Do not use products that have nicotine or tobacco, such as cigarettes and e-cigarettes. If you need help quitting, ask your doctor. °· Consider wearing a medical alert bracelet. °· Avoid being in high places (high altitudes), such as mountains. °· Avoid very hot or cold temperatures. °· Avoid places where the  temperature changes a lot. °· Keep all follow-up visits as told by your doctor. This is important. °Contact a doctor if: °· A joint hurts. °· Your feet or hands hurt or swell. °· You feel tired (fatigued). °Get help right away if: °· You have symptoms of infection. These include: °? Fever. °? Chills. °? Being very tired. °? Irritability. °? Poor eating. °? Throwing up (vomiting). °· You feel dizzy or faint. °· You have new stomach pain, especially on the left side. °· You have a an erection (priapism) that lasts more than 4 hours. °· You have numbness in your arms or legs. °· You have a hard time moving your arms or legs. °· You have trouble talking. °· You have pain that does not go away when you take medicine. °· You are short of breath. °· You are breathing fast. °· You have a long-term cough. °· You have pain in your chest. °· You have a bad headache. °· You have a stiff neck. °· Your stomach looks bloated even though you did not eat much. °· Your skin is pale. °· You suddenly cannot see well. °Summary °· Sickle cell anemia is a condition where your red blood cells are shaped like sickles. °· Follow your doctor's advice on ways to manage pain, food to eat, activities to do, and steps to take for safe travel. °· Get medical help right away if you have any signs of infection, such as a fever. °This information is not intended to replace advice given to you by   your health care provider. Make sure you discuss any questions you have with your health care provider. °Document Released: 02/13/2013 Document Revised: 08/17/2018 Document Reviewed: 05/31/2016 °Elsevier Patient Education © 2020 [employer] ° °

## 2019-03-13 ENCOUNTER — Other Ambulatory Visit: Payer: Self-pay | Admitting: Family Medicine

## 2019-03-13 ENCOUNTER — Telehealth: Payer: Self-pay

## 2019-03-13 DIAGNOSIS — E559 Vitamin D deficiency, unspecified: Secondary | ICD-10-CM

## 2019-03-13 LAB — VITAMIN D 25 HYDROXY (VIT D DEFICIENCY, FRACTURES): Vit D, 25-Hydroxy: 9.7 ng/mL — ABNORMAL LOW (ref 30.0–100.0)

## 2019-03-13 LAB — COMPREHENSIVE METABOLIC PANEL
ALT: 16 IU/L (ref 0–44)
AST: 32 IU/L (ref 0–40)
Albumin/Globulin Ratio: 1.3 (ref 1.2–2.2)
Albumin: 4.3 g/dL (ref 4.1–5.2)
Alkaline Phosphatase: 51 IU/L (ref 39–117)
BUN/Creatinine Ratio: 5 — ABNORMAL LOW (ref 9–20)
BUN: 4 mg/dL — ABNORMAL LOW (ref 6–20)
Bilirubin Total: 4.2 mg/dL — ABNORMAL HIGH (ref 0.0–1.2)
CO2: 19 mmol/L — ABNORMAL LOW (ref 20–29)
Calcium: 9.2 mg/dL (ref 8.7–10.2)
Chloride: 104 mmol/L (ref 96–106)
Creatinine, Ser: 0.8 mg/dL (ref 0.76–1.27)
GFR calc Af Amer: 144 mL/min/{1.73_m2} (ref >59)
GFR calc non Af Amer: 124 mL/min/{1.73_m2} (ref >59)
Globulin, Total: 3.3 g/dL (ref 1.5–4.5)
Glucose: 94 mg/dL (ref 65–99)
Potassium: 4.7 mmol/L (ref 3.5–5.2)
Sodium: 137 mmol/L (ref 134–144)
Total Protein: 7.6 g/dL (ref 6.0–8.5)

## 2019-03-13 LAB — CBC WITH DIFFERENTIAL/PLATELET
Basophils Absolute: 0.1 10*3/uL (ref 0.0–0.2)
Basos: 1 %
EOS (ABSOLUTE): 0.9 10*3/uL — ABNORMAL HIGH (ref 0.0–0.4)
Eos: 6 %
Hematocrit: 23.3 % — ABNORMAL LOW (ref 37.5–51.0)
Hemoglobin: 7.6 g/dL — ABNORMAL LOW (ref 13.0–17.7)
Immature Grans (Abs): 0 10*3/uL (ref 0.0–0.1)
Immature Granulocytes: 0 %
Lymphocytes Absolute: 7.2 10*3/uL — ABNORMAL HIGH (ref 0.7–3.1)
Lymphs: 53 %
MCH: 24.1 pg — ABNORMAL LOW (ref 26.6–33.0)
MCHC: 32.6 g/dL (ref 31.5–35.7)
MCV: 74 fL — ABNORMAL LOW (ref 79–97)
Monocytes Absolute: 1.4 10*3/uL — ABNORMAL HIGH (ref 0.1–0.9)
Monocytes: 10 %
NRBC: 2 % — ABNORMAL HIGH (ref 0–0)
Neutrophils Absolute: 4.1 10*3/uL (ref 1.4–7.0)
Neutrophils: 30 %
Platelets: 554 10*3/uL — ABNORMAL HIGH (ref 150–450)
RBC: 3.15 x10E6/uL — ABNORMAL LOW (ref 4.14–5.80)
RDW: 26.2 % — ABNORMAL HIGH (ref 11.6–15.4)
WBC: 13.6 10*3/uL — ABNORMAL HIGH (ref 3.4–10.8)

## 2019-03-13 LAB — RETICULOCYTES: Retic Ct Pct: 8.9 % — ABNORMAL HIGH (ref 0.6–2.6)

## 2019-03-13 MED ORDER — ERGOCALCIFEROL 1.25 MG (50000 UT) PO CAPS: 50000.0000 [IU] | ORAL_CAPSULE | ORAL | 1 refills | Status: DC

## 2019-03-13 NOTE — Telephone Encounter (Signed)
-----   Message from Dorena Dew, [place] sent at 03/13/2019  4:15 PM EST ----- Regarding: lab results Please inform patient vitamin D is low. He warrants weekly supplement. Sent drisdol to patient's pharmacy. Also, recommend dietary liver, tuna, soy milk, sardines, cheese are a few other foods that contain vitamin D.  Patient's hemoglobin is 7.6, which is consistent with his baseline.  There is no clinical indication for blood transfusion at this time.  Please follow-up in office as scheduled.    Donia Pounds  APRN, MSN, FNP-C Patient [facility] [address] [place],  [address] [phone]

## 2019-03-13 NOTE — Progress Notes (Signed)
Meds ordered this encounter  Medications   ergocalciferol (VITAMIN D2) 1.25 MG (50000 UT) capsule    Sig: Take 1 capsule (50,000 Units total) by mouth once a week.    Dispense:  12 capsule    Refill:  1    Order Specific Question:   Supervising Provider    Answer:   JEGEDE, OLUGBEMIGA E [[id number]]   Dalon Reichart Moore Jamari Diana  APRN, MSN, FNP-C [facility] [address]  [place], [place] [address] [phone]  

## 2019-03-13 NOTE — Telephone Encounter (Signed)
Called and spoke with patient. Informed that vitamin D was low and that he should start taking once a week supplement that was sent into pharmacy. Also recommended that he include vitamin D rich foods in diet such as soy milk, cheese, liver and tuna. Advised that hgb was 7.6 which is consistent with baseline and no indication for transfusion at this time. Asked to keep next scheduled appointment. Thanks!

## 2019-06-12 ENCOUNTER — Other Ambulatory Visit: Payer: Self-pay

## 2019-09-17 ENCOUNTER — Ambulatory Visit: Payer: Self-pay | Admitting: Family Medicine

## 2020-04-14 ENCOUNTER — Telehealth (INDEPENDENT_AMBULATORY_CARE_PROVIDER_SITE_OTHER): Payer: Worker's Compensation | Admitting: Family Medicine

## 2020-04-14 ENCOUNTER — Other Ambulatory Visit: Payer: Self-pay

## 2020-04-14 VITALS — Ht 73.0 in | Wt 167.0 lb

## 2020-04-14 DIAGNOSIS — G894 Chronic pain syndrome: Secondary | ICD-10-CM | POA: Diagnosis not present

## 2020-04-14 DIAGNOSIS — D571 Sickle-cell disease without crisis: Secondary | ICD-10-CM

## 2020-04-14 MED ORDER — FOLIC ACID 1 MG PO TABS: 1.0000 mg | ORAL_TABLET | Freq: Every day | ORAL | 2 refills | Status: AC

## 2020-04-14 MED ORDER — OXYCODONE HCL 5 MG PO TABS: 5.0000 mg | ORAL_TABLET | ORAL | 0 refills | Status: DC | PRN

## 2020-04-14 MED ORDER — HYDROXYUREA 500 MG PO CAPS: 1000.0000 mg | ORAL_CAPSULE | Freq: Every day | ORAL | 2 refills | Status: AC

## 2020-04-14 MED ORDER — IBUPROFEN 800 MG PO TABS: 800.0000 mg | ORAL_TABLET | Freq: Three times a day (TID) | ORAL | 2 refills | Status: DC | PRN

## 2020-04-14 NOTE — Progress Notes (Signed)
Needs RF on all meds

## 2020-04-14 NOTE — Progress Notes (Signed)
Virtual Visit via Telephone Note  I connected with Nicolas Rogers on 04/14/20 at  1:20 PM EST by telephone and verified that I am speaking with the correct person using two identifiers.  Location: Patient: Home Provider: [place] [facility]   I discussed the limitations, risks, security and privacy concerns of performing an evaluation and management service by telephone and the availability of in person appointments. I also discussed with the patient that there may be a patient responsible charge related to this service. The patient expressed understanding and agreed to proceed.   History of Present Illness: Nicolas Rogers is a 24 year old male with a medical history significant for sickle cell disease that presents via telephone for follow-up of chronic conditions.  Patient has been lost to follow-up over the past 6 months.  He has very well-controlled sickle cell disease with infrequent pain crisis.  He says that on yesterday he had a sudden increase of pain upon awakening.  He feels that he slept without a blanket and became extremely cold.  Pain was primarily to low back and lower extremities.  Patient says that sickle cell pain has completely resolved today.  He is not having any pain at this time.  He denies any headache, chest pain, urinary symptoms, nausea, vomiting, or diarrhea. Past Medical History:  Diagnosis Date  . Anxiety   . Cholecystolithiasis   . Major depressive disorder   . Schizophrenia (HCC)   . Sickle cell anemia (HCC)    Social History   Socioeconomic History  . Marital status: Single    Spouse name: Not on file  . Number of children: Not on file  . Years of education: Not on file  . Highest education level: Not on file  Occupational History  . Not on file  Tobacco Use  . Smoking status: Never Smoker  . Smokeless tobacco: Never Used  Vaping Use  . Vaping Use: Never used  Substance and Sexual Activity  . Alcohol use: No  . Drug use: No  .  Sexual activity: Not on file  Other Topics Concern  . Not on file  Social History Narrative  . Not on file   Social Determinants of Health   Financial Resource Strain:   . Difficulty of Paying Living Expenses: Not on file  Food Insecurity:   . Worried About Programme researcher, broadcasting/film/video in the Last Year: Not on file  . Ran Out of Food in the Last Year: Not on file  Transportation Needs:   . Lack of Transportation (Medical): Not on file  . Lack of Transportation (Non-Medical): Not on file  Physical Activity:   . Days of Exercise per Week: Not on file  . Minutes of Exercise per Session: Not on file  Stress:   . Feeling of Stress : Not on file  Social Connections:   . Frequency of Communication with Friends and Family: Not on file  . Frequency of Social Gatherings with Friends and Family: Not on file  . Attends Religious Services: Not on file  . Active Member of Clubs or Organizations: Not on file  . Attends Banker Meetings: Not on file  . Marital Status: Not on file  Intimate Partner Violence:   . Fear of Current or Ex-Partner: Not on file  . Emotionally Abused: Not on file  . Physically Abused: Not on file  . Sexually Abused: Not on file   Immunization History  Administered Date(s) Administered  . Influenza,inj,Quad PF,6+ Mos 03/12/2014, 02/23/2015,  02/15/2017, 02/15/2018, 03/12/2019  . PFIZER SARS-COV-2 Vaccination 03/18/2020  . Pneumococcal Conjugate-13 02/23/2015  . Pneumococcal Polysaccharide-23 12/09/2013  . Tdap 09/08/2012    Assessment and Plan: 1. Hb-SS disease without crisis (HCC)  Sickle cell disease - Continue Hydrea 1000 mg. We discussed the need for good hydration, monitoring of hydration status, avoidance of heat, cold, stress, and infection triggers. We discussed the risks and benefits of Hydrea, including bone marrow suppression, the possibility of GI upset, skin ulcers, hair thinning, and teratogenicity. The patient was reminded of the need to seek  medical attention of any symptoms of bleeding, anemia, or infection. Continue folic acid 1 mg daily to prevent aplastic bone marrow crises.    Pulmonary evaluation - Patient denies severe recurrent wheezes, shortness of breath with exercise, or persistent cough. If these symptoms develop, pulmonary function tests with spirometry will be ordered, and if abnormal, plan on referral to Pulmonology for further evaluation.   Cardiac - Routine screening for pulmonary hypertension is not recommended.  Eye - High risk of proliferative retinopathy. Annual eye exam with retinal exam recommended to patient.  Immunization status - [place] is up to date with all vaccinations  Acute and chronic painful episodes - Infrequent crises, no medication changes warranted.    - ibuprofen (ADVIL) 800 MG tablet; Take 1 tablet (800 mg total) by mouth every 8 (eight) hours as needed for moderate pain.  Dispense: 90 tablet; Refill: 2 - hydroxyurea (HYDREA) 500 MG capsule; Take 2 capsules (1,000 mg total) by mouth daily. May take with food to minimize GI side effects.  Dispense: 60 capsule; Refill: 2 - folic acid (FOLVITE) 1 MG tablet; Take 1 tablet (1 mg total) by mouth daily.  Dispense: 30 tablet; Refill: 2 - Sickle Cell Panel; Future  Chronic pain syndrome Reviewed PDMP substance reporting system prior to prescribing opiate medications. No inconsistencies noted.    - oxyCODONE (ROXICODONE) 5 MG immediate release tablet; Take 1 tablet (5 mg total) by mouth every 4 (four) hours as needed for severe pain.  Dispense: 30 tablet; Refill: 0   Follow Up Instructions:    I discussed the assessment and treatment plan with the patient. The patient was provided an opportunity to ask questions and all were answered. The patient agreed with the plan and demonstrated an understanding of the instructions.   The patient was advised to call back or seek an in-person evaluation if the symptoms worsen or if the condition fails to  improve as anticipated.  I provided 10 minutes of non-face-to-face time during this encounter.   Nolon Nations  APRN, MSN, FNP-C Patient Care [facility] [address] [place], [place] [address] [phone]

## 2020-04-21 ENCOUNTER — Other Ambulatory Visit: Payer: Worker's Compensation

## 2020-04-21 ENCOUNTER — Other Ambulatory Visit: Payer: Self-pay

## 2020-04-21 ENCOUNTER — Other Ambulatory Visit: Payer: Self-pay | Admitting: Family Medicine

## 2020-04-21 DIAGNOSIS — D571 Sickle-cell disease without crisis: Secondary | ICD-10-CM

## 2020-04-21 DIAGNOSIS — G894 Chronic pain syndrome: Secondary | ICD-10-CM

## 2020-04-21 DIAGNOSIS — E559 Vitamin D deficiency, unspecified: Secondary | ICD-10-CM

## 2020-04-21 NOTE — Progress Notes (Signed)
Orders Placed This Encounter  Procedures  . Sickle Cell Panel  . [id number] 11+Oxyco+Alc+Crt-Bund     Nicolas Nations  APRN, MSN, FNP-C Patient Care [facility] [address] [place], [place] [address] [phone]

## 2020-04-22 LAB — CMP14+CBC/D/PLT+FER+RETIC+V...
ALT: 19 IU/L (ref 0–44)
AST: 72 IU/L — ABNORMAL HIGH (ref 0–40)
Albumin/Globulin Ratio: 1.3 (ref 1.2–2.2)
Albumin: 4.3 g/dL (ref 4.1–5.2)
Alkaline Phosphatase: 46 IU/L (ref 44–121)
BUN/Creatinine Ratio: 6 — ABNORMAL LOW (ref 9–20)
BUN: 5 mg/dL — ABNORMAL LOW (ref 6–20)
Basophils Absolute: 0.1 10*3/uL (ref 0.0–0.2)
Basos: 0 %
Bilirubin Total: 4.2 mg/dL — ABNORMAL HIGH (ref 0.0–1.2)
CO2: 21 mmol/L (ref 20–29)
Calcium: 9.1 mg/dL (ref 8.7–10.2)
Chloride: 101 mmol/L (ref 96–106)
Creatinine, Ser: 0.84 mg/dL (ref 0.76–1.27)
EOS (ABSOLUTE): 0.9 10*3/uL — ABNORMAL HIGH (ref 0.0–0.4)
Eos: 8 %
Ferritin: 68 ng/mL (ref 30–400)
GFR calc Af Amer: 140 mL/min/{1.73_m2} (ref >59)
GFR calc non Af Amer: 121 mL/min/{1.73_m2} (ref >59)
Globulin, Total: 3.2 g/dL (ref 1.5–4.5)
Glucose: 80 mg/dL (ref 65–99)
Hematocrit: 21.4 % — ABNORMAL LOW (ref 37.5–51.0)
Hemoglobin: 7.1 g/dL — ABNORMAL LOW (ref 13.0–17.7)
Immature Grans (Abs): 0 10*3/uL (ref 0.0–0.1)
Immature Granulocytes: 0 %
Lymphocytes Absolute: 5 10*3/uL — ABNORMAL HIGH (ref 0.7–3.1)
Lymphs: 42 %
MCH: 25.8 pg — ABNORMAL LOW (ref 26.6–33.0)
MCHC: 33.2 g/dL (ref 31.5–35.7)
MCV: 78 fL — ABNORMAL LOW (ref 79–97)
Monocytes Absolute: 0.8 10*3/uL (ref 0.1–0.9)
Monocytes: 6 %
NRBC: 2 % — ABNORMAL HIGH (ref 0–0)
Neutrophils Absolute: 5 10*3/uL (ref 1.4–7.0)
Neutrophils: 44 %
Platelets: 417 10*3/uL (ref 150–450)
Potassium: 5.9 mmol/L — ABNORMAL HIGH (ref 3.5–5.2)
RBC: 2.75 x10E6/uL — ABNORMAL LOW (ref 4.14–5.80)
RDW: 25.8 % — ABNORMAL HIGH (ref 11.6–15.4)
Retic Ct Pct: 7.1 % — ABNORMAL HIGH (ref 0.6–2.6)
Sodium: 132 mmol/L — ABNORMAL LOW (ref 134–144)
Total Protein: 7.5 g/dL (ref 6.0–8.5)
Vit D, 25-Hydroxy: 12.1 ng/mL — ABNORMAL LOW (ref 30.0–100.0)
WBC: 11.7 10*3/uL — ABNORMAL HIGH (ref 3.4–10.8)

## 2020-04-24 ENCOUNTER — Telehealth: Payer: Self-pay

## 2020-04-24 NOTE — Telephone Encounter (Signed)
Called an spoke with patient regarding his lab results, advised pt to being taking vit d 50,000 units once a week ,stayed hydrated and follow up in 1 month for his follow up appt . Pt understood his results w/o any concerns.

## 2020-04-24 NOTE — Telephone Encounter (Signed)
Called and lvm for pt call back about lab results.

## 2020-04-24 NOTE — Telephone Encounter (Signed)
-----   Message from Massie Maroon, [place] sent at 04/23/2020  3:50 PM EST ----- Regarding: lab results Please inform patient that vitamin D level is 12.1, which is markedly decreased.  We will continue Drisdol 50,000 IUs/week.  Also, recommend vitamin D fortified foods like milk, yogurt, sardines, salmon, etc.  Also, patient's hemoglobin is decreased to 7.1, however it is consistent with patient's baseline.  Continue folic acid 1 mg daily.  Also, hydrate with 64 ounces of fluid per day.  Remind patient that sickle cell day Hospital is available for day treatment for acute sickle cell pain crisis.  Remind patient of available times.  Follow-up as scheduled   Nolon Nations  APRN, MSN, FNP-C Patient Care [facility] [address] [place], [place] [address] [phone]

## 2020-04-24 NOTE — Telephone Encounter (Signed)
-----   Message from Lachina M Hollis, FNP sent at 04/23/2020  3:50 PM EST ----- Regarding: lab results Please inform patient that vitamin D level is 12.1, which is markedly decreased.  We will continue Drisdol 50,000 IUs/week.  Also, recommend vitamin D fortified foods like milk, yogurt, sardines, salmon, etc.  Also, patient's hemoglobin is decreased to 7.1, however it is consistent with patient's baseline.  Continue folic acid 1 mg daily.  Also, hydrate with 64 ounces of fluid per day.  Remind patient that sickle cell day Hospital is available for day treatment for acute sickle cell pain crisis.  Remind patient of available times.  Follow-up as scheduled   Lachina Moore Hollis  APRN, MSN, FNP-C [facility] [address]  [place], [place] [address] [phone]    

## 2020-06-02 ENCOUNTER — Ambulatory Visit: Payer: Self-pay | Admitting: Family Medicine

## 2022-03-23 ENCOUNTER — Inpatient Hospital Stay (HOSPITAL_COMMUNITY)
Admission: EM | Admit: 2022-03-23 | Discharge: 2022-03-26 | DRG: 812 | Disposition: A | Discharge status: Home / Self Care | Payer: Self-pay | Attending: Internal Medicine | Admitting: Internal Medicine

## 2022-03-23 ENCOUNTER — Encounter (HOSPITAL_COMMUNITY): Payer: Self-pay

## 2022-03-23 ENCOUNTER — Emergency Department (HOSPITAL_COMMUNITY): Payer: Self-pay

## 2022-03-23 ENCOUNTER — Other Ambulatory Visit: Payer: Self-pay

## 2022-03-23 DIAGNOSIS — F329 Major depressive disorder, single episode, unspecified: Secondary | ICD-10-CM | POA: Diagnosis present

## 2022-03-23 DIAGNOSIS — Z23 Encounter for immunization: Secondary | ICD-10-CM

## 2022-03-23 DIAGNOSIS — F419 Anxiety disorder, unspecified: Secondary | ICD-10-CM | POA: Diagnosis present

## 2022-03-23 DIAGNOSIS — R0902 Hypoxemia: Secondary | ICD-10-CM | POA: Diagnosis present

## 2022-03-23 DIAGNOSIS — D5700 Hb-SS disease with crisis, unspecified: Principal | ICD-10-CM | POA: Diagnosis present

## 2022-03-23 DIAGNOSIS — Z79899 Other long term (current) drug therapy: Secondary | ICD-10-CM

## 2022-03-23 DIAGNOSIS — Z9049 Acquired absence of other specified parts of digestive tract: Secondary | ICD-10-CM

## 2022-03-23 DIAGNOSIS — D571 Sickle-cell disease without crisis: Secondary | ICD-10-CM | POA: Diagnosis present

## 2022-03-23 DIAGNOSIS — F411 Generalized anxiety disorder: Secondary | ICD-10-CM | POA: Diagnosis present

## 2022-03-23 DIAGNOSIS — D75838 Other thrombocytosis: Secondary | ICD-10-CM | POA: Diagnosis present

## 2022-03-23 DIAGNOSIS — D72829 Elevated white blood cell count, unspecified: Secondary | ICD-10-CM | POA: Diagnosis present

## 2022-03-23 LAB — CBC WITH DIFFERENTIAL/PLATELET
Abs Immature Granulocytes: 0.36 10*3/uL — ABNORMAL HIGH (ref 0.00–0.07)
Basophils Absolute: 0.1 10*3/uL (ref 0.0–0.1)
Basophils Relative: 1 %
Eosinophils Absolute: 1 10*3/uL — ABNORMAL HIGH (ref 0.0–0.5)
Eosinophils Relative: 8 %
HCT: 19.9 % — ABNORMAL LOW (ref 39.0–52.0)
Hemoglobin: 6.8 g/dL — CL (ref 13.0–17.0)
Immature Granulocytes: 3 %
Lymphocytes Relative: 32 %
Lymphs Abs: 3.8 10*3/uL (ref 0.7–4.0)
MCH: 25.4 pg — ABNORMAL LOW (ref 26.0–34.0)
MCHC: 34.2 g/dL (ref 30.0–36.0)
MCV: 74.3 fL — ABNORMAL LOW (ref 80.0–100.0)
Monocytes Absolute: 0.8 10*3/uL (ref 0.1–1.0)
Monocytes Relative: 7 %
Neutro Abs: 5.9 10*3/uL (ref 1.7–7.7)
Neutrophils Relative %: 49 %
Platelets: 460 10*3/uL — ABNORMAL HIGH (ref 150–400)
RBC: 2.68 MIL/uL — ABNORMAL LOW (ref 4.22–5.81)
RDW: 27.9 % — ABNORMAL HIGH (ref 11.5–15.5)
WBC: 11.9 10*3/uL — ABNORMAL HIGH (ref 4.0–10.5)
nRBC: 0.6 % — ABNORMAL HIGH (ref 0.0–0.2)

## 2022-03-23 LAB — COMPREHENSIVE METABOLIC PANEL
ALT: 14 U/L (ref 0–44)
AST: 37 U/L (ref 15–41)
Albumin: 3.9 g/dL (ref 3.5–5.0)
Alkaline Phosphatase: 45 U/L (ref 38–126)
Anion gap: 3 — ABNORMAL LOW (ref 5–15)
BUN: 6 mg/dL (ref 6–20)
CO2: 25 mmol/L (ref 22–32)
Calcium: 8.3 mg/dL — ABNORMAL LOW (ref 8.9–10.3)
Chloride: 112 mmol/L — ABNORMAL HIGH (ref 98–111)
Creatinine, Ser: 0.49 mg/dL — ABNORMAL LOW (ref 0.61–1.24)
GFR, Estimated: >60 mL/min (ref >60)
Glucose, Bld: 88 mg/dL (ref 70–99)
Potassium: 3.8 mmol/L (ref 3.5–5.1)
Sodium: 140 mmol/L (ref 135–145)
Total Bilirubin: 4.8 mg/dL — ABNORMAL HIGH (ref 0.3–1.2)
Total Protein: 8 g/dL (ref 6.5–8.1)

## 2022-03-23 LAB — RETICULOCYTES
Immature Retic Fract: 48.2 % — ABNORMAL HIGH (ref 2.3–15.9)
RBC.: 2.65 MIL/uL — ABNORMAL LOW (ref 4.22–5.81)
Retic Count, Absolute: 188.4 10*3/uL — ABNORMAL HIGH (ref 19.0–186.0)
Retic Ct Pct: 7.1 % — ABNORMAL HIGH (ref 0.4–3.1)

## 2022-03-23 MED ORDER — HYDROMORPHONE HCL 2 MG/ML IJ SOLN
2.0000 mg | INTRAMUSCULAR | Status: AC
  Administered 2022-03-23: 2 mg via INTRAVENOUS
  Filled 2022-03-23: qty 1

## 2022-03-23 MED ORDER — PROCHLORPERAZINE EDISYLATE 10 MG/2ML IJ SOLN
10.0000 mg | Freq: Once | INTRAMUSCULAR | Status: AC
  Administered 2022-03-23: 10 mg via INTRAVENOUS
  Filled 2022-03-23: qty 2

## 2022-03-23 MED ORDER — ONDANSETRON HCL 4 MG/2ML IJ SOLN
4.0000 mg | Freq: Four times a day (QID) | INTRAMUSCULAR | Status: DC | PRN
  Administered 2022-03-25 – 2022-03-26 (×2): 4 mg via INTRAVENOUS
  Filled 2022-03-23 (×2): qty 2

## 2022-03-23 MED ORDER — SODIUM CHLORIDE 0.9% FLUSH: 9.0000 mL | INTRAVENOUS | Status: DC | PRN

## 2022-03-23 MED ORDER — HYDROXYUREA 500 MG PO CAPS
1000.0000 mg | ORAL_CAPSULE | Freq: Every day | ORAL | Status: DC
  Administered 2022-03-24 – 2022-03-26 (×3): 1000 mg via ORAL
  Filled 2022-03-23 (×3): qty 2

## 2022-03-23 MED ORDER — DIPHENHYDRAMINE HCL 25 MG PO CAPS: 25.0000 mg | ORAL_CAPSULE | ORAL | Status: DC | PRN

## 2022-03-23 MED ORDER — ENOXAPARIN SODIUM 40 MG/0.4ML IJ SOSY
40.0000 mg | PREFILLED_SYRINGE | Freq: Every day | INTRAMUSCULAR | Status: DC
  Administered 2022-03-23 – 2022-03-25 (×3): 40 mg via SUBCUTANEOUS
  Filled 2022-03-23 (×3): qty 0.4

## 2022-03-23 MED ORDER — NALOXONE HCL 0.4 MG/ML IJ SOLN: 0.4000 mg | INTRAMUSCULAR | Status: DC | PRN

## 2022-03-23 MED ORDER — SENNOSIDES-DOCUSATE SODIUM 8.6-50 MG PO TABS
1.0000 | ORAL_TABLET | Freq: Two times a day (BID) | ORAL | Status: DC
  Administered 2022-03-23 – 2022-03-26 (×6): 1 via ORAL
  Filled 2022-03-23 (×6): qty 1

## 2022-03-23 MED ORDER — DIPHENHYDRAMINE HCL 50 MG/ML IJ SOLN
25.0000 mg | Freq: Once | INTRAMUSCULAR | Status: AC
  Administered 2022-03-23: 25 mg via INTRAVENOUS
  Filled 2022-03-23: qty 1

## 2022-03-23 MED ORDER — DEXTROSE-NACL 5-0.45 % IV SOLN: INTRAVENOUS | Status: DC

## 2022-03-23 MED ORDER — POLYETHYLENE GLYCOL 3350 17 G PO PACK: 17.0000 g | PACK | Freq: Every day | ORAL | Status: DC | PRN

## 2022-03-23 MED ORDER — KETOROLAC TROMETHAMINE 15 MG/ML IJ SOLN
15.0000 mg | INTRAMUSCULAR | Status: AC
  Administered 2022-03-23: 15 mg via INTRAVENOUS
  Filled 2022-03-23: qty 1

## 2022-03-23 MED ORDER — FOLIC ACID 1 MG PO TABS
1.0000 mg | ORAL_TABLET | Freq: Every day | ORAL | Status: DC
  Administered 2022-03-24 – 2022-03-26 (×3): 1 mg via ORAL
  Filled 2022-03-23 (×3): qty 1

## 2022-03-23 MED ORDER — ONDANSETRON HCL 4 MG/2ML IJ SOLN
4.0000 mg | Freq: Once | INTRAMUSCULAR | Status: AC
  Administered 2022-03-23: 4 mg via INTRAVENOUS
  Filled 2022-03-23: qty 2

## 2022-03-23 MED ORDER — OXYCODONE HCL 5 MG PO TABS
15.0000 mg | ORAL_TABLET | Freq: Once | ORAL | Status: AC
  Administered 2022-03-23: 15 mg via ORAL
  Filled 2022-03-23: qty 3

## 2022-03-23 MED ORDER — HYDROMORPHONE 1 MG/ML IV SOLN
INTRAVENOUS | Status: DC
  Administered 2022-03-23: 30 mg via INTRAVENOUS
  Administered 2022-03-24 – 2022-03-25 (×3): 0.1 mg via INTRAVENOUS
  Filled 2022-03-23: qty 30

## 2022-03-23 MED ORDER — INFLUENZA VAC SPLIT QUAD 0.5 ML IM SUSY
0.5000 mL | PREFILLED_SYRINGE | INTRAMUSCULAR | Status: AC
  Administered 2022-03-26: 0.5 mL via INTRAMUSCULAR
  Filled 2022-03-23: qty 0.5

## 2022-03-23 MED ORDER — KETOROLAC TROMETHAMINE 15 MG/ML IJ SOLN
15.0000 mg | Freq: Four times a day (QID) | INTRAMUSCULAR | Status: DC
  Administered 2022-03-23 – 2022-03-26 (×10): 15 mg via INTRAVENOUS
  Filled 2022-03-23 (×10): qty 1

## 2022-03-23 NOTE — ED Notes (Signed)
Pt in bed with eyes closed, pt emitting a snoring like noise. Pt remains on O2 sat and cardiac monitor.

## 2022-03-23 NOTE — ED Triage Notes (Signed)
Pt reports [facility] since this am with generalized pain Tylenol @ 0800

## 2022-03-23 NOTE — ED Notes (Signed)
Pt in bed, pt vomited a small amount and c/o nausea, md notified.

## 2022-03-23 NOTE — ED Notes (Signed)
Pt placed on 4L O2 via [place], pt now satting 99%

## 2022-03-23 NOTE — ED Provider Notes (Signed)
  Physical Exam  BP 102/61   Pulse (!) 57   Temp 97.9 F (36.6 C) (Oral)   Resp 16   Ht 6\' 1"  (1.854 m)   Wt 75 kg   SpO2 93%   BMI 21.81 kg/m   Physical Exam Constitutional:      General: He is not in acute distress.    Appearance: Normal appearance.  HENT:     Head: Normocephalic and atraumatic.     Nose: No congestion or rhinorrhea.  Eyes:     General:        Right eye: No discharge.        Left eye: No discharge.     Extraocular Movements: Extraocular movements intact.     Pupils: Pupils are equal, round, and reactive to light.  Cardiovascular:     Rate and Rhythm: Normal rate and regular rhythm.     Heart sounds: No murmur heard. Pulmonary:     Effort: No respiratory distress.     Breath sounds: No wheezing or rales.  Abdominal:     General: There is no distension.     Tenderness: There is no abdominal tenderness.  Musculoskeletal:        General: Normal range of motion.     Cervical back: Normal range of motion.  Skin:    General: Skin is warm and dry.  Neurological:     General: No focal deficit present.     Mental Status: He is alert.     Procedures  Procedures  ED Course / MDM   Clinical Course as of 03/23/22 1756  Wed Mar 23, 2022  1516 [facility], pending sickle cell team consult [MK]    Clinical Course User Index [MK] Lashai Grosch, [facility], MD   Medical Decision Making Risk Prescription drug management.   Patient received in handoff.  Sickle cell pain crisis with anemia, pending third dose of Dilaudid.  Previous physician spoke with sickle cell team who states that if patient pain is under control, they would not transfuse until patient has hemoglobin less than 6 and he could follow-up outpatient.  However, on my reevaluation, patient continues to have pain despite multiple rounds of Dilaudid and resumption of his home opioid pain medication.  Patient will require hospital admission for pain control.       Wyn Forster, MD 03/23/22 1757

## 2022-03-23 NOTE — H&P (Signed)
History and Physical    Patient: Nicolas Rogers DXI:[id number] DOB: [birth year] DOA: 03/23/2022 DOS: the patient was seen and examined on 03/23/2022 PCP: Massie Maroon, FNP  Patient coming from: Home  Chief Complaint:  Chief Complaint  Patient presents with   Sickle Cell Pain Crisis   HPI: Nicolas Rogers is a 26 y.o. male with medical history significant of sickle cell disease, schizophrenia, major depressive disorder, history of cholecystolithiasis, avascular necrosis among other things who has not been having his recurrent sickle cell crisis here now with pain in his back and legs consistent with his typical sickle cell crisis.  Patient has been taking ibuprofen and oxycodone at home but has run out. He only took Tylenol.  Patient was seen in the ER.  He received a number of doses of Dilaudid but still having no significant relief.  Patient will therefore be admitted to the hospital for further evaluation and treatment  Review of Systems: As mentioned in the history of present illness. All other systems reviewed and are negative. Past Medical History:  Diagnosis Date   Anxiety    Cholecystolithiasis    Major depressive disorder    Schizophrenia (HCC)    Sickle cell anemia (HCC)    Past Surgical History:  Procedure Laterality Date   ADENOIDECTOMY     CHOLECYSTECTOMY     TONSILLECTOMY     Social History:  reports that he has never smoked. He has never used smokeless tobacco. He reports that he does not drink alcohol and does not use drugs.  No Known Allergies  History reviewed. No pertinent family history.  Prior to Admission medications   Medication Sig Start Date End Date Taking? Authorizing Provider  ergocalciferol (VITAMIN D2) 1.25 MG (50000 UT) capsule Take 1 capsule (50,000 Units total) by mouth once a week. 03/13/19   Massie Maroon, FNP  folic acid (FOLVITE) 1 MG tablet Take 1 tablet (1 mg total) by mouth daily. 04/14/20   Massie Maroon, FNP  hydroxyurea  (HYDREA) 500 MG capsule Take 2 capsules (1,000 mg total) by mouth daily. May take with food to minimize GI side effects. 04/14/20   Massie Maroon, FNP  ibuprofen (ADVIL) 800 MG tablet Take 1 tablet (800 mg total) by mouth every 8 (eight) hours as needed for moderate pain. 04/14/20   Massie Maroon, FNP  oxyCODONE (ROXICODONE) 5 MG immediate release tablet Take 1 tablet (5 mg total) by mouth every 4 (four) hours as needed for severe pain. 04/14/20   Massie Maroon, FNP    Physical Exam: Vitals:   03/23/22 1800 03/23/22 1814 03/23/22 1830 03/23/22 1900  BP: 120/76  103/69 105/63  Pulse: (!) 50  66 (!) 58  Resp: 18  16 16   Temp:  98.1 F (36.7 C)    TempSrc:  Oral    SpO2: 95%  96% 97%  Weight:      Height:       Constitutional: Patient has mild distress due to pain.  Moving around in bed, comfortable Eyes: PERRL, lids and conjunctivae normal ENMT: Mucous membranes are moist. Posterior pharynx clear of any exudate or lesions.Normal dentition.  Neck: normal, supple, no masses, no thyromegaly Respiratory: clear to auscultation bilaterally, no wheezing, no crackles. Normal respiratory effort. No accessory muscle use.  Cardiovascular: Regular rate and rhythm, no murmurs / rubs / gallops. No extremity edema. 2+ pedal pulses. No carotid bruits.  Abdomen: no tenderness, no masses palpated. No hepatosplenomegaly. Bowel sounds positive.  Musculoskeletal: Good  range of motion, no joint swelling or tenderness, Skin: no rashes, lesions, ulcers. No induration Neurologic: CN 2-12 grossly intact. Sensation intact, DTR normal. Strength 5/5 in all 4.  Psychiatric: Normal judgment and insight. Alert and oriented x 3. Normal mood  Data Reviewed:  Sodium 140 potassium 3.8 chloride 112.  Creatinine 0.49 calcium 8.3.  Hemoglobin is 6.8 baseline usually 7-7.5.  White count is 11.9 and platelets 460.  Reticulocyte count absolute is 188 with reticulocyte percent of 7.1 which is close to his baseline also.   Chest x-ray showed no acute finding.  Assessment and Plan:  #1 acute sickle cell pain crisis: Patient takes oxycodone at home but largely close to being nave.  Admit the patient.  Initiate PCA protocol with Dilaudid.  IV Toradol.  D5 half-normal saline at 125 cc an hour.  Frequent pain checks.  Transition to oral therapy prior to discharge.  #2 anemia of chronic disease: Hemoglobin 6.8.  Slightly below baseline.  No transfusion at this point.  #3 history of schizoaffective disorder: Patient is calm and collected.  Not on any medication at this point.  We will defer.  #4 thrombocytosis: Most likely secondary to chronic sickle cell disease.  Continue to monitor    Advance Care Planning:   Code Status: Prior full code  Consults: None  Family Communication: No family at bedside  Severity of Illness: The appropriate patient status for this patient is INPATIENT. Inpatient status is judged to be reasonable and necessary in order to provide the required intensity of service to ensure the patient's safety. The patient's presenting symptoms, physical exam findings, and initial radiographic and laboratory data in the context of their chronic comorbidities is felt to place them at high risk for further clinical deterioration. Furthermore, it is not anticipated that the patient will be medically stable for discharge from the hospital within 2 midnights of admission.   * I certify that at the point of admission it is my clinical judgment that the patient will require inpatient hospital care spanning beyond 2 midnights from the point of admission due to high intensity of service, high risk for further deterioration and high frequency of surveillance required.*  AuthorLonia Blood, MD 03/23/2022 8:10 PM  For on call review [email or web address].

## 2022-03-23 NOTE — ED Provider Notes (Signed)
[facility] DEPT Provider Note   CSN: [id number] Arrival date & time: 03/23/22  1000     History  Chief Complaint  Patient presents with   Sickle Cell Pain Crisis    Nicolas Rogers is a 26 y.o. male.   Sickle Cell Pain Crisis    26 year old male with medical history significant for sickle cell disease who presents with concern for sickle cell pain crisis.  The patient has been lost to follow-up outpatient has not followed outpatient with the sickle cell pain clinic over the past year.  He states that he has taken Tylenol at home without any relief.  He endorses pain in his lower back in all extremities.  He denies any chest pain or shortness of breath.  He did have some shortness of breath prior to arrival which has since improved.  He denies any fevers or cough.  Home Medications Prior to Admission medications   Medication Sig Start Date End Date Taking? Authorizing Provider  ergocalciferol (VITAMIN D2) 1.25 MG (50000 UT) capsule Take 1 capsule (50,000 Units total) by mouth once a week. 03/13/19   Dorena Dew, FNP  folic acid (FOLVITE) 1 MG tablet Take 1 tablet (1 mg total) by mouth daily. 04/14/20   Dorena Dew, FNP  hydroxyurea (HYDREA) 500 MG capsule Take 2 capsules (1,000 mg total) by mouth daily. May take with food to minimize GI side effects. 04/14/20   Dorena Dew, FNP  ibuprofen (ADVIL) 800 MG tablet Take 1 tablet (800 mg total) by mouth every 8 (eight) hours as needed for moderate pain. 04/14/20   Dorena Dew, FNP  oxyCODONE (ROXICODONE) 5 MG immediate release tablet Take 1 tablet (5 mg total) by mouth every 4 (four) hours as needed for severe pain. 04/14/20   Dorena Dew, FNP      Allergies    Patient has no known allergies.    Review of Systems   Review of Systems  All other systems reviewed and are negative.   Physical Exam Updated Vital Signs BP 103/69   Pulse 66   Temp 98.1 F (36.7 C) (Oral)   Resp  16   Ht 6\' 1"  (1.854 m)   Wt 75 kg   SpO2 96%   BMI 21.81 kg/m  Physical Exam Vitals and nursing note reviewed.  Constitutional:      General: He is not in acute distress.    Appearance: He is well-developed.  HENT:     Head: Normocephalic and atraumatic.  Eyes:     Conjunctiva/sclera: Conjunctivae normal.  Cardiovascular:     Rate and Rhythm: Normal rate and regular rhythm.     Heart sounds: No murmur heard. Pulmonary:     Effort: Pulmonary effort is normal. No respiratory distress.     Breath sounds: Normal breath sounds.  Abdominal:     Palpations: Abdomen is soft.     Tenderness: There is no abdominal tenderness.  Musculoskeletal:        General: No swelling.     Cervical back: Neck supple.  Skin:    General: Skin is warm and dry.     Capillary Refill: Capillary refill takes less than 2 seconds.  Neurological:     Mental Status: He is alert.  Psychiatric:        Mood and Affect: Mood normal.     ED Results / Procedures / Treatments   Labs (all labs ordered are listed, but only abnormal results are displayed)  Labs Reviewed  CBC WITH DIFFERENTIAL/PLATELET - Abnormal; Notable for the following components:      Result Value   WBC 11.9 (*)    RBC 2.68 (*)    Hemoglobin 6.8 (*)    HCT 19.9 (*)    MCV 74.3 (*)    MCH 25.4 (*)    RDW 27.9 (*)    Platelets 460 (*)    nRBC 0.6 (*)    Eosinophils Absolute 1.0 (*)    Abs Immature Granulocytes 0.36 (*)    All other components within normal limits  RETICULOCYTES - Abnormal; Notable for the following components:   Retic Ct Pct 7.1 (*)    RBC. 2.65 (*)    Retic Count, Absolute 188.4 (*)    Immature Retic Fract 48.2 (*)    All other components within normal limits  COMPREHENSIVE METABOLIC PANEL - Abnormal; Notable for the following components:   Chloride 112 (*)    Creatinine, Ser 0.49 (*)    Calcium 8.3 (*)    Total Bilirubin 4.8 (*)    Anion gap 3 (*)    All other components within normal limits    EKG EKG  Interpretation  Date/Time:  Wednesday March 23 2022 14:04:41 EST Ventricular Rate:  56 PR Interval:  202 QRS Duration: 103 QT Interval:  466 QTC Calculation: 450 R Axis:   52 Text Interpretation: Sinus rhythm Borderline prolonged PR interval Confirmed by Ernie Avena ([id number]) on 03/23/2022 6:40:41 PM  Radiology DG Chest Port 1 View  Result Date: 03/23/2022 CLINICAL DATA:  Shortness of breath EXAM: PORTABLE CHEST 1 VIEW COMPARISON:  Chest radiograph dated February 14, 2017 FINDINGS: The heart size and mediastinal contours are within normal limits. Both lungs are clear. The visualized skeletal structures are unremarkable. IMPRESSION: No active disease. Electronically Signed   By: Larose Hires D.O.   On: 03/23/2022 11:00    Procedures Procedures    Medications Ordered in ED Medications  influenza vac split quadrivalent PF (FLUARIX) injection 0.5 mL (has no administration in time range)  prochlorperazine (COMPAZINE) injection 10 mg (has no administration in time range)  ketorolac (TORADOL) 15 MG/ML injection 15 mg (15 mg Intravenous Given 03/23/22 1429)  HYDROmorphone (DILAUDID) injection 2 mg (2 mg Intravenous Given 03/23/22 1432)  HYDROmorphone (DILAUDID) injection 2 mg (2 mg Intravenous Given 03/23/22 1511)  HYDROmorphone (DILAUDID) injection 2 mg (2 mg Intravenous Given 03/23/22 1603)  oxyCODONE (Oxy IR/ROXICODONE) immediate release tablet 15 mg (15 mg Oral Given 03/23/22 1606)  ondansetron (ZOFRAN) injection 4 mg (4 mg Intravenous Given 03/23/22 1600)  diphenhydrAMINE (BENADRYL) injection 25 mg (25 mg Intravenous Given 03/23/22 1834)    ED Course/ Medical Decision Making/ A&P Clinical Course as of 03/23/22 1841  Wed Mar 23, 2022  1516 SCC, pending sickle cell team consult [MK]    Clinical Course User Index [MK] Kommor, Wyn Forster, MD                           Medical Decision Making Risk Prescription drug management. Decision regarding hospitalization.    26 year old  male with medical history significant for sickle cell disease who presents with concern for sickle cell pain crisis.  The patient has been lost to follow-up outpatient has not followed outpatient with the sickle cell pain clinic over the past year.  He states that he has taken Tylenol at home without any relief.  He endorses pain in his lower back in all extremities.  He denies  any chest pain or shortness of breath.  He did have some shortness of breath prior to arrival which has since improved.  He denies any fevers or cough.  On arrival, the patient was vitally stable.  Presenting with concern for sickle cell pain with crisis.  Initial laboratory evaluation reveals CBC with a anemia to 6.8.  On chart review, the patient's hemoglobin appears to live around 7.  I did discuss the patient with on-call sickle cell team, will try to [place] who states that would only transfuse if patient hemoglobin drops below 6.  Plan for potential repeat assessment following opiate pain medication with plan for discharge if symptomatically improved as the patient can follow-up in the sickle cell clinic tomorrow for recheck of his labs.  If pain not improved, plan for likely admission.  A chest x-ray was performed revealed no evidence of acute chest.  Reticulocyte count elevated at 188.  Signout given to Dr. Matilde Sprang at 1500.   Final Clinical Impression(s) / ED Diagnoses Final diagnoses:  Sickle cell pain crisis [facility])    Rx / DC Orders ED Discharge Orders     None         Regan Lemming, MD 03/23/22 [phone]

## 2022-03-23 NOTE — ED Provider Triage Note (Signed)
Emergency Medicine Provider Triage Evaluation Note  Nicolas Rogers , a 26 y.o. male  was evaluated in triage.  Pt complains of sickle cell pain crisis.  He says he has pain in his head, arms, legs, and back.  He says he did have some shortness of breath which is improved since he got here.  He does this is typical for his pain crisis.  He has taken Tylenol at home without any relief.  He is not on any opiates at home.  Review of Systems  Positive:  Negative:   Physical Exam  BP (!) 140/93   Pulse 69   Temp 98.2 F (36.8 C)   Resp 20   Ht 6\' 1"  (1.854 m)   Wt 75 kg   SpO2 92%   BMI 21.81 kg/m  Gen:   Awake, no distress   Resp:  Normal effort  MSK:   Moves extremities without difficulty  Other:    Medical Decision Making  Medically screening exam initiated at 10:46 AM.  Appropriate orders placed.  Gentle Hoge was informed that the remainder of the evaluation will be completed by another provider, this initial triage assessment does not replace that evaluation, and the importance of remaining in the ED until their evaluation is complete.     Bebe Liter, PA-C 03/23/22 1046

## 2022-03-23 NOTE — ED Notes (Signed)
Pt sleeping in room, on 4L oxygen due to high amount of pain mediation received

## 2022-03-24 DIAGNOSIS — D5700 Hb-SS disease with crisis, unspecified: Principal | ICD-10-CM

## 2022-03-24 LAB — COMPREHENSIVE METABOLIC PANEL
ALT: 26 U/L (ref 0–44)
AST: 48 U/L — ABNORMAL HIGH (ref 15–41)
Albumin: 3.4 g/dL — ABNORMAL LOW (ref 3.5–5.0)
Alkaline Phosphatase: 39 U/L (ref 38–126)
Anion gap: 3 — ABNORMAL LOW (ref 5–15)
BUN: 6 mg/dL (ref 6–20)
CO2: 25 mmol/L (ref 22–32)
Calcium: 8.1 mg/dL — ABNORMAL LOW (ref 8.9–10.3)
Chloride: 113 mmol/L — ABNORMAL HIGH (ref 98–111)
Creatinine, Ser: 0.81 mg/dL (ref 0.61–1.24)
GFR, Estimated: >60 mL/min (ref >60)
Glucose, Bld: 90 mg/dL (ref 70–99)
Potassium: 3.6 mmol/L (ref 3.5–5.1)
Sodium: 141 mmol/L (ref 135–145)
Total Bilirubin: 5 mg/dL — ABNORMAL HIGH (ref 0.3–1.2)
Total Protein: 6.8 g/dL (ref 6.5–8.1)

## 2022-03-24 LAB — CBC WITH DIFFERENTIAL/PLATELET
Abs Immature Granulocytes: 0.05 10*3/uL (ref 0.00–0.07)
Basophils Absolute: 0.1 10*3/uL (ref 0.0–0.1)
Basophils Relative: 0 %
Eosinophils Absolute: 0.4 10*3/uL (ref 0.0–0.5)
Eosinophils Relative: 3 %
HCT: 18 % — ABNORMAL LOW (ref 39.0–52.0)
Hemoglobin: 6.2 g/dL — CL (ref 13.0–17.0)
Immature Granulocytes: 0 %
Lymphocytes Relative: 33 %
Lymphs Abs: 4 10*3/uL (ref 0.7–4.0)
MCH: 25.7 pg — ABNORMAL LOW (ref 26.0–34.0)
MCHC: 34.4 g/dL (ref 30.0–36.0)
MCV: 74.7 fL — ABNORMAL LOW (ref 80.0–100.0)
Monocytes Absolute: 1 10*3/uL (ref 0.1–1.0)
Monocytes Relative: 8 %
Neutro Abs: 6.7 10*3/uL (ref 1.7–7.7)
Neutrophils Relative %: 56 %
Platelets: 405 10*3/uL — ABNORMAL HIGH (ref 150–400)
RBC: 2.41 MIL/uL — ABNORMAL LOW (ref 4.22–5.81)
RDW: 26.5 % — ABNORMAL HIGH (ref 11.5–15.5)
WBC: 12.2 10*3/uL — ABNORMAL HIGH (ref 4.0–10.5)
nRBC: 0.5 % — ABNORMAL HIGH (ref 0.0–0.2)

## 2022-03-24 LAB — PREPARE RBC (CROSSMATCH)

## 2022-03-24 LAB — HIV ANTIBODY (ROUTINE TESTING W REFLEX): HIV Screen 4th Generation wRfx: NONREACTIVE

## 2022-03-24 MED ORDER — ACETAMINOPHEN 325 MG PO TABS
650.0000 mg | ORAL_TABLET | Freq: Once | ORAL | Status: AC
  Administered 2022-03-24: 650 mg via ORAL
  Filled 2022-03-24: qty 2

## 2022-03-24 MED ORDER — SODIUM CHLORIDE 0.9% IV SOLUTION: Freq: Once | INTRAVENOUS | Status: AC

## 2022-03-24 MED ORDER — DIPHENHYDRAMINE HCL 50 MG/ML IJ SOLN
25.0000 mg | Freq: Once | INTRAMUSCULAR | Status: AC
  Administered 2022-03-24: 25 mg via INTRAVENOUS
  Filled 2022-03-24: qty 1

## 2022-03-24 NOTE — Progress Notes (Signed)
Subjective: Nicolas Rogers is a 26 year old male with a medical history significant for sickle cell disease and anemia of chronic disease was admitted for sickle cell pain crisis.  Patient is complaining of pain primarily to upper and lower extremities.  However, pain is worse in his left arm.  He rates his pain as 8/10. Today, patient's hemoglobin is 6.2 g/dL, which appears to be slightly below his baseline. He denies headache, shortness of breath, chest pain, urinary symptoms, nausea, vomiting, or diarrhea.  Objective:  Vital signs in last 24 hours:  Vitals:   03/24/22 0105 03/24/22 0332 03/24/22 0418 03/24/22 0838  BP: 105/68  103/69   Pulse: (!) 59  (!) 59   Resp: 14 16 14 12   Temp: 97.8 F (36.6 C)  98.2 F (36.8 C)   TempSrc: Oral  Oral   SpO2: 99% 96% 99% 98%  Weight:      Height:        Intake/Output from previous day:   Intake/Output Summary (Last 24 hours) at 03/24/2022 0841 Last data filed at 03/24/2022 0300 Gross per 24 hour  Intake 671.39 ml  Output --  Net 671.39 ml    Physical Exam: General: Alert, awake, oriented x3, in no acute distress.  HEENT: [place]/AT PEERL, EOMI Neck: Trachea midline,  no masses, no thyromegal,y no JVD, no carotid bruit OROPHARYNX:  Moist, No exudate/ erythema/lesions.  Heart: Regular rate and rhythm, without murmurs, rubs, gallops, PMI non-displaced, no heaves or thrills on palpation.  Lungs: Clear to auscultation, no wheezing or rhonchi noted. No increased vocal fremitus resonant to percussion  Abdomen: Soft, nontender, nondistended, positive bowel sounds, no masses no hepatosplenomegaly noted..  Neuro: No focal neurological deficits noted cranial nerves II through XII grossly intact. DTRs 2+ bilaterally upper and lower extremities. Strength 5 out of 5 in bilateral upper and lower extremities. Musculoskeletal: No warm swelling or erythema around joints, no spinal tenderness noted. Psychiatric: Patient alert and oriented x3, good  insight and cognition, good recent to remote recall. Lymph node survey: No cervical axillary or inguinal lymphadenopathy noted.  Lab Results:  Basic Metabolic Panel:    Component Value Date/Time   NA 141 03/24/2022 0611   NA 132 (L) 04/21/2020 1331   K 3.6 03/24/2022 0611   CL 113 (H) 03/24/2022 0611   CO2 25 03/24/2022 0611   BUN 6 03/24/2022 0611   BUN 5 (L) 04/21/2020 1331   CREATININE 0.81 03/24/2022 0611   CREATININE 0.72 04/29/2016 1351   GLUCOSE 90 03/24/2022 0611   CALCIUM 8.1 (L) 03/24/2022 0611   CBC:    Component Value Date/Time   WBC 12.2 (H) 03/24/2022 0611   HGB 6.2 (LL) 03/24/2022 0611   HGB 7.1 (L) 04/21/2020 1331   HCT 18.0 (L) 03/24/2022 0611   HCT 21.4 (L) 04/21/2020 1331   PLT 405 (H) 03/24/2022 0611   PLT 417 04/21/2020 1331   MCV 74.7 (L) 03/24/2022 0611   MCV 78 (L) 04/21/2020 1331   NEUTROABS 6.7 03/24/2022 0611   NEUTROABS 5.0 04/21/2020 1331   LYMPHSABS 4.0 03/24/2022 0611   LYMPHSABS 5.0 (H) 04/21/2020 1331   MONOABS 1.0 03/24/2022 0611   EOSABS 0.4 03/24/2022 0611   EOSABS 0.9 (H) 04/21/2020 1331   BASOSABS 0.1 03/24/2022 0611   BASOSABS 0.1 04/21/2020 1331    No results found for this or any previous visit (from the past 240 hour(s)).  Studies/Results: DG Chest Port 1 View  Result Date: 03/23/2022 CLINICAL DATA:  Shortness of breath EXAM:  PORTABLE CHEST 1 VIEW COMPARISON:  Chest radiograph dated February 14, 2017 FINDINGS: The heart size and mediastinal contours are within normal limits. Both lungs are clear. The visualized skeletal structures are unremarkable. IMPRESSION: No active disease. Electronically Signed   By: Keane Police D.O.   On: 03/23/2022 11:00    Medications: Scheduled Meds:  sodium chloride   Intravenous Once   acetaminophen  650 mg Oral Once   diphenhydrAMINE  25 mg Intravenous Once   enoxaparin (LOVENOX) injection  40 mg Subcutaneous QHS   folic acid  1 mg Oral Daily   HYDROmorphone   Intravenous Q4H    hydroxyurea  1,000 mg Oral Daily   influenza vac split quadrivalent PF  0.5 mL Intramuscular Tomorrow-1000   ketorolac  15 mg Intravenous Q6H   senna-docusate  1 tablet Oral BID   Continuous Infusions:  dextrose 5 % and 0.45% NaCl 125 mL/hr at 03/24/22 0509   PRN Meds:.diphenhydrAMINE, naloxone **AND** sodium chloride flush, ondansetron (ZOFRAN) IV, polyethylene glycol  Consultants: none  Procedures: none  Antibiotics: none  Assessment/Plan: Principal Problem:   Sickle cell crisis (HCC) Active Problems:   Anxiety, generalized   Sickle cell anemia (HCC)  Sickle cell disease with pain crisis: Decrease IV fluids to KVO Continue IV Dilaudid PCA without changes Toradol 15 mg IV every 6 hours Oxycodone 5 mg every 4 hours as needed for severe breakthrough pain Monitor vital signs very closely, reevaluate pain scale regularly, and supplemental oxygen as needed  Anemia of chronic disease: Hemoglobin is 6.2 g/dL.  Transfuse 1 unit PRBCs.  Follow labs in AM.  Leukocytosis: WBC slightly elevated at 12.2.  Patient afebrile.  No signs of active infection.  More than likely reactive.  Continue to monitor closely.  Labs in AM.  Hyperbilirubinemia: Bilirubin elevated at 5.0.  Scleral icterus.  Secondary to sickle cell disease.  Hepatic function panel in AM. Code Status: Full Code Family Communication: N/A Disposition Plan: Not yet ready for discharge  [place], MSN, FNP-C Patient [facility] [address] [place], [place] [address] [phone]  If 7PM-7AM, please contact night-coverage.  03/24/2022, 8:41 AM  LOS: 1 day

## 2022-03-24 NOTE — Plan of Care (Signed)

## 2022-03-25 LAB — CBC WITH DIFFERENTIAL/PLATELET
Abs Immature Granulocytes: 0.05 10*3/uL (ref 0.00–0.07)
Basophils Absolute: 0.1 10*3/uL (ref 0.0–0.1)
Basophils Relative: 0 %
Eosinophils Absolute: 0.6 10*3/uL — ABNORMAL HIGH (ref 0.0–0.5)
Eosinophils Relative: 5 %
HCT: 20.3 % — ABNORMAL LOW (ref 39.0–52.0)
Hemoglobin: 7.1 g/dL — ABNORMAL LOW (ref 13.0–17.0)
Immature Granulocytes: 0 %
Lymphocytes Relative: 31 %
Lymphs Abs: 3.5 10*3/uL (ref 0.7–4.0)
MCH: 26.1 pg (ref 26.0–34.0)
MCHC: 35 g/dL (ref 30.0–36.0)
MCV: 74.6 fL — ABNORMAL LOW (ref 80.0–100.0)
Monocytes Absolute: 0.8 10*3/uL (ref 0.1–1.0)
Monocytes Relative: 7 %
Neutro Abs: 6.2 10*3/uL (ref 1.7–7.7)
Neutrophils Relative %: 57 %
Platelets: 395 10*3/uL (ref 150–400)
RBC: 2.72 MIL/uL — ABNORMAL LOW (ref 4.22–5.81)
RDW: 25.6 % — ABNORMAL HIGH (ref 11.5–15.5)
WBC: 11.3 10*3/uL — ABNORMAL HIGH (ref 4.0–10.5)
nRBC: 0.2 % (ref 0.0–0.2)

## 2022-03-25 LAB — BASIC METABOLIC PANEL
Anion gap: 4 — ABNORMAL LOW (ref 5–15)
BUN: 7 mg/dL (ref 6–20)
CO2: 24 mmol/L (ref 22–32)
Calcium: 8 mg/dL — ABNORMAL LOW (ref 8.9–10.3)
Chloride: 111 mmol/L (ref 98–111)
Creatinine, Ser: 0.78 mg/dL (ref 0.61–1.24)
GFR, Estimated: >60 mL/min (ref >60)
Glucose, Bld: 92 mg/dL (ref 70–99)
Potassium: 3.8 mmol/L (ref 3.5–5.1)
Sodium: 139 mmol/L (ref 135–145)

## 2022-03-25 LAB — RETICULOCYTES
Immature Retic Fract: 32.5 % — ABNORMAL HIGH (ref 2.3–15.9)
RBC.: 2.75 MIL/uL — ABNORMAL LOW (ref 4.22–5.81)
Retic Count, Absolute: 270.5 10*3/uL — ABNORMAL HIGH (ref 19.0–186.0)
Retic Ct Pct: 9.8 % — ABNORMAL HIGH (ref 0.4–3.1)

## 2022-03-25 LAB — PREPARE RBC (CROSSMATCH)

## 2022-03-25 MED ORDER — SODIUM CHLORIDE 0.9% IV SOLUTION: Freq: Once | INTRAVENOUS | Status: AC

## 2022-03-25 MED ORDER — ACETAMINOPHEN 325 MG PO TABS
650.0000 mg | ORAL_TABLET | Freq: Once | ORAL | Status: AC
  Administered 2022-03-25: 650 mg via ORAL
  Filled 2022-03-25: qty 2

## 2022-03-25 MED ORDER — DIPHENHYDRAMINE HCL 50 MG/ML IJ SOLN
25.0000 mg | Freq: Once | INTRAMUSCULAR | Status: AC
  Administered 2022-03-25: 25 mg via INTRAVENOUS
  Filled 2022-03-25: qty 1

## 2022-03-25 NOTE — Progress Notes (Signed)
Subjective: Nicolas Rogers is a 26 year old male with a medical history significant for sickle cell disease and anemia of chronic disease was admitted for sickle cell pain crisis. Today, patient's hemoglobin has increased slightly.  It is 7.1 g/dL, which is below patient's baseline.  He is status post 1 unit PRBCs on yesterday. Patient says that pain intensity has improved some.  He rates his pain as 5/10.  Pain is primarily to his upper and lower extremities. He denies headache, shortness of breath, chest pain, urinary symptoms, nausea, vomiting, or diarrhea.  Objective:  Vital signs in last 24 hours:  Vitals:   03/25/22 0213 03/25/22 0413 03/25/22 0845 03/25/22 1011  BP: 109/77   114/81  Pulse: 63   (!) 59  Resp: 16 14 16 18   Temp: 99.3 F (37.4 C)   98.6 F (37 C)  TempSrc: Oral   Oral  SpO2: 98% 96% 97% 99%  Weight:      Height:        Intake/Output from previous day:   Intake/Output Summary (Last 24 hours) at 03/25/2022 1235 Last data filed at 03/25/2022 0518 Gross per 24 hour  Intake 3469.5 ml  Output --  Net 3469.5 ml    Physical Exam: General: Alert, awake, oriented x3, in no acute distress.  HEENT: [place]/AT PEERL, EOMI.  Scleral icterus Neck: Trachea midline,  no masses, no thyromegal,y no JVD, no carotid bruit OROPHARYNX:  Moist, No exudate/ erythema/lesions.  Heart: Regular rate and rhythm, without murmurs, rubs, gallops, PMI non-displaced, no heaves or thrills on palpation.  Lungs: Clear to auscultation, no wheezing or rhonchi noted. No increased vocal fremitus resonant to percussion  Abdomen: Soft, nontender, nondistended, positive bowel sounds, no masses no hepatosplenomegaly noted..  Neuro: No focal neurological deficits noted cranial nerves II through XII grossly intact. DTRs 2+ bilaterally upper and lower extremities. Strength 5 out of 5 in bilateral upper and lower extremities. Musculoskeletal: No warm swelling or erythema around joints, no spinal tenderness  noted. Psychiatric: Patient alert and oriented x3, good insight and cognition, good recent to remote recall. Lymph node survey: No cervical axillary or inguinal lymphadenopathy noted.  Lab Results:  Basic Metabolic Panel:    Component Value Date/Time   NA 139 03/25/2022 1048   NA 132 (L) 04/21/2020 1331   K 3.8 03/25/2022 1048   CL 111 03/25/2022 1048   CO2 24 03/25/2022 1048   BUN 7 03/25/2022 1048   BUN 5 (L) 04/21/2020 1331   CREATININE 0.78 03/25/2022 1048   CREATININE 0.72 04/29/2016 1351   GLUCOSE 92 03/25/2022 1048   CALCIUM 8.0 (L) 03/25/2022 1048   CBC:    Component Value Date/Time   WBC 11.3 (H) 03/25/2022 1048   HGB 7.1 (L) 03/25/2022 1048   HGB 7.1 (L) 04/21/2020 1331   HCT 20.3 (L) 03/25/2022 1048   HCT 21.4 (L) 04/21/2020 1331   PLT 395 03/25/2022 1048   PLT 417 04/21/2020 1331   MCV 74.6 (L) 03/25/2022 1048   MCV 78 (L) 04/21/2020 1331   NEUTROABS 6.2 03/25/2022 1048   NEUTROABS 5.0 04/21/2020 1331   LYMPHSABS 3.5 03/25/2022 1048   LYMPHSABS 5.0 (H) 04/21/2020 1331   MONOABS 0.8 03/25/2022 1048   EOSABS 0.6 (H) 03/25/2022 1048   EOSABS 0.9 (H) 04/21/2020 1331   BASOSABS 0.1 03/25/2022 1048   BASOSABS 0.1 04/21/2020 1331    No results found for this or any previous visit (from the past 240 hour(s)).  Studies/Results: No results found.  Medications: Scheduled  Meds:  sodium chloride   Intravenous Once   acetaminophen  650 mg Oral Once   diphenhydrAMINE  25 mg Intravenous Once   enoxaparin (LOVENOX) injection  40 mg Subcutaneous QHS   folic acid  1 mg Oral Daily   HYDROmorphone   Intravenous Q4H   hydroxyurea  1,000 mg Oral Daily   influenza vac split quadrivalent PF  0.5 mL Intramuscular Tomorrow-1000   ketorolac  15 mg Intravenous Q6H   senna-docusate  1 tablet Oral BID   Continuous Infusions:  dextrose 5 % and 0.45% NaCl 125 mL/hr at 03/25/22 0518   PRN Meds:.diphenhydrAMINE, naloxone **AND** sodium chloride flush, ondansetron (ZOFRAN)  IV, polyethylene glycol  Consultants: none  Procedures: none  Antibiotics: none  Assessment/Plan: Principal Problem:   Sickle cell crisis (HCC) Active Problems:   Anxiety, generalized   Sickle cell anemia (HCC)  Sickle cell disease with pain crisis: Discontinue IV Dilaudid PCA  Toradol 15 mg IV every 6 hours Oxycodone 5 mg every 4 hours as needed for severe breakthrough pain Monitor vital signs very closely, reevaluate pain scale regularly, and supplemental oxygen as needed  Anemia of chronic disease: Hemoglobin is 7.1 g/dL.  Patient is status post 1 unit PRBCs on yesterday, will transfuse an additional unit today. Follow labs in AM.  Leukocytosis: Stable.  Patient afebrile.  No signs of active infection.  More than likely reactive.  Continue to monitor closely.  Labs in AM.  Hyperbilirubinemia: Bilirubin elevated at 5.0.  Scleral icterus.  Secondary to sickle cell disease.  Hepatic function panel in AM.   Code Status: Full Code Family Communication: N/A Disposition Plan: Not yet ready for discharge.  Discharge planned for 03/26/2022  Nolon Nations  APRN, MSN, FNP-C Patient Care [facility] [address] [place], [place] [address] [phone]  If 7PM-7AM, please contact night-coverage.  03/25/2022, 12:35 PM  LOS: 2 days

## 2022-03-25 NOTE — TOC Initial Note (Addendum)
Transition of Care [facility]) - Initial/Assessment Note    Patient Details  Name: Nicolas Rogers MRN: [id number] Date of Birth: 24-Jun-1995  Transition of Care [facility]) CM/SW Contact:    Beckie Busing, RN Phone Number:[phone]  03/25/2022, 1:09 PM  Clinical Narrative:                 [facility] acknowledges consult for  problems with food, housing and utilities in the past year & trouble paying for medications. CM at bedside to discuss food and housing with patient. CM to add resources to AVS. CM asked patient about access to medications. Per patient he gets meds from [facility] and self pays. CM inquired if patient has insurance and patient answers no. Per patient he had medicaid but did not  re certify at time for renewal and medicaid was stopped. Patient acknowledges that he needs to go to department of social services for [facility] re certification. CM has advices patient to set up appointment to follow up. No other needs noted at this time.   1315 Resources have been added to avs       Patient Goals and CMS Choice        Expected Discharge Plan and Services                                                Prior Living Arrangements/Services                       Activities of Daily Living Home Assistive Devices/Equipment: None ADL Screening (condition at time of admission) Patient's cognitive ability adequate to safely complete daily activities?: Yes Is the patient deaf or have difficulty hearing?: No Does the patient have difficulty seeing, even when wearing glasses/contacts?: No Does the patient have difficulty concentrating, remembering, or making decisions?: No Patient able to express need for assistance with ADLs?: Yes Does the patient have difficulty dressing or bathing?: No Independently performs ADLs?: Yes (appropriate for developmental age) Does the patient have difficulty walking or climbing stairs?: No Weakness of Legs: Both Weakness of Arms/Hands:  Both  Permission Sought/Granted                  Emotional Assessment              Admission diagnosis:  Sickle cell crisis (HCC) [D57.00] Sickle cell pain crisis (HCC) [D57.00] Patient Active Problem List   Diagnosis Date Noted   Sickle cell crisis (HCC) 03/23/2022   Sickle cell anemia (HCC) 03/23/2022   Avascular necrosis of right femoral head (HCC) 12/04/2018   Anxiety, generalized 09/01/2016   Visual hallucinations 09/01/2016   Auditory hallucinations 09/01/2016   Hb-SS disease with acute chest syndrome (HCC) 08/29/2016   CAP (community acquired pneumonia) 08/29/2016   Hb-SS disease with crisis (HCC) 08/23/2016   Vitamin D deficiency 03/12/2014   Need for immunization against influenza 03/12/2014   Hb-SS disease without crisis (HCC) 01/28/2013   PCP:  Massie Maroon, FNP Pharmacy:   [facility] [phone] - [place], [place] - [address] [place] [address] Phone: [phone] Fax: [phone]     Social Determinants of Health (SDOH) Interventions    Readmission Risk Interventions     No data to display

## 2022-03-26 LAB — CBC
HCT: 21.7 % — ABNORMAL LOW (ref 39.0–52.0)
Hemoglobin: 7.5 g/dL — ABNORMAL LOW (ref 13.0–17.0)
MCH: 26.1 pg (ref 26.0–34.0)
MCHC: 34.6 g/dL (ref 30.0–36.0)
MCV: 75.6 fL — ABNORMAL LOW (ref 80.0–100.0)
Platelets: 369 10*3/uL (ref 150–400)
RBC: 2.87 MIL/uL — ABNORMAL LOW (ref 4.22–5.81)
RDW: 26.3 % — ABNORMAL HIGH (ref 11.5–15.5)
WBC: 14.2 10*3/uL — ABNORMAL HIGH (ref 4.0–10.5)
nRBC: 0.4 % — ABNORMAL HIGH (ref 0.0–0.2)

## 2022-03-26 LAB — HEPATIC FUNCTION PANEL
ALT: 28 U/L (ref 0–44)
AST: 40 U/L (ref 15–41)
Albumin: 3.2 g/dL — ABNORMAL LOW (ref 3.5–5.0)
Alkaline Phosphatase: 47 U/L (ref 38–126)
Bilirubin, Direct: 0.4 mg/dL — ABNORMAL HIGH (ref 0.0–0.2)
Indirect Bilirubin: 3.6 mg/dL — ABNORMAL HIGH (ref 0.3–0.9)
Total Bilirubin: 4 mg/dL — ABNORMAL HIGH (ref 0.3–1.2)
Total Protein: 6.8 g/dL (ref 6.5–8.1)

## 2022-03-26 LAB — LACTATE DEHYDROGENASE: LDH: 432 U/L — ABNORMAL HIGH (ref 98–192)

## 2022-03-26 MED ORDER — OXYCODONE HCL 5 MG PO TABS: 5.0000 mg | ORAL_TABLET | ORAL | 0 refills | Status: AC | PRN

## 2022-03-26 NOTE — Discharge Summary (Signed)
Physician Discharge Summary   Patient: Nicolas Rogers MRN: [id number] DOB: 1996-01-10  Admit date:     03/23/2022  Discharge date: 03/26/2022  Discharge Physician: Lonia Blood   PCP: Massie Maroon, FNP   Recommendations at discharge:   Follow-up with PCP.  Prescriptions given. Patient recommended for home O2 due to oxygen desaturation but he declined.  Discharge Diagnoses: Principal Problem:   Sickle cell crisis (HCC) Active Problems:   Anxiety, generalized   Sickle cell anemia (HCC)  Resolved Problems:   * No resolved hospital problems. *  Hospital Course:  Nicolas Rogers is a 26 y.o. male with medical history significant of sickle cell disease, schizophrenia, major depressive disorder, history of cholecystolithiasis, avascular necrosis among other things who has not been having his recurrent sickle cell crisis here now with pain in his back and legs consistent with his typical sickle cell crisis.  Patient has been taking ibuprofen and oxycodone at home but has run out. He only took Tylenol.  Patient was seen in the ER.  He received a number of doses of Dilaudid but still having no significant relief.  Patient will therefore be admitted to the hospital for further evaluation and treatment   Patient was admitted and initiated on IV Dilaudid PCA, some Toradol, oral pain medication.  Supportive care.  Patient continues to be having significant pain which later improved.  He however developed hypoxia which apparently was chronic.  He was requiring up to 2 L of oxygen.  His pain has improved down to 3 out of 10.  Patient was not taking any significant medications.  Started on 5 mg of oxycodone as needed.  Prescription given for 20 tablets to start with.  He is not a frequent flyer and has not been having frequent attacks.  Patient however advised to have follow-up with PCP as soon as possible.  He was also recommended to have home oxygen due to his hypoxia but he declined any suggestions  of oxygen.  As a result patient was discharged home to follow-up with PCP.  He said his hypoxia is chronic and he does not want to do anything new.  Assessment and Plan: No notes have been filed under this hospital service. Service: Hospitalist       Pain control - [employer] Controlled Substance Reporting System database was reviewed. and patient was instructed, not to drive, operate heavy machinery, perform activities at heights, swimming or participation in water activities or provide baby-sitting services while on Pain, Sleep and Anxiety Medications; until their outpatient Physician has advised to do so again. Also recommended to not to take more than prescribed Pain, Sleep and Anxiety Medications.  Consultants: None Procedures performed: Chest x-ray Disposition: Home Diet recommendation:  Regular diet DISCHARGE MEDICATION: Allergies as of 03/26/2022   No Known Allergies      Medication List     TAKE these medications    acetaminophen 500 MG tablet Commonly known as: TYLENOL Take 1,000 mg by mouth every 6 (six) hours as needed for mild pain.   folic acid 1 MG tablet Commonly known as: FOLVITE Take 1 tablet (1 mg total) by mouth daily.   hydroxyurea 500 MG capsule Commonly known as: HYDREA Take 2 capsules (1,000 mg total) by mouth daily. May take with food to minimize GI side effects.   oxyCODONE 5 MG immediate release tablet Commonly known as: Roxicodone Take 1 tablet (5 mg total) by mouth every 4 (four) hours as needed for severe pain.  Discharge Exam: Filed Weights   03/23/22 1036  Weight: 75 kg   Constitutional: NAD, calm, comfortable Eyes: PERRL, lids and conjunctivae normal ENMT: Mucous membranes are moist. Posterior pharynx clear of any exudate or lesions.Normal dentition.  Neck: normal, supple, no masses, no thyromegaly Respiratory: clear to auscultation bilaterally, no wheezing, no crackles. Normal respiratory effort. No accessory muscle  use.  Cardiovascular: Regular rate and rhythm, no murmurs / rubs / gallops. No extremity edema. 2+ pedal pulses. No carotid bruits.  Abdomen: no tenderness, no masses palpated. No hepatosplenomegaly. Bowel sounds positive.  Musculoskeletal: Good range of motion, no joint swelling or tenderness, Skin: no rashes, lesions, ulcers. No induration Neurologic: CN 2-12 grossly intact. Sensation intact, DTR normal. Strength 5/5 in all 4.  Psychiatric: Normal judgment and insight. Alert and oriented x 3. Normal mood   Condition at discharge: good  The results of significant diagnostics from this hospitalization (including imaging, microbiology, ancillary and laboratory) are listed below for reference.   Imaging Studies: DG Chest Port 1 View  Result Date: 03/23/2022 CLINICAL DATA:  Shortness of breath EXAM: PORTABLE CHEST 1 VIEW COMPARISON:  Chest radiograph dated February 14, 2017 FINDINGS: The heart size and mediastinal contours are within normal limits. Both lungs are clear. The visualized skeletal structures are unremarkable. IMPRESSION: No active disease. Electronically Signed   By: Larose Hires D.O.   On: 03/23/2022 11:00    Microbiology: Results for orders placed or performed during the hospital encounter of 08/24/16  Culture, blood (routine x 2)     Status: None   Collection Time: 08/24/16 11:28 PM   Specimen: BLOOD  Result Value Ref Range Status   Specimen Description BLOOD RIGHT HAND  Final   Special Requests   Final    BOTTLES DRAWN AEROBIC AND ANAEROBIC Blood Culture adequate volume   Culture   Final    NO GROWTH 5 DAYS Performed at [facility], [address], [place], [place] [address]    Report Status 08/30/2016 FINAL  Final  Culture, blood (routine x 2)     Status: None   Collection Time: 08/24/16 11:28 PM   Specimen: BLOOD  Result Value Ref Range Status   Specimen Description BLOOD LEFT ANTECUBITAL  Final   Special Requests   Final    BOTTLES DRAWN AEROBIC AND  ANAEROBIC Blood Culture results may not be optimal due to an excessive volume of blood received in culture bottles   Culture   Final    NO GROWTH 5 DAYS Performed at [facility], [address], [place], [place] [address]    Report Status 08/30/2016 FINAL  Final  Culture, blood (routine x 2) Call MD if unable to obtain prior to antibiotics being given     Status: None   Collection Time: 08/28/16  4:57 PM   Specimen: BLOOD  Result Value Ref Range Status   Specimen Description BLOOD RIGHT HAND  Final   Special Requests IN PEDIATRIC BOTTLE BCAV  Final   Culture   Final    NO GROWTH 5 DAYS Performed at [facility], [address] [place], [place] [address]    Report Status 09/02/2016 FINAL  Final  Culture, blood (routine x 2) Call MD if unable to obtain prior to antibiotics being given     Status: None   Collection Time: 08/28/16  5:04 PM   Specimen: BLOOD  Result Value Ref Range Status   Specimen Description BLOOD LEFT HAND  Final  Special Requests IN PEDIATRIC BOTTLE BCAV  Final   Culture   Final    NO GROWTH 5 DAYS Performed at [facility], [address], [place], [place] [address]    Report Status 09/02/2016 FINAL  Final    Labs: CBC: Recent Labs  Lab 03/23/22 1046 03/24/22 0611 03/25/22 1048 03/26/22 0606  WBC 11.9* 12.2* 11.3* 14.2*  NEUTROABS 5.9 6.7 6.2  --   HGB 6.8* 6.2* 7.1* 7.5*  HCT 19.9* 18.0* 20.3* 21.7*  MCV 74.3* 74.7* 74.6* 75.6*  PLT 460* 405* 395 369   Basic Metabolic Panel: Recent Labs  Lab 03/23/22 1046 03/24/22 0611 03/25/22 1048  NA 140 141 139  K 3.8 3.6 3.8  CL 112* 113* 111  CO2 25 25 24   GLUCOSE 88 90 92  BUN 6 6 7   CREATININE 0.49* 0.81 0.78  CALCIUM 8.3* 8.1* 8.0*   Liver Function Tests: Recent Labs  Lab 03/23/22 1046 03/24/22 0611 03/26/22 0606  AST 37 48* 40  ALT 14 26 28   ALKPHOS 45 39 47  BILITOT 4.8* 5.0* 4.0*  PROT 8.0 6.8 6.8  ALBUMIN 3.9 3.4* 3.2*   CBG: No results for input(s):  "GLUCAP" in the last 168 hours.  Discharge time spent: greater than 30 minutes.  Signed11/18/23, MD [facility] 03/26/2022

## 2022-03-26 NOTE — Progress Notes (Signed)
Patient currently on 2.5 L with discharge orders. Patient drops to 85% sitting in bed on room air. Informed MD. Instructed to reach out to case management. Told patient that we have possible oxygen needs for him pending his walking O2 study. Patient declined oxygen. States that his O2 is normally low in the 80's. Informed MD. Discharging home with brother.

## 2022-03-26 NOTE — Hospital Course (Signed)
Nicolas Rogers is a 26 y.o. male with medical history significant of sickle cell disease, schizophrenia, major depressive disorder, history of cholecystolithiasis, avascular necrosis among other things who has not been having his recurrent sickle cell crisis here now with pain in his back and legs consistent with his typical sickle cell crisis.  Patient has been taking ibuprofen and oxycodone at home but has run out. He only took Tylenol.  Patient was seen in the ER.  He received a number of doses of Dilaudid but still having no significant relief.  Patient will therefore be admitted to the hospital for further evaluation and treatment   Patient was admitted and initiated on IV Dilaudid PCA, some Toradol, oral pain medication.  Supportive care.  Patient continues to be having significant pain which later improved.  He however developed hypoxia which apparently was chronic.  He was requiring up to 2 L of oxygen.  His pain has improved down to 3 out of 10.  Patient was not taking any significant medications.  Started on 5 mg of oxycodone as needed.  Prescription given for 20 tablets to start with.  He is not a frequent flyer and has not been having frequent attacks.  Patient however advised to have follow-up with PCP as soon as possible.  He was also recommended to have home oxygen due to his hypoxia but he declined any suggestions of oxygen.  As a result patient was discharged home to follow-up with PCP.  He said his hypoxia is chronic and he does not want to do anything new.

## 2022-03-28 LAB — BPAM RBC
Blood Product Expiration Date: 202311302359
Blood Product Expiration Date: 202312222359
ISSUE DATE / TIME: 202311161319
ISSUE DATE / TIME: 202311171325
Unit Type and Rh: 5100
Unit Type and Rh: 9500

## 2022-03-28 LAB — TYPE AND SCREEN
ABO/RH(D): O POS
Antibody Screen: NEGATIVE
Unit division: 0
Unit division: 0

## 2022-07-08 ENCOUNTER — Telehealth: Payer: Self-pay

## 2022-07-11 ENCOUNTER — Telehealth: Payer: Self-pay

## 2022-07-11 NOTE — Telephone Encounter (Signed)
Darsye called concerning pt medical records. Not sure if she was sending them or needing them. LVM advising her to call back. [place]

## 2022-07-11 NOTE — Telephone Encounter (Signed)
Done. Nicolas Rogers
